# Patient Record
Sex: Female | Born: 1970 | Race: Black or African American | Hispanic: No | Marital: Single | Smoking: Former smoker
Health system: Southern US, Community
[De-identification: ages and names within clinical notes are randomized; demographics above are authoritative.]

## PROBLEM LIST (undated history)

## (undated) DIAGNOSIS — N938 Other specified abnormal uterine and vaginal bleeding: Secondary | ICD-10-CM

## (undated) DIAGNOSIS — I10 Essential (primary) hypertension: Secondary | ICD-10-CM

## (undated) DIAGNOSIS — Z800 Family history of malignant neoplasm of digestive organs: Secondary | ICD-10-CM

## (undated) DIAGNOSIS — Z923 Personal history of irradiation: Secondary | ICD-10-CM

## (undated) DIAGNOSIS — F419 Anxiety disorder, unspecified: Secondary | ICD-10-CM

## (undated) DIAGNOSIS — F32A Depression, unspecified: Secondary | ICD-10-CM

## (undated) DIAGNOSIS — Z9221 Personal history of antineoplastic chemotherapy: Secondary | ICD-10-CM

## (undated) DIAGNOSIS — F329 Major depressive disorder, single episode, unspecified: Secondary | ICD-10-CM

## (undated) HISTORY — DX: Depression, unspecified: F32.A

## (undated) HISTORY — DX: Anxiety disorder, unspecified: F41.9

## (undated) HISTORY — PX: LAPAROSCOPIC GASTRIC BANDING: SHX1100

## (undated) HISTORY — PX: FOOT SURGERY: SHX648

## (undated) HISTORY — PX: BREAST LUMPECTOMY: SHX2

## (undated) HISTORY — PX: WISDOM TOOTH EXTRACTION: SHX21

## (undated) HISTORY — PX: BREAST SURGERY: SHX581

## (undated) HISTORY — DX: Essential (primary) hypertension: I10

## (undated) HISTORY — DX: Family history of malignant neoplasm of digestive organs: Z80.0

## (undated) HISTORY — DX: Other specified abnormal uterine and vaginal bleeding: N93.8

---
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
## 1998-04-27 ENCOUNTER — Emergency Department (HOSPITAL_COMMUNITY): Admission: EM | Admit: 1998-04-27 | Discharge: 1998-04-27 | Payer: Self-pay | Admitting: Emergency Medicine

## 1999-02-03 ENCOUNTER — Ambulatory Visit (HOSPITAL_BASED_OUTPATIENT_CLINIC_OR_DEPARTMENT_OTHER): Admission: RE | Admit: 1999-02-03 | Discharge: 1999-02-03 | Payer: Self-pay | Admitting: Surgery

## 2001-05-11 ENCOUNTER — Other Ambulatory Visit: Admission: RE | Admit: 2001-05-11 | Discharge: 2001-05-11 | Payer: Self-pay | Admitting: Obstetrics and Gynecology

## 2002-06-01 ENCOUNTER — Other Ambulatory Visit: Admission: RE | Admit: 2002-06-01 | Discharge: 2002-06-01 | Payer: Self-pay | Admitting: Obstetrics and Gynecology

## 2003-07-12 ENCOUNTER — Other Ambulatory Visit: Admission: RE | Admit: 2003-07-12 | Discharge: 2003-07-12 | Payer: Self-pay | Admitting: Obstetrics and Gynecology

## 2004-07-14 ENCOUNTER — Other Ambulatory Visit: Admission: RE | Admit: 2004-07-14 | Discharge: 2004-07-14 | Payer: Self-pay | Admitting: Obstetrics and Gynecology

## 2005-07-15 ENCOUNTER — Other Ambulatory Visit: Admission: RE | Admit: 2005-07-15 | Discharge: 2005-07-15 | Payer: Self-pay | Admitting: Obstetrics and Gynecology

## 2006-09-09 ENCOUNTER — Other Ambulatory Visit: Admission: RE | Admit: 2006-09-09 | Discharge: 2006-09-09 | Payer: Self-pay | Admitting: Obstetrics and Gynecology

## 2007-09-13 ENCOUNTER — Other Ambulatory Visit: Admission: RE | Admit: 2007-09-13 | Discharge: 2007-09-13 | Payer: Self-pay | Admitting: Obstetrics and Gynecology

## 2008-12-26 ENCOUNTER — Other Ambulatory Visit: Admission: RE | Admit: 2008-12-26 | Discharge: 2008-12-26 | Payer: Self-pay | Admitting: Obstetrics and Gynecology

## 2008-12-26 ENCOUNTER — Encounter: Payer: Self-pay | Admitting: Obstetrics and Gynecology

## 2008-12-26 ENCOUNTER — Ambulatory Visit: Payer: Self-pay | Admitting: Obstetrics and Gynecology

## 2009-02-21 ENCOUNTER — Ambulatory Visit: Payer: Self-pay | Admitting: Obstetrics and Gynecology

## 2009-03-06 ENCOUNTER — Ambulatory Visit: Payer: Self-pay | Admitting: Obstetrics and Gynecology

## 2010-02-11 ENCOUNTER — Ambulatory Visit: Payer: Self-pay | Admitting: Obstetrics and Gynecology

## 2010-02-11 ENCOUNTER — Other Ambulatory Visit: Admission: RE | Admit: 2010-02-11 | Discharge: 2010-02-11 | Payer: Self-pay | Admitting: Obstetrics and Gynecology

## 2010-05-13 ENCOUNTER — Ambulatory Visit: Payer: Self-pay | Admitting: Gynecology

## 2010-12-06 ENCOUNTER — Encounter: Payer: Self-pay | Admitting: Gynecology

## 2011-05-13 ENCOUNTER — Encounter: Payer: Self-pay | Admitting: Obstetrics and Gynecology

## 2011-05-26 ENCOUNTER — Other Ambulatory Visit: Payer: Self-pay | Admitting: Obstetrics and Gynecology

## 2011-05-26 ENCOUNTER — Other Ambulatory Visit (HOSPITAL_COMMUNITY)
Admission: RE | Admit: 2011-05-26 | Discharge: 2011-05-26 | Disposition: A | Discharge status: Home / Self Care | Payer: BC Managed Care – PPO | Source: Ambulatory Visit | Attending: Obstetrics and Gynecology | Admitting: Obstetrics and Gynecology

## 2011-05-26 ENCOUNTER — Encounter (INDEPENDENT_AMBULATORY_CARE_PROVIDER_SITE_OTHER): Payer: BC Managed Care – PPO | Admitting: Obstetrics and Gynecology

## 2011-05-26 DIAGNOSIS — R823 Hemoglobinuria: Secondary | ICD-10-CM

## 2011-05-26 DIAGNOSIS — Z13220 Encounter for screening for lipoid disorders: Secondary | ICD-10-CM

## 2011-05-26 DIAGNOSIS — Z01419 Encounter for gynecological examination (general) (routine) without abnormal findings: Secondary | ICD-10-CM

## 2011-05-26 DIAGNOSIS — Z124 Encounter for screening for malignant neoplasm of cervix: Secondary | ICD-10-CM | POA: Insufficient documentation

## 2012-04-11 ENCOUNTER — Encounter: Payer: Self-pay | Admitting: Gynecology

## 2012-04-11 DIAGNOSIS — N938 Other specified abnormal uterine and vaginal bleeding: Secondary | ICD-10-CM | POA: Insufficient documentation

## 2012-04-12 ENCOUNTER — Ambulatory Visit: Payer: BC Managed Care – PPO | Admitting: Obstetrics and Gynecology

## 2012-05-29 ENCOUNTER — Encounter: Payer: Self-pay | Admitting: Gynecology

## 2012-05-29 ENCOUNTER — Ambulatory Visit (INDEPENDENT_AMBULATORY_CARE_PROVIDER_SITE_OTHER): Payer: BC Managed Care – PPO | Admitting: Gynecology

## 2012-05-29 ENCOUNTER — Telehealth: Payer: Self-pay | Admitting: *Deleted

## 2012-05-29 VITALS — BP 148/90

## 2012-05-29 DIAGNOSIS — N6310 Unspecified lump in the right breast, unspecified quadrant: Secondary | ICD-10-CM | POA: Insufficient documentation

## 2012-05-29 DIAGNOSIS — N63 Unspecified lump in unspecified breast: Secondary | ICD-10-CM

## 2012-05-29 DIAGNOSIS — I10 Essential (primary) hypertension: Secondary | ICD-10-CM | POA: Insufficient documentation

## 2012-05-29 NOTE — Telephone Encounter (Signed)
Message copied by Aura Camps on Mon May 29, 2012 12:49 PM ------      Message from: Ok Edwards      Created: Mon May 29, 2012 10:33 AM       Victorino Dike, please schedule a diagnostic mammogram possible ultrasound of right breast mass. See my last entry notes from today's encounter for details. Patient awaiting call with time for appointment. Patient will prefer a.m. appointment.

## 2012-05-29 NOTE — Progress Notes (Signed)
Patient is a 41 year old who presented to the office today stating that a few days ago she noticed a right breast lump. Patient many years ago had a right breast biopsy which was benign. She's currently with a Mirena IUD for contraception and for menstrual control. She stated she had a mammogram early part of this year by one of the mobile units that came to her work and was informed that her mammogram was benign. Patient denies any family history of any breast malignancy. She is overweight. Exam:  Physical Exam  Pulmonary/Chest:     Both breasts were examined in sitting and supine position. Breasts were pendulous. Previous biopsy site on the inferior portion of the right areolar region was noted. Palpable mass 5 finger breast from the right areolar region (11:00 position) of the right breast 1-1/2 cm in size mobile slightly tender. Contralateral breast with no palpable masses or tenderness. There was no supraclavicular axillary lymphadenopathy.  Assessment/plan: Patient with a right breast lump will be sent for diagnostic mammogram and possible ultrasound. Patient was instructed on the appropriate time to examine her breasts which would be the week after menses.

## 2012-05-29 NOTE — Telephone Encounter (Signed)
SPOKE WITH [facility] AND WAS TOLD TO PLACE ORDER AND THEY WILL CALL PT AND SET UP APPOINTMENT. ORDERS PLACED

## 2012-06-09 ENCOUNTER — Other Ambulatory Visit: Payer: BC Managed Care – PPO

## 2012-06-14 ENCOUNTER — Other Ambulatory Visit: Payer: BC Managed Care – PPO

## 2012-06-15 ENCOUNTER — Ambulatory Visit
Admission: RE | Admit: 2012-06-15 | Discharge: 2012-06-15 | Disposition: A | Discharge status: Home / Self Care | Payer: BC Managed Care – PPO | Source: Ambulatory Visit | Attending: Gynecology | Admitting: Gynecology

## 2012-06-15 DIAGNOSIS — N6310 Unspecified lump in the right breast, unspecified quadrant: Secondary | ICD-10-CM

## 2012-06-28 ENCOUNTER — Encounter: Payer: BC Managed Care – PPO | Admitting: Obstetrics and Gynecology

## 2012-07-26 ENCOUNTER — Other Ambulatory Visit (HOSPITAL_COMMUNITY)
Admission: RE | Admit: 2012-07-26 | Discharge: 2012-07-26 | Disposition: A | Discharge status: Home / Self Care | Payer: BC Managed Care – PPO | Source: Ambulatory Visit | Attending: Obstetrics and Gynecology | Admitting: Obstetrics and Gynecology

## 2012-07-26 ENCOUNTER — Encounter: Payer: Self-pay | Admitting: Obstetrics and Gynecology

## 2012-07-26 ENCOUNTER — Ambulatory Visit (INDEPENDENT_AMBULATORY_CARE_PROVIDER_SITE_OTHER): Payer: BC Managed Care – PPO | Admitting: Obstetrics and Gynecology

## 2012-07-26 VITALS — BP 126/84 | Ht 67.0 in | Wt 254.0 lb

## 2012-07-26 DIAGNOSIS — Z01419 Encounter for gynecological examination (general) (routine) without abnormal findings: Secondary | ICD-10-CM | POA: Insufficient documentation

## 2012-07-26 NOTE — Progress Notes (Signed)
Patient came to see me today for her annual GYN exam. She is very happy with her Mirena IUD. He was placed in April 2010. She has very light cycles with it. She had menorrhagia previously. This July she came to the office with a breast mass in her right breast. Mammogram and ultrasound was consistent with a fibroadenoma. No biopsy was done. She is to return in 6 months for followup ultrasound. Patient does her lab through her PCP. 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 She has always had normal Pap smears since then. She has had several Pap smears where an endocervical component was not seen. Her last Pap smear was 2012. She is having no abnormal bleeding. She is having no pelvic pain.  Physical examination:Kim Julian Reil present. HEENT within normal limits. Neck: Thyroid not large. No masses. Supraclavicular nodes: not enlarged. Breasts: Examined in both sitting and lying  position. No skin changes and no masses in left breast. In right breast at 10:00 1 cm nodule where fibroadenoma was seen. Abdomen: Soft no guarding rebound or masses or hernia. Pelvic: External: Within normal limits. BUS: Within normal limits. Vaginal:within normal limits. Good estrogen effect. No evidence of cystocele rectocele or enterocele. Cervix: clean. IUD string visible.  Uterus: Normal size and shape. Adnexa: No masses. Rectovaginal exam: Confirmatory and negative. Extremities: Within normal limits.  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  Plan: Followup ultrasound of breast in 4 months. Pap done.The new Pap smear guidelines were discussed with the patient.

## 2012-07-26 NOTE — Patient Instructions (Signed)
Get followup  Ultrasound of right breast in 4 months.

## 2012-07-27 LAB — URINALYSIS W MICROSCOPIC + REFLEX CULTURE
Casts: NONE SEEN
Crystals: NONE SEEN
Glucose, UA: NEGATIVE mg/dL
Leukocytes, UA: NEGATIVE
Nitrite: NEGATIVE
Specific Gravity, Urine: 1.022 (ref 1.005–1.030)
Squamous Epithelial / LPF: NONE SEEN
pH: 6 (ref 5.0–8.0)

## 2012-12-15 ENCOUNTER — Other Ambulatory Visit: Payer: Self-pay | Admitting: *Deleted

## 2012-12-15 DIAGNOSIS — N63 Unspecified lump in unspecified breast: Secondary | ICD-10-CM

## 2012-12-20 ENCOUNTER — Other Ambulatory Visit: Payer: Self-pay | Admitting: Gynecology

## 2012-12-20 DIAGNOSIS — N63 Unspecified lump in unspecified breast: Secondary | ICD-10-CM

## 2013-01-15 ENCOUNTER — Other Ambulatory Visit: Payer: BC Managed Care – PPO

## 2013-09-05 ENCOUNTER — Ambulatory Visit
Admission: RE | Admit: 2013-09-05 | Discharge: 2013-09-05 | Disposition: A | Discharge status: Home / Self Care | Payer: BC Managed Care – PPO | Source: Ambulatory Visit | Attending: Gynecology | Admitting: Gynecology

## 2013-09-05 DIAGNOSIS — N63 Unspecified lump in unspecified breast: Secondary | ICD-10-CM

## 2013-09-20 ENCOUNTER — Encounter: Payer: Self-pay | Admitting: Gynecology

## 2013-10-04 ENCOUNTER — Ambulatory Visit (INDEPENDENT_AMBULATORY_CARE_PROVIDER_SITE_OTHER): Payer: BC Managed Care – PPO | Admitting: Gynecology

## 2013-10-04 ENCOUNTER — Encounter: Payer: Self-pay | Admitting: Gynecology

## 2013-10-04 VITALS — BP 138/92 | Ht 67.0 in | Wt 261.0 lb

## 2013-10-04 DIAGNOSIS — Z01419 Encounter for gynecological examination (general) (routine) without abnormal findings: Secondary | ICD-10-CM

## 2013-10-04 DIAGNOSIS — Z23 Encounter for immunization: Secondary | ICD-10-CM

## 2013-10-04 NOTE — Patient Instructions (Signed)
Influenza Vaccine (Flu Vaccine, Inactivated) 2013 2014 What You Need to Know WHY GET VACCINATED?  Influenza ("flu") is a contagious disease that spreads around the [place] every winter, usually between October and May.  Flu is caused by the influenza virus, and can be spread by coughing, sneezing, and close contact.  Anyone can get flu, but the risk of getting flu is highest among children. Symptoms come on suddenly and may last several days. They can include:  Fever or chills.  Sore throat.  Muscle aches.  Fatigue.  Cough.  Headache.  Runny or stuffy nose. Flu can make some people much sicker than others. These people include young children, people 65 and older, pregnant women, and people with certain health conditions such as heart, lung or kidney disease, or a weakened immune system. Flu vaccine is especially important for these people, and anyone in close contact with them. Flu can also lead to pneumonia, and make existing medical conditions worse. It can cause diarrhea and seizures in children. Each year thousands of people in the [place] die from flu, and many more are hospitalized. Flu vaccine is the best protection we have from flu and its complications. Flu vaccine also helps prevent spreading flu from person to person. INACTIVATED FLU VACCINE There are 2 types of influenza vaccine:  You are getting an inactivated flu vaccine, which does not contain any live influenza virus. It is given by injection with a needle, and often called the "flu shot."  A different live, attenuated (weakened) influenza vaccine is sprayed into the nostrils. This vaccine is described in a separate Vaccine Information Statement. Flu vaccine is recommended every year. Children 6 months through 8 years of age should get 2 doses the first year they get vaccinated. Flu viruses are always changing. Each year's flu vaccine is made to protect from viruses that are most likely to cause disease  that year. While flu vaccine cannot prevent all cases of flu, it is our best defense against the disease. Inactivated flu vaccine protects against 3 or 4 different influenza viruses. It takes about 2 weeks for protection to develop after the vaccination, and protection lasts several months to a year. Some illnesses that are not caused by influenza virus are often mistaken for flu. Flu vaccine will not prevent these illnesses. It can only prevent influenza. A "high-dose" flu vaccine is available for people 65 years of age and older. The person giving you the vaccine can tell you more about it. Some inactivated flu vaccine contains a very small amount of a mercury-based preservative called thimerosal. Studies have shown that thimerosal in vaccines is not harmful, but flu vaccines that do not contain a preservative are available. SOME PEOPLE SHOULD NOT GET THIS VACCINE Tell the person who gives you the vaccine:  If you have any severe (life-threatening) allergies. If you ever had a life-threatening allergic reaction after a dose of flu vaccine, or have a severe allergy to any part of this vaccine, you may be advised not to get a dose. Most, but not all, types of flu vaccine contain a small amount of egg.  If you ever had Guillain Barr Syndrome (a severe paralyzing illness, also called GBS). Some people with a history of GBS should not get this vaccine. This should be discussed with your doctor.  If you are not feeling well. They might suggest waiting until you feel better. But you should come back. RISKS OF A VACCINE REACTION With a vaccine, like any medicine, there   is a chance of side effects. These are usually mild and go away on their own. Serious side effects are also possible, but are very rare. Inactivated flu vaccine does not contain live flu virus, sogetting flu from this vaccine is not possible. Brief fainting spells and related symptoms (such as jerking movements) can happen after any medical  procedure, including vaccination. Sitting or lying down for about 15 minutes after a vaccination can help prevent fainting and injuries caused by falls. Tell your doctor if you feel dizzy or lightheaded, or have vision changes or ringing in the ears. Mild problems following inactivated flu vaccine:  Soreness, redness, or swelling where the shot was given.  Hoarseness; sore, red or itchy eyes; or cough.  Fever.  Aches.  Headache.  Itching.  Fatigue. If these problems occur, they usually begin soon after the shot and last 1 or 2 days. Moderate problems following inactivated flu vaccine:  Young children who get inactivated flu vaccine and pneumococcal vaccine (PCV13) at the same time may be at increased risk for seizures caused by fever. Ask your doctor for more information. Tell your doctor if a child who is getting flu vaccine has ever had a seizure. Severe problems following inactivated flu vaccine:  A severe allergic reaction could occur after any vaccine (estimated less than 1 in a million doses).  There is a small possibility that inactivated flu vaccine could be associated with Guillan Barr Syndrome (GBS), no more than 1 or 2 cases per million people vaccinated. This is much lower than the risk of severe complications from flu, which can be prevented by flu vaccine. The safety of vaccines is always being monitored. For more information, visit: http://floyd.org/ WHAT IF THERE IS A SERIOUS REACTION? What should I look for?  Look for anything that concerns you, such as signs of a severe allergic reaction, very high fever, or behavior changes. Signs of a severe allergic reaction can include hives, swelling of the face and throat, difficulty breathing, a fast heartbeat, dizziness, and weakness. These would start a few minutes to a few hours after the vaccination. What should I do?  If you think it is a severe allergic reaction or other emergency that cannot wait, call 9 1 1   or get the person to the nearest hospital. Otherwise, call your doctor.  Afterward, the reaction should be reported to the Vaccine Adverse Event Reporting System (VAERS). Your doctor might file this report, or you can do it yourself through the VAERS website at www.vaers.LAgents.no, or by calling 1-6150276082. VAERS is only for reporting reactions. They do not give medical advice. THE NATIONAL VACCINE INJURY COMPENSATION PROGRAM The National Vaccine Injury Compensation Program (VICP) is a federal program that was created to compensate people who may have been injured by certain vaccines. Persons who believe they may have been injured by a vaccine can learn about the program and about filing a claim by calling 1-414-595-1353 or visiting the VICP website at SpiritualWord.at HOW CAN I LEARN MORE?  Ask your doctor.  Call your local or state health department.  Contact the Centers for Disease Control and Prevention (CDC):  Call 240-707-5093 (1-800-CDC-INFO) or  Visit CDC's website at BiotechRoom.com.cy CDC Inactivated Influenza Vaccine Interim VIS (06/09/12) Document Released: 08/26/2006 Document Revised: 07/26/2012 Document Reviewed: 07/04/2012 [facility] Patient Information 2014 [place], [place].  Tetanus, Diphtheria (Td) Vaccine What You Need to Know WHY GET VACCINATED? Tetanus  and diphtheria are very serious diseases. They are rare in the [place] today, but people who  do become infected often have severe complications. Td vaccine is used to protect adolescents and adults from both of these diseases. Both tetanus and diphtheria are infections caused by bacteria. Diphtheria spreads from person to person through coughing or sneezing. Tetanus-causing bacteria enter the body through cuts, scratches, or wounds. TETANUS (Lockjaw) causes painful muscle tightening and stiffness, usually all over the body.  It can lead to tightening of muscles in the head and neck so you can't open  your mouth, swallow, or sometimes even breathe. Tetanus kills about 1 out of every 5 people who are infected. DIPHTHERIA can cause a thick coating to form in the back of the throat.  It can lead to breathing problems, paralysis, heart failure, and death. Before vaccines, the [place] saw as many as 200,000 cases a year of diphtheria and hundreds of cases of tetanus. Since vaccination began, cases of both diseases have dropped by about 99%. TD VACCINE Td vaccine can protect adolescents and adults from tetanus and diphtheria. Td is usually given as a booster dose every 10 years but it can also be given earlier after a severe and dirty wound or burn. Your doctor can give you more information. Td may safely be given at the same time as other vaccines. SOME PEOPLE SHOULD NOT GET THIS VACCINE  If you ever had a life-threatening allergic reaction after a dose of any tetanus or diphtheria containing vaccine, OR if you have a severe allergy to any part of this vaccine, you should not get Td. Tell your doctor if you have any severe allergies.  Talk to your doctor if you:  have epilepsy or another nervous system problem,  had severe pain or swelling after any vaccine containing diphtheria or tetanus,  ever had Guillain Barr Syndrome (GBS),  aren't feeling well on the day the shot is scheduled. RISKS OF A VACCINE REACTION With a vaccine, like any medicine, there is a chance of side effects. These are usually mild and go away on their own. Serious side effects are also possible, but are very rare. Most people who get Td vaccine do not have any problems with it. Mild Problems  following Td (Did not interfere with activities)  Pain where the shot was given (about 8 people in 10)  Redness or swelling where the shot was given (about 1 person in 3)  Mild fever (about 1 person in 15)  Headache or Tiredness (uncommon) Moderate Problems following Td (Interfered with activities, but did not  require medical attention)  Fever over 102 F (38.9 C) (rare) Severe Problems  following Td (Unable to perform usual activities; required medical attention)  Swelling, severe pain, bleeding, or redness in the arm where the shot was given (rare). Problems that could happen after any vaccine:  Brief fainting spells can happen after any medical procedure, including vaccination. Sitting or lying down for about 15 minutes can help prevent fainting, and injuries caused by a fall. Tell your doctor if you feel dizzy, or have vision changes or ringing in the ears.  Severe shoulder pain and reduced range of motion in the arm where a shot was given can happen, very rarely, after a vaccination.  Severe allergic reactions from a vaccine are very rare, estimated at less than 1 in a million doses. If one were to occur, it would usually be within a few minutes to a few hours after the vaccination. WHAT IF THERE IS A SERIOUS REACTION? What should I look for?  Look for anything that  concerns you, such as signs of a severe allergic reaction, very high fever, or behavior changes. Signs of a severe allergic reaction can include hives, swelling of the face and throat, difficulty breathing, a fast heartbeat, dizziness, and weakness. These would usually start a few minutes to a few hours after the vaccination. What should I do?  If you think it is a severe allergic reaction or other emergency that can't wait, call 911 or get the person to the nearest hospital. Otherwise, call your doctor.  Afterward, the reaction should be reported to the Vaccine Adverse Event Reporting System (VAERS). Your doctor might file this report, or, you can do it yourself through the VAERS website or by calling 1-(684)088-1301. VAERS is only for reporting reactions. They do not give medical advice. THE NATIONAL VACCINE INJURY COMPENSATION PROGRAM The National Vaccine Injury Compensation Program (VICP) is a federal program that was created  to compensate people who may have been injured by certain vaccines. Persons who believe they may have been injured by a vaccine can learn about the program and about filing a claim by calling 1-(606)752-0045 or visiting the [facility] website. HOW CAN I LEARN MORE?  Ask your doctor.  Contact your local or state health department.  Contact the Centers for Disease Control and Prevention (CDC):  Call 253-228-5812 (1-800-CDC-INFO)  Visit CDC's vaccines website CDC Td Vaccine Interim VIS (12/19/12) Document Released: 08/29/2006 Document Revised: 02/26/2013 Document Reviewed: 02/21/2013 [facility] Patient Information 2014 [place], [place].

## 2013-10-04 NOTE — Progress Notes (Signed)
Paula Davidson 03-25-1971 [id number]   History:    42 y.o.  for annual gyn exam with no complaints today. Patient had a Mirena IUD placed in 2007. Patient had mammogram recently and is being followed as a result of her fibroadenomas. Patient is having normal cycles but very mild. Patient several years ago had mild dysplasia and had cryosurgery in 1991 of her cervix and her Pap smears since then have been normal. Her PCP is Dr. Elwyn Reach who is treating her for hypertension. Patient did not take her blood pressure medication today that's why her blood pressure today is 138/92. She is otherwise asymptomatic. Patient requesting flu vaccine today.  Past medical history,surgical history, family history and social history were all reviewed and documented in the EPIC chart.  Gynecologic History Patient's last menstrual period was 09/13/2013. Contraception: IUD Last Pap: 2013. Results were: normal Last mammogram: 2014. Results were: fibroadenomas  Obstetric History OB History  Gravida Para Term Preterm AB SAB TAB Ectopic Multiple Living  0                  ROS: A ROS was performed and pertinent positives and negatives are included in the history.  GENERAL: No fevers or chills. HEENT: No change in vision, no earache, sore throat or sinus congestion. NECK: No pain or stiffness. CARDIOVASCULAR: No chest pain or pressure. No palpitations. PULMONARY: No shortness of breath, cough or wheeze. GASTROINTESTINAL: No abdominal pain, nausea, vomiting or diarrhea, melena or bright red blood per rectum. GENITOURINARY: No urinary frequency, urgency, hesitancy or dysuria. MUSCULOSKELETAL: No joint or muscle pain, no back pain, no recent trauma. DERMATOLOGIC: No rash, no itching, no lesions. ENDOCRINE: No polyuria, polydipsia, no heat or cold intolerance. No recent change in weight. HEMATOLOGICAL: No anemia or easy bruising or bleeding. NEUROLOGIC: No headache, seizures, numbness, tingling or weakness. PSYCHIATRIC: No  depression, no loss of interest in normal activity or change in sleep pattern.     Exam: chaperone present  BP 138/92  Ht 5\' 7"  (1.702 m)  Wt 261 lb (118.389 kg)  BMI 40.87 kg/m2  LMP 09/13/2013  Body mass index is 40.87 kg/(m^2).  General appearance : Well developed well nourished female. No acute distress HEENT: Neck supple, trachea midline, no carotid bruits, no thyroidmegaly Lungs: Clear to auscultation, no rhonchi or wheezes, or rib retractions  Heart: Regular rate and rhythm, no murmurs or gallops Breast:Examined in sitting and supine position were symmetrical in appearance, no palpable masses or tenderness,  no skin retraction, no nipple inversion, no nipple discharge, no skin discoloration, no axillary or supraclavicular lymphadenopathy Abdomen: no palpable masses or tenderness, no rebound or guarding Extremities: no edema or skin discoloration or tenderness  Pelvic:  Bartholin, Urethra, Skene Glands: Within normal limits             Vagina: No gross lesions or discharge  Cervix: No gross lesions or discharge, IUD string seen  Uterus  retroverted, normal size, shape and consistency, non-tender and mobile  Adnexa  Without masses or tenderness  Anus and perineum  normal   Rectovaginal  normal sphincter tone without palpated masses or tenderness             Hemoccult none indicated     Assessment/Plan:  42 y.o. female for annual exam doing well with her Mirena IUD. Patient was to receive flu vaccine today. She was reminded to take her blood pressure medication in medially when she gets home. She is scheduled for her annual exam  with her primary physician in 2 weeks. I have asked her to maintain a log of her blood pressure readings twice a day to take whether to that office appointment. Her PCP will be drawn her blood work. Pap smear was not done today in accordance to the new guidelines. We discussed importance of regular exercise lower counts and vitamin D for osteoporosis  prevention. We also discussed importance of monthly self breast exams.  Note: This dictation was prepared with  Dragon/digital dictation along withSmart phrase technology. Any transcriptional errors that result from this process are unintentional.   Ok Edwards MD, 11:27 AM 10/04/2013

## 2013-10-04 NOTE — Addendum Note (Signed)
Addended by: Bertram Savin A on: 10/04/2013 11:45 AM   Modules accepted: Orders

## 2013-10-05 ENCOUNTER — Telehealth: Payer: Self-pay | Admitting: Gynecology

## 2013-10-05 NOTE — Telephone Encounter (Signed)
10/05/13-Pt was told that as of today her ins will cover the removal of old Mirena and insertion of new at 100%. Told her we would have to reck her benefits after first of year again as she has appt with JF for 04/15. wl

## 2013-11-22 DIAGNOSIS — J069 Acute upper respiratory infection, unspecified: Secondary | ICD-10-CM | POA: Insufficient documentation

## 2013-11-22 DIAGNOSIS — J029 Acute pharyngitis, unspecified: Secondary | ICD-10-CM | POA: Insufficient documentation

## 2014-02-19 ENCOUNTER — Ambulatory Visit: Payer: BC Managed Care – PPO | Admitting: Gynecology

## 2019-01-19 DIAGNOSIS — Z6841 Body Mass Index (BMI) 40.0 and over, adult: Secondary | ICD-10-CM | POA: Insufficient documentation

## 2019-01-19 DIAGNOSIS — F411 Generalized anxiety disorder: Secondary | ICD-10-CM | POA: Insufficient documentation

## 2019-01-29 DIAGNOSIS — Z9884 Bariatric surgery status: Secondary | ICD-10-CM | POA: Insufficient documentation

## 2019-08-21 ENCOUNTER — Other Ambulatory Visit: Payer: Self-pay | Admitting: *Deleted

## 2019-08-21 ENCOUNTER — Other Ambulatory Visit: Payer: Self-pay | Admitting: Obstetrics and Gynecology

## 2019-08-21 DIAGNOSIS — N6310 Unspecified lump in the right breast, unspecified quadrant: Secondary | ICD-10-CM

## 2019-08-21 DIAGNOSIS — N644 Mastodynia: Secondary | ICD-10-CM

## 2019-08-21 DIAGNOSIS — E559 Vitamin D deficiency, unspecified: Secondary | ICD-10-CM | POA: Insufficient documentation

## 2019-08-21 DIAGNOSIS — R7303 Prediabetes: Secondary | ICD-10-CM | POA: Insufficient documentation

## 2019-08-24 ENCOUNTER — Ambulatory Visit
Admission: RE | Admit: 2019-08-24 | Discharge: 2019-08-24 | Disposition: A | Discharge status: Home / Self Care | Payer: BLUE CROSS/BLUE SHIELD | Source: Ambulatory Visit | Attending: Obstetrics and Gynecology | Admitting: Obstetrics and Gynecology

## 2019-08-24 ENCOUNTER — Other Ambulatory Visit: Payer: Self-pay | Admitting: Obstetrics and Gynecology

## 2019-08-24 ENCOUNTER — Other Ambulatory Visit: Payer: Self-pay

## 2019-08-24 DIAGNOSIS — N6310 Unspecified lump in the right breast, unspecified quadrant: Secondary | ICD-10-CM

## 2019-08-24 DIAGNOSIS — R599 Enlarged lymph nodes, unspecified: Secondary | ICD-10-CM

## 2019-08-24 DIAGNOSIS — N644 Mastodynia: Secondary | ICD-10-CM

## 2019-08-28 ENCOUNTER — Other Ambulatory Visit: Payer: Self-pay

## 2019-08-28 ENCOUNTER — Ambulatory Visit
Admission: RE | Admit: 2019-08-28 | Discharge: 2019-08-28 | Disposition: A | Discharge status: Home / Self Care | Payer: BLUE CROSS/BLUE SHIELD | Source: Ambulatory Visit | Attending: Obstetrics and Gynecology | Admitting: Obstetrics and Gynecology

## 2019-08-28 DIAGNOSIS — N6310 Unspecified lump in the right breast, unspecified quadrant: Secondary | ICD-10-CM

## 2019-08-28 DIAGNOSIS — R599 Enlarged lymph nodes, unspecified: Secondary | ICD-10-CM

## 2019-08-30 ENCOUNTER — Telehealth: Payer: Self-pay | Admitting: Hematology

## 2019-08-30 NOTE — Telephone Encounter (Signed)
Spoke with patient to confirm afternoon [facility] appointment for 10/21, packet emailed to patient

## 2019-08-31 ENCOUNTER — Encounter: Payer: Self-pay | Admitting: *Deleted

## 2019-08-31 DIAGNOSIS — C50411 Malignant neoplasm of upper-outer quadrant of right female breast: Secondary | ICD-10-CM | POA: Insufficient documentation

## 2019-08-31 DIAGNOSIS — Z170 Estrogen receptor positive status [ER+]: Secondary | ICD-10-CM

## 2019-09-03 NOTE — Progress Notes (Signed)
Paula Davidson   [phone] Fax:([phone] Island Note   Patient Care Team: Burnett Sheng, MD as PCP - General (Family Medicine) Mauro Kaufmann, RN as Oncology Nurse Navigator Rockwell Germany, RN as Oncology Nurse Navigator Jovita Kussmaul, MD as Consulting Physician (General Surgery) Truitt Merle, MD as Consulting Physician (Hematology) Eppie Gibson, MD as Attending Physician (Radiation Oncology)  Date of Service:  09/05/2019   CHIEF COMPLAINTS/PURPOSE OF CONSULTATION:  Newly diagnosed Malignant neoplasm of upper-outer quadrant of right breast    Oncology History Overview Note  Cancer Staging Malignant neoplasm of upper-outer quadrant of right breast in female, estrogen receptor positive ([place]) Staging form: Breast, AJCC 8th Edition - Clinical stage from 08/28/2019: Stage IB (cT2, cN0, cM0, G2, ER+, PR+, HER2-) - Signed by Truitt Merle, MD on 09/04/2019    Malignant neoplasm of upper-outer quadrant of right breast in female, estrogen receptor positive ([place])  08/24/2019 Mammogram   Diagnostic mammogram and [place] 08/24/19 IMPRESSION: Suspicious right breast mass 10 o'clock 1 cm from the nipple measuring 3.3 x 1.9 x 1.9 cm and axillary adenopathy.   08/28/2019 Initial Biopsy   Diagnosis 08/28/19 1. Breast, right, needle core biopsy, 10 o'clock - INVASIVE MAMMARY CARCINOMA, GRADE II. - SEE MICROSCOPIC DESCRIPTION. 2. Lymph node, needle/core biopsy, right axilla - BENIGN LYMPH NODE. - NO METASTATIC CARCINOMA IDENTIFIED.    08/28/2019 Receptors her2   Results: IMMUNOHISTOCHEMICAL AND MORPHOMETRIC ANALYSIS PERFORMED MANUALLY The tumor cells are NEGATIVE for Her2 (1+). Estrogen Receptor: 100%, POSITIVE, STRONG STAINING INTENSITY Progesterone Receptor: 100%, POSITIVE, STRONG STAINING INTENSITY Proliferation Marker Ki67: 10%   08/28/2019 Cancer Staging   Staging form: Breast, AJCC 8th Edition - Clinical stage from 08/28/2019: Stage IB  (cT2, cN0, cM0, G2, ER+, PR+, HER2-) - Signed by Truitt Merle, MD on 09/04/2019   08/31/2019 Initial Diagnosis   Malignant neoplasm of upper-outer quadrant of right breast in female, estrogen receptor positive ([place])      HISTORY OF PRESENTING ILLNESS:  Paula Davidson 48 y.o. female is a here because of newly diagnosed right breast cancer. The patient Breast presents to the clinic today alone. She video called her family to be included in the visit today.   She notes she felt a right breast mass for 7-8 years. She notes she was told it was a 2cm Cyst in right breast in 08/2013. Since then she notes she was having yearly screening mammograms. She notes it grew, moved up in breast and eventually started to burn when laying on it. She was seen by Gyn but feels it was stable. She was recommended to go to Breast center, had a mammogram and [place] and biopsy which showed cancer. She also had prior right lumpectomy for benign mass.   Today she notes she is very anxious about her diagnosis and her treatment.  Socially she is single and has never been pregnant. 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 She notes she lives with a roommate. She lives in [place] but has family in [place] so she is here often. She notes she smoked for 5 years intermittently. She does not drink often.   They have a PMHx of HTN. Her MGM died from Pancreatic cancer in 4s. She had gastric bending by Dr. Volanda Napoleon which did not work. She plans to have another. She notes she has foot surgery. She notes she has Mirena for the past 5 years and did not have period since then. She notes  her periods were very heavy and did not stop. She was on IV iron due to this. She feels she has started hot flashes.     GYN HISTORY  Menarchal: 11 LMP: 5 years ago (IUD) Contraceptive: On IUD for the past 5 years  HRT: NA  G0    REVIEW OF SYSTEMS:    Constitutional: Denies fevers, chills or abnormal night sweats Eyes: Denies blurriness  of vision, double vision or watery eyes Ears, nose, mouth, throat, and face: Denies mucositis or sore throat Respiratory: Denies cough, dyspnea or wheezes Cardiovascular: Denies palpitation, chest discomfort or lower extremity swelling Gastrointestinal:  Denies nausea, heartburn or change in bowel habits Skin: Denies abnormal skin rashes Lymphatics: Denies new lymphadenopathy or easy bruising Neurological:Denies numbness, tingling or new weaknesses Behavioral/Psych: Mood is stable, no new changes  Breast: (+) Right breast mass, burning with pressure All other systems were reviewed with the patient and are negative.   MEDICAL HISTORY:  Past Medical History:  Diagnosis Date   Anxiety    Depression    DUB (dysfunctional uterine bleeding)    Hypertension     SURGICAL HISTORY: Past Surgical History:  Procedure Laterality Date   BREAST SURGERY     Breast lump excised   FOOT SURGERY     LAPAROSCOPIC GASTRIC BANDING      SOCIAL HISTORY: Social History   Socioeconomic History   Marital status: Single    Spouse name: Not on file   Number of children: Not on file   Years of education: Not on file   Highest education level: Not on file  Occupational History   Occupation: Librarian, academic   Social Needs   Financial resource strain: Not on file   Food insecurity    Worry: Not on file    Inability: Not on file   Transportation needs    Medical: Not on file    Non-medical: Not on file  Tobacco Use   Smoking status: Former Smoker    Years: 5.00    Quit date: 05/29/1997    Years since quitting: 22.2   Smokeless tobacco: Never Used  Substance and Sexual Activity   Alcohol use: Yes    Comment: OCC GLASS OF WINE   Drug use: No   Sexual activity: Yes    Birth control/protection: I.U.D.    Comment: MIRENA inserted 03-06-09  Lifestyle   Physical activity    Days per week: Not on file    Minutes per session: Not on file   Stress: Not on file    Relationships   Social connections    Talks on phone: Not on file    Gets together: Not on file    Attends religious service: Not on file    Active member of club or organization: Not on file    Attends meetings of clubs or organizations: Not on file    Relationship status: Not on file   Intimate partner violence    Fear of current or ex partner: Not on file    Emotionally abused: Not on file    Physically abused: Not on file    Forced sexual activity: Not on file  Other Topics Concern   Not on file  Social History Narrative   Not on file    FAMILY HISTORY: Family History  Problem Relation Age of Onset   Hypertension Mother    Stroke Mother    Hypertension Father    Cerebral palsy Brother    Cancer Maternal Grandmother 21  pancreatic cancer     ALLERGIES:  is allergic to codeine.  MEDICATIONS:  Current Outpatient Medications  Medication Sig Dispense Refill   amLODipine (NORVASC) 5 MG tablet Take 5 mg by mouth daily.     Calcium Carbonate-Vitamin D (CALCIUM + D PO) Take by mouth.     citalopram (CELEXA) 10 MG tablet Take 10 mg by mouth daily.     Fexofenadine HCl (ALLEGRA PO) Take by mouth.     levonorgestrel (MIRENA) 20 MCG/24HR IUD 1 each by Intrauterine route once.     LOSARTAN POTASSIUM PO Take by mouth.     metoprolol-hydrochlorothiazide (LOPRESSOR HCT) 50-25 MG per tablet Take 1 tablet by mouth daily.     Multiple Vitamin (MULTIVITAMIN) tablet Take 1 tablet by mouth daily.     No current facility-administered medications for this visit.     PHYSICAL EXAMINATION: ECOG PERFORMANCE STATUS: 0 - Asymptomatic  Vitals:   09/05/19 1259  BP: (!) 166/76  Pulse: 83  Resp: 20  Temp: 98.7 F (37.1 C)  SpO2: 100%   Filed Weights   09/05/19 1259  Weight: 293 lb 3.2 oz (133 kg)    GENERAL:alert, no distress and comfortable SKIN: skin color, texture, turgor are normal, no rashes or significant lesions EYES: normal, Conjunctiva are pink and  non-injected, sclera clear  NECK: supple, thyroid normal size, non-tender, without nodularity LYMPH:  no palpable lymphadenopathy in the cervical, axillary  LUNGS: clear to auscultation and percussion with normal breathing effort HEART: regular rate & rhythm and no murmurs and no lower extremity edema ABDOMEN:abdomen soft, non-tender and normal bowel sounds Musculoskeletal:no cyanosis of digits and no clubbing  NEURO: alert & oriented x 3 with fluent speech, no focal motor/sensory deficits BREAST: S/p past right lumpectomy (+)Right breast skin ecchymosis. (+) 3X4cm  cm palpable lump of right breast in upper outer quadrant close to nipple. No adenopathy bilaterally. Left breast exam benign.  LABORATORY DATA:  I have reviewed the data as listed CBC Latest Ref Rng & Units 09/05/2019  WBC 4.0 - 10.5 K/uL 6.2  Hemoglobin 12.0 - 15.0 g/dL 12.1  Hematocrit 36.0 - 46.0 % 37.4  Platelets 150 - 400 K/uL 442(H)    CMP Latest Ref Rng & Units 09/05/2019  Glucose 70 - 99 mg/dL 115(H)  BUN 6 - 20 mg/dL 9  Creatinine 0.44 - 1.00 mg/dL 0.76  Sodium 135 - 145 mmol/L 142  Potassium 3.5 - 5.1 mmol/L 3.7  Chloride 98 - 111 mmol/L 106  CO2 22 - 32 mmol/L 24  Calcium 8.9 - 10.3 mg/dL 9.6  Total Protein 6.5 - 8.1 g/dL 8.0  Total Bilirubin 0.3 - 1.2 mg/dL 0.4  Alkaline Phos 38 - 126 U/L 63  AST 15 - 41 U/L 14(L)  ALT 0 - 44 U/L 17     RADIOGRAPHIC STUDIES: I have personally reviewed the radiological images as listed and agreed with the findings in the report. US Breast Ltd Uni Right Inc Axilla  Result Date: 08/24/2019 CLINICAL DATA:  Patient complains of an enlarging palpable mass in the right breast. Patient had a recent normal screening mammogram on 07/25/2019 from [place]. EXAM: DIGITAL DIAGNOSTIC RIGHT MAMMOGRAM WITH TOMO ULTRASOUND RIGHT BREAST COMPARISON:  Previous exam(s). ACR Breast Density Category d: The breast tissue is extremely dense, which lowers the sensitivity of mammography.  FINDINGS: Spot tangential view of the right breast was performed. There is a partially imaged mass in the upper aspect of the right breast. Mammographic images were processed with CAD.  On physical exam, I palpate a discrete mobile mass in the right breast at 10 o'clock 1 cm from the nipple. Targeted ultrasound is performed, showing a hypoechoic mass in the right breast at 10 o'clock 1 cm from the nipple measuring 3.3 x 1.9 x 1.9 cm. Sonographic evaluation of the right axilla shows 2 lymph nodes with prominent cortices measuring 4 and 6 mm. IMPRESSION: Suspicious right breast mass and axillary adenopathy. RECOMMENDATION: Ultrasound-guided core biopsies the right breast mass and a right axillary lymph node is recommended. I have discussed the findings and recommendations with the patient. If applicable, a reminder letter will be sent to the patient regarding the next appointment. BI-RADS CATEGORY  4: Suspicious. Electronically Signed   By: Lillia Mountain M.D.   On: 08/24/2019 10:55   Mm Diag Breast Tomo Uni Right  Result Date: 08/24/2019 CLINICAL DATA:  Patient complains of an enlarging palpable mass in the right breast. Patient had a recent normal screening mammogram on 07/25/2019 from [place]. EXAM: DIGITAL DIAGNOSTIC RIGHT MAMMOGRAM WITH TOMO ULTRASOUND RIGHT BREAST COMPARISON:  Previous exam(s). ACR Breast Density Category d: The breast tissue is extremely dense, which lowers the sensitivity of mammography. FINDINGS: Spot tangential view of the right breast was performed. There is a partially imaged mass in the upper aspect of the right breast. Mammographic images were processed with CAD. On physical exam, I palpate a discrete mobile mass in the right breast at 10 o'clock 1 cm from the nipple. Targeted ultrasound is performed, showing a hypoechoic mass in the right breast at 10 o'clock 1 cm from the nipple measuring 3.3 x 1.9 x 1.9 cm. Sonographic evaluation of the right axilla shows 2 lymph nodes with  prominent cortices measuring 4 and 6 mm. IMPRESSION: Suspicious right breast mass and axillary adenopathy. RECOMMENDATION: Ultrasound-guided core biopsies the right breast mass and a right axillary lymph node is recommended. I have discussed the findings and recommendations with the patient. If applicable, a reminder letter will be sent to the patient regarding the next appointment. BI-RADS CATEGORY  4: Suspicious. Electronically Signed   By: Lillia Mountain M.D.   On: 08/24/2019 10:55   [place] Axillary Node Core Biopsy Right  Addendum Date: 08/29/2019   ADDENDUM REPORT: 08/29/2019 12:04 ADDENDUM: Pathology revealed GRADE II INVASIVE MAMMARY CARCINOMA of the Right breast. This was found to be concordant by Dr. Curlene Dolphin. Pathology revealed BENIGN LYMPH NODE of the Right axilla. This was found to be concordant by Dr. Curlene Dolphin. Pathology results were discussed with the patient by telephone. The patient reported doing well after the biopsies with tenderness at the sites. Post biopsy instructions and care were reviewed and questions were answered. The patient was encouraged to call [facility] for any additional concerns. The patient was referred to [facility] on September 05, 2019. Recommendation for a bilateral breast MRI for further evaluation of extent of disease and extremely dense breasts. Pathology results reported by Terie Purser, RN on 08/29/2019. Electronically Signed   By: Curlene Dolphin M.D.   On: 08/29/2019 12:04   Result Date: 08/29/2019 CLINICAL DATA:  Ultrasound-guided core needle biopsy was recommended of 1 of the patient's right axillary lymph nodes with mild cortical thickening. EXAM: [place] AXILLARY NODE CORE BIOPSY RIGHT COMPARISON:  Previous exam(s). FINDINGS: I met with the patient and we discussed the procedure of ultrasound-guided biopsy, including benefits and alternatives. We discussed the  high likelihood of  a successful procedure. We discussed the risks of the procedure, including infection, bleeding, tissue injury, clip migration, and inadequate sampling. Informed written consent was given. The usual time-out protocol was performed immediately prior to the procedure. Using sterile technique and 1% Lidocaine as local anesthetic, under direct ultrasound visualization, a 14 gauge spring-loaded device was used to perform biopsy of a right axillary lymph node using a lateral approach. At the conclusion of the procedure [facility] tissue marker clip was deployed into the biopsy cavity. Follow up 2 view mammogram was performed and dictated separately. IMPRESSION: Ultrasound guided biopsy of right axilla. No apparent complications. Electronically Signed: By: Curlene Dolphin M.D. On: 08/28/2019 08:37   Mm Clip Placement Right  Result Date: 08/28/2019 CLINICAL DATA:  Ultrasound-guided core needle biopsies were performed of a palpable mass in the 10 o'clock retroareolar right breast and of a right axillary lymph node. EXAM: DIAGNOSTIC RIGHT MAMMOGRAM POST ULTRASOUND BIOPSIES COMPARISON:  Previous exam(s). FINDINGS: Mammographic images were obtained following ultrasound guided biopsy of palpable right breast mass and a right axillary lymph node with mild thickening. The ribbon shaped biopsy marking clip is in the expected location of the biopsied right breast mass. A HydroMARK biopsy clip is satisfactorily positioned within a right axillary lymph node. IMPRESSION: Appropriate positioning of the ribbon shaped biopsy marking clip at the site of biopsy in the right breast. Appropriate position of HydroMARK biopsy clip in right axilla. Final Assessment: Post Procedure Mammograms for Marker Placement Electronically Signed   By: Curlene Dolphin M.D.   On: 08/28/2019 08:43   [place] Rt Breast Bx W Loc Dev 1st Lesion Img Bx Spec US Guide  Addendum Date: 08/29/2019   ADDENDUM REPORT: 08/29/2019 12:04 ADDENDUM: Pathology  revealed GRADE II INVASIVE MAMMARY CARCINOMA of the Right breast. This was found to be concordant by Dr. Curlene Dolphin. Pathology revealed BENIGN LYMPH NODE of the Right axilla. This was found to be concordant by Dr. Curlene Dolphin. Pathology results were discussed with the patient by telephone. The patient reported doing well after the biopsies with tenderness at the sites. Post biopsy instructions and care were reviewed and questions were answered. The patient was encouraged to call [facility] for any additional concerns. The patient was referred to [facility] on September 05, 2019. Recommendation for a bilateral breast MRI for further evaluation of extent of disease and extremely dense breasts. Pathology results reported by Terie Purser, RN on 08/29/2019. Electronically Signed   By: Curlene Dolphin M.D.   On: 08/29/2019 12:04   Result Date: 08/29/2019 CLINICAL DATA:  Ultrasound-guided core needle biopsy was recommended of a palpable right breast mass in the 10 o'clock position 1 cm from the nipple. EXAM: ULTRASOUND GUIDED RIGHT BREAST CORE NEEDLE BIOPSY COMPARISON:  Previous exam(s). FINDINGS: I met with the patient and we discussed the procedure of ultrasound-guided biopsy, including benefits and alternatives. We discussed the high likelihood of a successful procedure. We discussed the risks of the procedure, including infection, bleeding, tissue injury, clip migration, and inadequate sampling. Informed written consent was given. The usual time-out protocol was performed immediately prior to the procedure. Lesion quadrant: Upper outer quadrant Using sterile technique and 1% Lidocaine as local anesthetic, under direct ultrasound visualization, a 12 gauge spring-loaded device was used to perform biopsy of a palpable 3.3 cm hypoechoic mass with internal vascularity using a lateral approach. At the conclusion of the  procedure ribbon tissue marker clip was  deployed into the biopsy cavity. Follow up 2 view mammogram was performed and dictated separately. IMPRESSION: Ultrasound guided biopsy of the right breast. No apparent complications. Electronically Signed: By: Curlene Dolphin M.D. On: 08/28/2019 08:36    ASSESSMENT & PLAN:  Paula Davidson is a 48 y.o. female with a history of anxiety, depression, HTN.   1 Malignant neoplasm of upper-outer quadrant of right breast, Stage IB, c(T2N0M0), ER/PR+, HER2-, Grade II -We discussed her image findings and the biopsy results in great details. She has had a right breast mass for 7-8 years. In 2014 her mammogram showed 2cm being cyst at same location.  -In the past year her right breast mass grew and began to burn with pressure, but was seen on screening mammogram due to dense breast tissue. Her [place] and Biopsy show grade II invasive mammary carcinoma of right breast and lymph node negative. Tumor is 3.6cm on [place]   -Given this was not seen on Mammogram and had enlarged lymph nodes, b/l breast MRI is recommended. She is agreeable. -if her MRI shows similar size of tumor and no other lesion, she is likely a candidate for lumpectomy with SLNB. She is agreeable with that. She was seen by Dr. Marlou Starks today and likely will proceed with surgery soon.  -I recommend a Oncotype Dx test on the surgical sample and we'll make a decision about adjuvant chemotherapy based on the Oncotype result. Written material of this test was given to her. She is young and fit, would be a good candidate for chemotherapy if her Oncotype recurrence score is high. -If her surgical sentinel lymph node positive, I recommend mammaprint for further risk stratification and guide adjuvant chemotherapy. -The risk of recurrence depends on the stage and biology of the tumor. She is early stage, with ER/PR positive and HER2 negative markers. I discussed this is the more common type of slow growing tumor.  -She was also seen by  radiation oncologist Dr. Isidore Moos today. If her surgical sentinel lymph nodes were negative, she would not need post mastectomy radiation. Otherwise radiation is recommended to reduce the risk for local recurrence.  -Given the strong ER and PR expression, I recommend adjuvant endocrine therapy with Tamoxifen or aromatase inhibitor (if she is postmenopausal) for a total of 5-10 years to reduce the risk of cancer recurrence. Potential benefits and side effects were discussed with patient and she is interested. -I recommend her to remove her Mirena due to her ER+ breast cancer, she is agreeable. She has not had menstrual period since Mirena was placed 5 years ago  -We also discussed the breast cancer surveillance after her surgery. She will continue annual screening mammogram, self exam, and a routine office visit with lab and exam with [place]. -I encouraged her to have healthy diet and exercise regularly -Her Physical exam showed 4.0cm mass of right breast. Labs reviewed, CBC and CMP WNL except plt 442K, BG 115, AST 14.  -Will f/u after surgery or radiation based on Oncotype.    2. Genetic Testing  -Given her young age and her MGM died from Pancreatic cancer she is eligible for genetic testing.  -She is interested, will proceed with testing today.    3. Menorrhagia  -She severe menorrhagia with long term bleeding. She previously required IV iron  -Her Gyn started Mirena 5 years ago which stopped her periods.  -Given her ER/PR positive breast cancer, I advised her to have her Mirena removed. She understands.  -I discussed given her age she 35  start menopause soon. Will test her hormonal level in the near future if she has no menstrual period after Mirena removal.    4. HTN, Depression, Anxiety, obesity  -On Amlodipine, Losartan, Lopressor -She notes her BP has been high since her mother died from her HTN. She is nervous today which is why her BP is elevated.  -She is also on Celexa.  -She notes she  lives with roommate.    PLAN:  -Breast MRI in 1-2 weeks  -She will proceed with surgery soon -Oncotype or Mammaprint on her surgical sample   -F/u after surgery or radiation   No orders of the defined types were placed in this encounter.   All questions were answered. The patient knows to call the clinic with any problems, questions or concerns. I spent 40 minutes counseling the patient face to face. The total time spent in the appointment was 50 minutes and more than 50% was on counseling.     Truitt Merle, MD 09/05/2019 2:52 PM  I, Joslyn Devon, am acting as scribe for Truitt Merle, MD.   I have reviewed the above documentation for accuracy and completeness, and I agree with the above.

## 2019-09-05 ENCOUNTER — Encounter: Payer: Self-pay | Admitting: Genetic Counselor

## 2019-09-05 ENCOUNTER — Other Ambulatory Visit: Payer: Self-pay | Admitting: *Deleted

## 2019-09-05 ENCOUNTER — Encounter: Payer: Self-pay | Admitting: Hematology

## 2019-09-05 ENCOUNTER — Encounter: Payer: Self-pay | Admitting: Physical Therapy

## 2019-09-05 ENCOUNTER — Ambulatory Visit (HOSPITAL_BASED_OUTPATIENT_CLINIC_OR_DEPARTMENT_OTHER): Payer: BLUE CROSS/BLUE SHIELD | Admitting: Genetic Counselor

## 2019-09-05 ENCOUNTER — Inpatient Hospital Stay: Payer: BLUE CROSS/BLUE SHIELD

## 2019-09-05 ENCOUNTER — Ambulatory Visit: Payer: Self-pay | Admitting: General Surgery

## 2019-09-05 ENCOUNTER — Encounter: Payer: Self-pay | Admitting: Radiation Oncology

## 2019-09-05 ENCOUNTER — Ambulatory Visit: Payer: BLUE CROSS/BLUE SHIELD | Attending: General Surgery | Admitting: Physical Therapy

## 2019-09-05 ENCOUNTER — Inpatient Hospital Stay: Payer: BLUE CROSS/BLUE SHIELD | Attending: Hematology | Admitting: Hematology

## 2019-09-05 ENCOUNTER — Other Ambulatory Visit: Payer: Self-pay

## 2019-09-05 ENCOUNTER — Ambulatory Visit
Admission: RE | Admit: 2019-09-05 | Discharge: 2019-09-05 | Disposition: A | Discharge status: Home / Self Care | Payer: BLUE CROSS/BLUE SHIELD | Source: Ambulatory Visit | Attending: Radiation Oncology | Admitting: Radiation Oncology

## 2019-09-05 VITALS — BP 166/76 | HR 83 | Temp 98.7°F | Resp 20 | Ht 68.0 in | Wt 293.2 lb

## 2019-09-05 DIAGNOSIS — Z170 Estrogen receptor positive status [ER+]: Secondary | ICD-10-CM

## 2019-09-05 DIAGNOSIS — N920 Excessive and frequent menstruation with regular cycle: Secondary | ICD-10-CM | POA: Diagnosis not present

## 2019-09-05 DIAGNOSIS — F419 Anxiety disorder, unspecified: Secondary | ICD-10-CM | POA: Insufficient documentation

## 2019-09-05 DIAGNOSIS — C50411 Malignant neoplasm of upper-outer quadrant of right female breast: Secondary | ICD-10-CM

## 2019-09-05 DIAGNOSIS — Z79899 Other long term (current) drug therapy: Secondary | ICD-10-CM | POA: Diagnosis not present

## 2019-09-05 DIAGNOSIS — R293 Abnormal posture: Secondary | ICD-10-CM | POA: Diagnosis present

## 2019-09-05 DIAGNOSIS — E669 Obesity, unspecified: Secondary | ICD-10-CM | POA: Diagnosis not present

## 2019-09-05 DIAGNOSIS — I10 Essential (primary) hypertension: Secondary | ICD-10-CM | POA: Diagnosis not present

## 2019-09-05 DIAGNOSIS — Z87891 Personal history of nicotine dependence: Secondary | ICD-10-CM | POA: Diagnosis not present

## 2019-09-05 DIAGNOSIS — F329 Major depressive disorder, single episode, unspecified: Secondary | ICD-10-CM | POA: Insufficient documentation

## 2019-09-05 DIAGNOSIS — F418 Other specified anxiety disorders: Secondary | ICD-10-CM | POA: Diagnosis not present

## 2019-09-05 DIAGNOSIS — Z800 Family history of malignant neoplasm of digestive organs: Secondary | ICD-10-CM

## 2019-09-05 LAB — CMP (CANCER CENTER ONLY)
ALT: 17 U/L (ref 0–44)
AST: 14 U/L — ABNORMAL LOW (ref 15–41)
Albumin: 3.9 g/dL (ref 3.5–5.0)
Alkaline Phosphatase: 63 U/L (ref 38–126)
Anion gap: 12 (ref 5–15)
BUN: 9 mg/dL (ref 6–20)
CO2: 24 mmol/L (ref 22–32)
Calcium: 9.6 mg/dL (ref 8.9–10.3)
Chloride: 106 mmol/L (ref 98–111)
Creatinine: 0.76 mg/dL (ref 0.44–1.00)
GFR, Est AFR Am: >60 mL/min (ref >60)
GFR, Estimated: >60 mL/min (ref >60)
Glucose, Bld: 115 mg/dL — ABNORMAL HIGH (ref 70–99)
Potassium: 3.7 mmol/L (ref 3.5–5.1)
Sodium: 142 mmol/L (ref 135–145)
Total Bilirubin: 0.4 mg/dL (ref 0.3–1.2)
Total Protein: 8 g/dL (ref 6.5–8.1)

## 2019-09-05 LAB — CBC WITH DIFFERENTIAL (CANCER CENTER ONLY)
Abs Immature Granulocytes: 0.02 10*3/uL (ref 0.00–0.07)
Basophils Absolute: 0.1 10*3/uL (ref 0.0–0.1)
Basophils Relative: 1 %
Eosinophils Absolute: 0.4 10*3/uL (ref 0.0–0.5)
Eosinophils Relative: 6 %
HCT: 37.4 % (ref 36.0–46.0)
Hemoglobin: 12.1 g/dL (ref 12.0–15.0)
Immature Granulocytes: 0 %
Lymphocytes Relative: 32 %
Lymphs Abs: 2 10*3/uL (ref 0.7–4.0)
MCH: 26.4 pg (ref 26.0–34.0)
MCHC: 32.4 g/dL (ref 30.0–36.0)
MCV: 81.5 fL (ref 80.0–100.0)
Monocytes Absolute: 0.4 10*3/uL (ref 0.1–1.0)
Monocytes Relative: 7 %
Neutro Abs: 3.3 10*3/uL (ref 1.7–7.7)
Neutrophils Relative %: 54 %
Platelet Count: 442 10*3/uL — ABNORMAL HIGH (ref 150–400)
RBC: 4.59 MIL/uL (ref 3.87–5.11)
RDW: 15.6 % — ABNORMAL HIGH (ref 11.5–15.5)
WBC Count: 6.2 10*3/uL (ref 4.0–10.5)
nRBC: 0 % (ref 0.0–0.2)

## 2019-09-05 NOTE — Therapy (Signed)
[place], [place], [address] Phone: [phone]   Fax:  [phone]  Physical Therapy Evaluation  Patient Details  Name: Paula Davidson MRN: [id number] Date of Birth: 06/09/1971 Referring Provider (PT): Dr. Autumn Messing   Encounter Date: 09/05/2019  PT End of Session - 09/05/19 1326    Visit Number  1    Number of Visits  2    Date for PT Re-Evaluation  10/31/19    PT Start Time  6063    PT Stop Time  1439    PT Time Calculation (min)  26 min    Activity Tolerance  Patient tolerated treatment well    Behavior During Therapy  [facility] for tasks assessed/performed       Past Medical History:  Diagnosis Date  . Anxiety   . Depression   . DUB (dysfunctional uterine bleeding)   . Hypertension     Past Surgical History:  Procedure Laterality Date  . BREAST SURGERY     Breast lump excised  . FOOT SURGERY    . LAPAROSCOPIC GASTRIC BANDING      There were no vitals filed for this visit.   Subjective Assessment - 09/05/19 1317    Subjective  Patient reports she is here today to be seen by her medical team for her newly diagnosed right breast cancer    Pertinent History  Patient was diagnosed on 07/25/2019 with right grade II invasive ductal carcinoma breast cancer. It measures 3.3 cm and is located in the upper outer quadrant. It is ER/PR positive and HER2 negative with a Ki67 of 10%.    Patient Stated Goals  Reduce lymphedema risk and learn post op shoulder ROM HEP    Currently in Pain?  No/denies         [facility] PT Assessment - 09/05/19 0001      Assessment   Medical Diagnosis  Right breast cancer    Referring Provider (PT)  Dr. Autumn Messing    Onset Date/Surgical Date  07/24/01    Hand Dominance  Right    Prior Therapy  none      Precautions   Precautions  Other (comment)    Precaution Comments  active cancer      Restrictions   Weight Bearing Restrictions  No      Balance Screen   Has the  patient fallen in the past 6 months  No    Has the patient had a decrease in activity level because of a fear of falling?   No    Is the patient reluctant to leave their home because of a fear of falling?   No      Home Environment   Living Environment  Private residence    Living Arrangements  Non-relatives/Friends   Roommate   Available Help at Discharge  Family      Prior Function   Level of Independence  Independent    Vocation  Full time employment    Manufacturing engineer    Leisure  She does not exercise      Cognition   Overall Cognitive Status  Within Functional Limits for tasks assessed      Posture/Postural Control   Posture/Postural Control  Postural limitations    Postural Limitations  Rounded Shoulders;Forward head      ROM / Strength   AROM / PROM / Strength  AROM;Strength      AROM   AROM Assessment Site  Shoulder    Right/Left Shoulder  Right;Left    Right Shoulder Extension  50 Degrees    Right Shoulder Flexion  164 Degrees    Right Shoulder ABduction  168 Degrees    Right Shoulder Internal Rotation  60 Degrees    Right Shoulder External Rotation  80 Degrees    Left Shoulder Extension  43 Degrees    Left Shoulder Flexion  160 Degrees    Left Shoulder ABduction  167 Degrees    Left Shoulder Internal Rotation  67 Degrees    Left Shoulder External Rotation  79 Degrees      Strength   Overall Strength  Within functional limits for tasks performed        LYMPHEDEMA/ONCOLOGY QUESTIONNAIRE - 09/05/19 1324      Type   Cancer Type  Right breast cancer      Lymphedema Assessments   Lymphedema Assessments  Upper extremities      Right Upper Extremity Lymphedema   10 cm Proximal to Olecranon Process  36.3 cm    Olecranon Process  29 cm    10 cm Proximal to Ulnar Styloid Process  24.5 cm    Just Proximal to Ulnar Styloid Process  18.1 cm    Across Hand at [employer]  21.8 cm    At [place] of 2nd Digit  7.2 cm      Left Upper  Extremity Lymphedema   10 cm Proximal to Olecranon Process  36.2 cm    Olecranon Process  28.5 cm    10 cm Proximal to Ulnar Styloid Process  24.3 cm    Just Proximal to Ulnar Styloid Process  17.8 cm    Across Hand at [employer]  21.4 cm    At [place] of 2nd Digit  7.1 cm          Quick Dash - 09/05/19 0001    Open a tight or new jar  No difficulty    Do heavy household chores (wash walls, wash floors)  No difficulty    Carry a shopping bag or briefcase  No difficulty    Wash your back  No difficulty    Use a knife to cut food  No difficulty    Recreational activities in which you take some force or impact through your arm, shoulder, or hand (golf, hammering, tennis)  No difficulty    During the past week, to what extent has your arm, shoulder or hand problem interfered with your normal social activities with family, friends, neighbors, or groups?  Not at all    During the past week, to what extent has your arm, shoulder or hand problem limited your work or other regular daily activities  Not at all    Arm, shoulder, or hand pain.  None    Tingling (pins and needles) in your arm, shoulder, or hand  None    Difficulty Sleeping  No difficulty    DASH Score  0 %        Objective measurements completed on examination: See above findings.       Patient was instructed today in a home exercise program today for post op shoulder range of motion. These included active assist shoulder flexion in sitting, scapular retraction, wall walking with shoulder abduction, and hands behind head external rotation.  She was encouraged to do these twice a day, holding 3 seconds and repeating 5 times when permitted by her physician.  PT Education - 09/05/19 1325    Education Details  Lymphedema risk reduction and post op shoulder ROM HEP    Person(s) Educated  Patient    Methods  Explanation;Demonstration;Handout    Comprehension  Returned demonstration;Verbalized understanding           PT Long Term Goals - 09/05/19 1331      PT LONG TERM GOAL #1   Title  Patient will demonstrate she has regained full shoulder ROM and function post operatively compared to baselines.    Time  8    Period  Weeks    Status  New    Target Date  10/31/19      Breast Clinic Goals - 09/05/19 1331      Patient will be able to verbalize understanding of pertinent lymphedema risk reduction practices relevant to her diagnosis specifically related to skin care.   Time  1    Period  Days    Status  Achieved      Patient will be able to return demonstrate and/or verbalize understanding of the post-op home exercise program related to regaining shoulder range of motion.   Time  1    Period  Days    Status  Achieved      Patient will be able to verbalize understanding of the importance of attending the postoperative After Breast Cancer Class for further lymphedema risk reduction education and therapeutic exercise.   Time  1    Period  Days    Status  Achieved            Plan - 09/05/19 1326    Clinical Impression Statement  Patient was diagnosed on 07/25/2019 with right grade II invasive ductal carcinoma breast cancer. It measures 3.3 cm and is located in the upper outer quadrant. It is ER/PR positive and HER2 negative with a Ki67 of 10%. Her multidisciplinary medical team met prior to her assessments to determine a recommended treatment plan. She is planning to have a right lumpectomy and sentinel node biopsy followed by Oncotype testing, radiation, and anti-estrogen therapy. She will benefit from a post op PT visit to reassess and determine needs.    Stability/Clinical Decision Making  Stable/Uncomplicated    Clinical Decision Making  Low    Rehab Potential  Excellent    PT Frequency  --   Eval and 1 f/u visit   PT Treatment/Interventions  ADLs/Self Care Home Management;Therapeutic exercise;Patient/family education    PT Next Visit Plan  Will reassess 3-4 weeks post op to  determine needs    PT Home Exercise Plan  Post op shoulder ROM HEP    Consulted and Agree with Plan of Care  Patient       Patient will benefit from skilled therapeutic intervention in order to improve the following deficits and impairments:  Postural dysfunction, Decreased range of motion, Decreased knowledge of precautions, Impaired UE functional use, Pain  Visit Diagnosis: Malignant neoplasm of upper-outer quadrant of right breast in female, estrogen receptor positive ([place]) - Plan: PT plan of care cert/re-cert  Abnormal posture - Plan: PT plan of care cert/re-cert   Patient will follow up at outpatient cancer rehab 3-4 weeks following surgery.  If the patient requires physical therapy at that time, a specific plan will be dictated and sent to the referring physician for approval. The patient was educated today on appropriate basic range of motion exercises to begin post operatively and the importance of attending the After Breast Cancer class following surgery.  Patient was educated today on lymphedema risk reduction practices as it pertains to recommendations that will benefit the patient immediately following surgery.  She verbalized good understanding.      Problem List Patient Active Problem List   Diagnosis Date Noted  . Malignant neoplasm of upper-outer quadrant of right breast in female, estrogen receptor positive ([place]) 08/31/2019  . Breast mass, right 05/29/2012  . HTN (hypertension) 05/29/2012  . DUB (dysfunctional uterine bleeding)    Annia Friendly, PT 09/05/19 3:08 PM  [place], [place], [address] Phone: [phone]   Fax:  [phone]  Name: Paula Davidson MRN: [id number] Date of Birth: June 29, 1971

## 2019-09-05 NOTE — Patient Instructions (Signed)

## 2019-09-05 NOTE — Progress Notes (Signed)
Radiation Oncology         [phone] ________________________________  Initial outpatient Consultation  Name: Paula Davidson MRN: [id number]  Date: 09/05/2019  DOB: 1971/06/21  CC:Burnett Sheng, MD  Jovita Kussmaul, MD   REFERRING PHYSICIAN: Autumn Messing III, MD  DIAGNOSIS:    ICD-10-CM   1. Malignant neoplasm of upper-outer quadrant of right breast in female, estrogen receptor positive ([place])  C50.411    Z17.0    Cancer Staging Malignant neoplasm of upper-outer quadrant of right breast in female, estrogen receptor positive ([place]) Staging form: Breast, AJCC 8th Edition - Clinical stage from 08/28/2019: Stage IB (cT2, cN0, cM0, G2, ER+, PR+, HER2-) - Signed by Truitt Merle, MD on 09/04/2019   CHIEF COMPLAINT: Here to discuss management of right breast cancer  HISTORY OF PRESENT ILLNESS::Paula Davidson is a 48 y.o. female who presented with breast abnormality on the following imaging: right diagnostic mammogram on the date of 08/24/2019.  Symptoms, if any, at that time, were: enlarging palpable right breast mass.  Screening mammogram in 07/2019 was negative.  Ultrasound of breast on 08/24/2019 revealed: 3.3 cm right breast mass at 10 o'clock; 2 lymph nodes with prominent cortices.   Biopsy on date of 08/28/2019 showed invasive mammary carcinoma, e-cadherin positive.  ER status: 100%; PR status 100%, Her2 status negative; Grade 2.  Biopsied lymph node was negative for carcinoma.  She has a history of a benign right breast mass, which was excised around 1998.  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  PREVIOUS RADIATION THERAPY: No  PAST MEDICAL HISTORY:  has a past medical history of Anxiety, Depression, DUB (dysfunctional uterine bleeding), and Hypertension.    PAST SURGICAL HISTORY: Past Surgical History:  Procedure Laterality Date   BREAST SURGERY     Breast lump excised   FOOT SURGERY     LAPAROSCOPIC GASTRIC BANDING      FAMILY HISTORY: family history includes Cancer  (age of onset: 29) in her maternal grandmother; Cerebral palsy in her brother; Hypertension in her father and mother; Stroke in her mother.  SOCIAL HISTORY:  reports that she quit smoking about 22 years ago. She quit after 5.00 years of use. She has never used smokeless tobacco. She reports current alcohol use. She reports that she does not use drugs.  ALLERGIES: Codeine  MEDICATIONS:  Current Outpatient Medications  Medication Sig Dispense Refill   amLODipine (NORVASC) 5 MG tablet Take 5 mg by mouth daily.     Calcium Carbonate-Vitamin D (CALCIUM + D PO) Take by mouth.     citalopram (CELEXA) 10 MG tablet Take 10 mg by mouth daily.     Fexofenadine HCl (ALLEGRA PO) Take by mouth.     levonorgestrel (MIRENA) 20 MCG/24HR IUD 1 each by Intrauterine route once.     LOSARTAN POTASSIUM PO Take by mouth.     metoprolol-hydrochlorothiazide (LOPRESSOR HCT) 50-25 MG per tablet Take 1 tablet by mouth daily.     Multiple Vitamin (MULTIVITAMIN) tablet Take 1 tablet by mouth daily.     No current facility-administered medications for this encounter.     REVIEW OF SYSTEMS: As above.   PHYSICAL EXAM:   Vitals:   09/05/19 1259  BP: (!) 166/76  Pulse: 83  Resp: 20  Temp: 98.7 F (37.1 C)  SpO2: 100%   Filed Weights   09/05/19 1259  Weight: 293 lb 3.2 oz (133 kg)   General: Alert and oriented, in no acute distress Psychiatric: Judgment and insight are  intact. Affect is appropriate. Breasts: 4 - 5cm mass,UOQ, right breast . No other palpable masses appreciated in the breasts or axillae bilaterally.   ECOG = 0  0 - Asymptomatic (Fully active, able to carry on all predisease activities without restriction)  1 - Symptomatic but completely ambulatory (Restricted in physically strenuous activity but ambulatory and able to carry out work of a light or sedentary nature. For example, light housework, office work)  2 - Symptomatic, <50% in bed during the day (Ambulatory and capable of all  self care but unable to carry out any work activities. Up and about more than 50% of waking hours)  3 - Symptomatic, >50% in bed, but not bedbound (Capable of only limited self-care, confined to bed or chair 50% or more of waking hours)  4 - Bedbound (Completely disabled. Cannot carry on any self-care. Totally confined to bed or chair)  5 - Death   Eustace Pen MM, Creech RH, Tormey DC, et al. [phone]). "Toxicity and response criteria of the [facility]". [place] Oncol. 5 (6): 649-55   LABORATORY DATA:  Lab Results  Component Value Date   WBC 6.2 09/05/2019   HGB 12.1 09/05/2019   HCT 37.4 09/05/2019   MCV 81.5 09/05/2019   PLT 442 (H) 09/05/2019   CMP     Component Value Date/Time   NA 142 09/05/2019 1223   K 3.7 09/05/2019 1223   CL 106 09/05/2019 1223   CO2 24 09/05/2019 1223   GLUCOSE 115 (H) 09/05/2019 1223   BUN 9 09/05/2019 1223   CREATININE 0.76 09/05/2019 1223   CALCIUM 9.6 09/05/2019 1223   PROT 8.0 09/05/2019 1223   ALBUMIN 3.9 09/05/2019 1223   AST 14 (L) 09/05/2019 1223   ALT 17 09/05/2019 1223   ALKPHOS 63 09/05/2019 1223   BILITOT 0.4 09/05/2019 1223   GFRNONAA >60 09/05/2019 1223   GFRAA >60 09/05/2019 1223         RADIOGRAPHY: US Breast Ltd Uni Right Inc Axilla  Result Date: 08/24/2019 CLINICAL DATA:  Patient complains of an enlarging palpable mass in the right breast. Patient had a recent normal screening mammogram on 07/25/2019 from [place]. EXAM: DIGITAL DIAGNOSTIC RIGHT MAMMOGRAM WITH TOMO ULTRASOUND RIGHT BREAST COMPARISON:  Previous exam(s). ACR Breast Density Category d: The breast tissue is extremely dense, which lowers the sensitivity of mammography. FINDINGS: Spot tangential view of the right breast was performed. There is a partially imaged mass in the upper aspect of the right breast. Mammographic images were processed with CAD. On physical exam, I palpate a discrete mobile mass in the right breast at 10 o'clock 1 cm  from the nipple. Targeted ultrasound is performed, showing a hypoechoic mass in the right breast at 10 o'clock 1 cm from the nipple measuring 3.3 x 1.9 x 1.9 cm. Sonographic evaluation of the right axilla shows 2 lymph nodes with prominent cortices measuring 4 and 6 mm. IMPRESSION: Suspicious right breast mass and axillary adenopathy. RECOMMENDATION: Ultrasound-guided core biopsies the right breast mass and a right axillary lymph node is recommended. I have discussed the findings and recommendations with the patient. If applicable, a reminder letter will be sent to the patient regarding the next appointment. BI-RADS CATEGORY  4: Suspicious. Electronically Signed   By: Lillia Mountain M.D.   On: 08/24/2019 10:55   Mm Diag Breast Tomo Uni Right  Result Date: 08/24/2019 CLINICAL DATA:  Patient complains of an enlarging palpable mass in the right breast. Patient had  a recent normal screening mammogram on 07/25/2019 from [place]. EXAM: DIGITAL DIAGNOSTIC RIGHT MAMMOGRAM WITH TOMO ULTRASOUND RIGHT BREAST COMPARISON:  Previous exam(s). ACR Breast Density Category d: The breast tissue is extremely dense, which lowers the sensitivity of mammography. FINDINGS: Spot tangential view of the right breast was performed. There is a partially imaged mass in the upper aspect of the right breast. Mammographic images were processed with CAD. On physical exam, I palpate a discrete mobile mass in the right breast at 10 o'clock 1 cm from the nipple. Targeted ultrasound is performed, showing a hypoechoic mass in the right breast at 10 o'clock 1 cm from the nipple measuring 3.3 x 1.9 x 1.9 cm. Sonographic evaluation of the right axilla shows 2 lymph nodes with prominent cortices measuring 4 and 6 mm. IMPRESSION: Suspicious right breast mass and axillary adenopathy. RECOMMENDATION: Ultrasound-guided core biopsies the right breast mass and a right axillary lymph node is recommended. I have discussed the findings and recommendations  with the patient. If applicable, a reminder letter will be sent to the patient regarding the next appointment. BI-RADS CATEGORY  4: Suspicious. Electronically Signed   By: Lillia Mountain M.D.   On: 08/24/2019 10:55   [place] Axillary Node Core Biopsy Right  Addendum Date: 08/29/2019   ADDENDUM REPORT: 08/29/2019 12:04 ADDENDUM: Pathology revealed GRADE II INVASIVE MAMMARY CARCINOMA of the Right breast. This was found to be concordant by Dr. Curlene Dolphin. Pathology revealed BENIGN LYMPH NODE of the Right axilla. This was found to be concordant by Dr. Curlene Dolphin. Pathology results were discussed with the patient by telephone. The patient reported doing well after the biopsies with tenderness at the sites. Post biopsy instructions and care were reviewed and questions were answered. The patient was encouraged to call [facility] for any additional concerns. The patient was referred to [facility] on September 05, 2019. Recommendation for a bilateral breast MRI for further evaluation of extent of disease and extremely dense breasts. Pathology results reported by Terie Purser, RN on 08/29/2019. Electronically Signed   By: Curlene Dolphin M.D.   On: 08/29/2019 12:04   Result Date: 08/29/2019 CLINICAL DATA:  Ultrasound-guided core needle biopsy was recommended of 1 of the patient's right axillary lymph nodes with mild cortical thickening. EXAM: [place] AXILLARY NODE CORE BIOPSY RIGHT COMPARISON:  Previous exam(s). FINDINGS: I met with the patient and we discussed the procedure of ultrasound-guided biopsy, including benefits and alternatives. We discussed the high likelihood of a successful procedure. We discussed the risks of the procedure, including infection, bleeding, tissue injury, clip migration, and inadequate sampling. Informed written consent was given. The usual time-out protocol was performed immediately prior to the  procedure. Using sterile technique and 1% Lidocaine as local anesthetic, under direct ultrasound visualization, a 14 gauge spring-loaded device was used to perform biopsy of a right axillary lymph node using a lateral approach. At the conclusion of the procedure [facility] tissue marker clip was deployed into the biopsy cavity. Follow up 2 view mammogram was performed and dictated separately. IMPRESSION: Ultrasound guided biopsy of right axilla. No apparent complications. Electronically Signed: By: Curlene Dolphin M.D. On: 08/28/2019 08:37   Mm Clip Placement Right  Result Date: 08/28/2019 CLINICAL DATA:  Ultrasound-guided core needle biopsies were performed of a palpable mass in the 10 o'clock retroareolar right breast and of a right axillary lymph node. EXAM: DIAGNOSTIC RIGHT MAMMOGRAM POST ULTRASOUND BIOPSIES COMPARISON:  Previous  exam(s). FINDINGS: Mammographic images were obtained following ultrasound guided biopsy of palpable right breast mass and a right axillary lymph node with mild thickening. The ribbon shaped biopsy marking clip is in the expected location of the biopsied right breast mass. A HydroMARK biopsy clip is satisfactorily positioned within a right axillary lymph node. IMPRESSION: Appropriate positioning of the ribbon shaped biopsy marking clip at the site of biopsy in the right breast. Appropriate position of HydroMARK biopsy clip in right axilla. Final Assessment: Post Procedure Mammograms for Marker Placement Electronically Signed   By: Curlene Dolphin M.D.   On: 08/28/2019 08:43   [place] Rt Breast Bx W Loc Dev 1st Lesion Img Bx Spec US Guide  Addendum Date: 08/29/2019   ADDENDUM REPORT: 08/29/2019 12:04 ADDENDUM: Pathology revealed GRADE II INVASIVE MAMMARY CARCINOMA of the Right breast. This was found to be concordant by Dr. Curlene Dolphin. Pathology revealed BENIGN LYMPH NODE of the Right axilla. This was found to be concordant by Dr. Curlene Dolphin. Pathology results were discussed with the  patient by telephone. The patient reported doing well after the biopsies with tenderness at the sites. Post biopsy instructions and care were reviewed and questions were answered. The patient was encouraged to call [facility] for any additional concerns. The patient was referred to [facility] on September 05, 2019. Recommendation for a bilateral breast MRI for further evaluation of extent of disease and extremely dense breasts. Pathology results reported by Terie Purser, RN on 08/29/2019. Electronically Signed   By: Curlene Dolphin M.D.   On: 08/29/2019 12:04   Result Date: 08/29/2019 CLINICAL DATA:  Ultrasound-guided core needle biopsy was recommended of a palpable right breast mass in the 10 o'clock position 1 cm from the nipple. EXAM: ULTRASOUND GUIDED RIGHT BREAST CORE NEEDLE BIOPSY COMPARISON:  Previous exam(s). FINDINGS: I met with the patient and we discussed the procedure of ultrasound-guided biopsy, including benefits and alternatives. We discussed the high likelihood of a successful procedure. We discussed the risks of the procedure, including infection, bleeding, tissue injury, clip migration, and inadequate sampling. Informed written consent was given. The usual time-out protocol was performed immediately prior to the procedure. Lesion quadrant: Upper outer quadrant Using sterile technique and 1% Lidocaine as local anesthetic, under direct ultrasound visualization, a 12 gauge spring-loaded device was used to perform biopsy of a palpable 3.3 cm hypoechoic mass with internal vascularity using a lateral approach. At the conclusion of the procedure ribbon tissue marker clip was deployed into the biopsy cavity. Follow up 2 view mammogram was performed and dictated separately. IMPRESSION: Ultrasound guided biopsy of the right breast. No apparent complications. Electronically Signed: By: Curlene Dolphin M.D.  On: 08/28/2019 08:36      IMPRESSION/PLAN: Right Breast Cancer   MRI pending to stage breast before surgery. She is hoping for lumpectomy.  It was a pleasure meeting the patient today. We discussed the risks, benefits, and side effects of radiotherapy. I recommend radiotherapy to the right breast (if she undergoes breast conservation) to reduce her risk of locoregional recurrence by 2/3.  We discussed that radiation would take approximately 4-6 weeks to complete and that I would give the patient a few weeks to heal following surgery before starting treatment planning.  If chemotherapy were to be given, this would precede radiotherapy. If mastectomy is needed, it's less likely she will need adjuvant RT but we discussed certain indications that would be considered such  a positive nodes.  We spoke about acute effects including skin irritation and fatigue as well as much less common late effects including internal organ injury or irritation. We spoke about the latest technology that is used to minimize the risk of late effects for patients undergoing radiotherapy to the breast or chest wall. No guarantees of treatment were given. The patient is enthusiastic about proceeding with treatment. I look forward to participating in the patient's care.  I will await her referral back to me for postoperative follow-up and eventual CT simulation/treatment planning.  I spent 20 minutes  face to face with the patient and more than 50% of that time was spent in counseling and/or coordination of care.   __________________________________________   Eppie Gibson, MD   This document serves as a record of services personally performed by Eppie Gibson, MD. It was created on her behalf by Wilburn Mylar, a trained medical scribe. The creation of this record is based on the scribe's personal observations and the provider's statements to them. This document has been checked and approved by the attending provider.

## 2019-09-05 NOTE — Progress Notes (Signed)
REFERRING PROVIDER: Truitt Merle, MD [address] [place],  [place] [address]  PRIMARY PROVIDER:  Burnett Sheng, MD  PRIMARY REASON FOR VISIT:  1. Malignant neoplasm of upper-outer quadrant of right breast in female, estrogen receptor positive ([place])   2. Family history of pancreatic cancer      I connected with Paula Davidson on 09/05/2019 at 3:00 pm EDT by Webex video conference and verified that I am speaking with the correct person using two identifiers.   Patient location: clinic Provider location: office  HISTORY OF PRESENT ILLNESS:   Paula Davidson, a 48 y.o. female, was seen for a [place] cancer genetics consultation at the request of Dr. Burr Medico due to a personal history of breast cancer and a family history of pancreatic cancer.  Paula Davidson presents to clinic today to discuss the possibility of a hereditary predisposition to cancer, genetic testing, and to further clarify her future cancer risks, as well as potential cancer risks for family members.   In 2020, at the age of 48, Paula Davidson was diagnosed with invasive mammary carcinoma, ER+/PR+/Her2-, of the right breast.    CANCER HISTORY:  Oncology History Overview Note  Cancer Staging Malignant neoplasm of upper-outer quadrant of right breast in female, estrogen receptor positive ([place]) Staging form: Breast, AJCC 8th Edition - Clinical stage from 08/28/2019: Stage IB (cT2, cN0, cM0, G2, ER+, PR+, HER2-) - Signed by Truitt Merle, MD on 09/04/2019    Malignant neoplasm of upper-outer quadrant of right breast in female, estrogen receptor positive ([place])  08/24/2019 Mammogram   Diagnostic mammogram and [place] 08/24/19 IMPRESSION: Suspicious right breast mass 10 o'clock 1 cm from the nipple measuring 3.3 x 1.9 x 1.9 cm and axillary adenopathy.   08/28/2019 Initial Biopsy   Diagnosis 08/28/19 1. Breast, right, needle core biopsy, 10 o'clock - INVASIVE MAMMARY CARCINOMA, GRADE II. - SEE MICROSCOPIC DESCRIPTION. 2. Lymph node,  needle/core biopsy, right axilla - BENIGN LYMPH NODE. - NO METASTATIC CARCINOMA IDENTIFIED.    08/28/2019 Receptors her2   Results: IMMUNOHISTOCHEMICAL AND MORPHOMETRIC ANALYSIS PERFORMED MANUALLY The tumor cells are NEGATIVE for Her2 (1+). Estrogen Receptor: 100%, POSITIVE, STRONG STAINING INTENSITY Progesterone Receptor: 100%, POSITIVE, STRONG STAINING INTENSITY Proliferation Marker Ki67: 10%   08/28/2019 Cancer Staging   Staging form: Breast, AJCC 8th Edition - Clinical stage from 08/28/2019: Stage IB (cT2, cN0, cM0, G2, ER+, PR+, HER2-) - Signed by Truitt Merle, MD on 09/04/2019   08/31/2019 Initial Diagnosis   Malignant neoplasm of upper-outer quadrant of right breast in female, estrogen receptor positive ([place])      RISK FACTORS:  Menarche was at age 64.  No live births.  OCP use for approximately 15 years.  Ovaries intact: yes.  Hysterectomy: no.  Menopausal status: premenopausal.  HRT use: 0 years. Colonoscopy: no. Mammogram within the last year: yes. Number of breast biopsies: 3.  Past Medical History:  Diagnosis Date  . Anxiety   . Depression   . DUB (dysfunctional uterine bleeding)   . Family history of pancreatic cancer   . Hypertension     Past Surgical History:  Procedure Laterality Date  . BREAST SURGERY     Breast lump excised  . FOOT SURGERY    . LAPAROSCOPIC GASTRIC BANDING      Social History   Socioeconomic History  . Marital status: Single    Spouse name: Not on file  . Number of children: Not on file  . Years of education: Not on file  . Highest education level:  Not on file  Occupational History  . Occupation: Librarian, academic   Social Needs  . Financial resource strain: Not on file  . Food insecurity    Worry: Not on file    Inability: Not on file  . Transportation needs    Medical: Not on file    Non-medical: Not on file  Tobacco Use  . Smoking status: Former Smoker    Years: 5.00    Quit date: 05/29/1997    Years since  quitting: 22.2  . Smokeless tobacco: Never Used  Substance and Sexual Activity  . Alcohol use: Yes    Comment: OCC GLASS OF WINE  . Drug use: No  . Sexual activity: Yes    Birth control/protection: I.U.D.    Comment: MIRENA inserted 03-06-09  Lifestyle  . Physical activity    Days per week: Not on file    Minutes per session: Not on file  . Stress: Not on file  Relationships  . Social Herbalist on phone: Not on file    Gets together: Not on file    Attends religious service: Not on file    Active member of club or organization: Not on file    Attends meetings of clubs or organizations: Not on file    Relationship status: Not on file  Other Topics Concern  . Not on file  Social History Narrative  . Not on file     FAMILY HISTORY:  We obtained a detailed, 4-generation family history.  Significant diagnoses are listed below: Family History  Problem Relation Age of Onset  . Hypertension Mother   . Stroke Mother   . Hypertension Father   . Cerebral palsy Brother   . Pancreatic cancer Maternal Grandmother 71  . Cirrhosis Maternal Grandfather   . Heart attack Paternal Grandfather    Paula Davidson does not have children. She has one brother who is 27 and who also does not have children. Her brother has not had cancer.  Paula Davidson's mother died at the age of 76 and did not have cancer. She has two maternal aunts, Katharine Look and [place], who are 15 and 63. Her maternal grandmother died at age 65 from pancreatic cancer, and her maternal grandfather died at an unknown age (but older than 63), due to cirrhosis of the liver from alcoholism. There are no other known diagnoses of cancer on the maternal side of the family.  Paula Davidson's father is currently living at age 44. She has one paternal aunt who is 52. Her paternal grandmother died in childbirth, and her paternal grandfather died at age 103 from a heart attack. There are no known diagnoses of cancer on the paternal side of the  family.  Paula Davidson is unaware of previous family history of genetic testing for hereditary cancer risks. Her maternal ancestors are of [place] American and Native American descent, and paternal ancestors are of African American descent. There is no reported Ashkenazi Jewish ancestry. There is no known consanguinity.  GENETIC COUNSELING ASSESSMENT: Paula Davidson is a 48 y.o. female with a personal and family history of cancer which is somewhat suggestive of a hereditary cancer syndrome and predisposition to cancer. We, therefore, discussed and recommended the following at today's visit.   DISCUSSION: We discussed that 5 - 10% of breast cancer is hereditary, with most cases associated with BRCA1/2.  There are other genes that can be associated with hereditary breast cancer syndromes.  These include ATM, CHEK2, PALB2, etc.  We  discussed that testing is beneficial for several reasons including knowing about other cancer risks, identifying potential screening and risk-reduction options that may be appropriate, and to understand if other family members could be at risk for cancer and allow them to undergo genetic testing.    We reviewed the characteristics, features and inheritance patterns of hereditary cancer syndromes. We also discussed genetic testing, including the appropriate family members to test, the process of testing, insurance coverage and turn-around-time for results. We discussed the implications of a negative, positive and/or variant of uncertain significant result. In order to get genetic test results in a timely manner so that Paula Davidson can use these genetic test results for surgical decisions, we recommended Paula Davidson pursue genetic testing for the [facility] Breast Cancer STAT panel. Once complete, we recommend Paula Davidson pursue reflex genetic testing to the Common Hereditary Cancers panel.   The STAT Breast cancer panel offered by [facility] includes sequencing and rearrangement analysis for the  following 9 genes:  ATM, BRCA1, BRCA2, CDH1, CHEK2, PALB2, PTEN, STK11 and TP53.     The Common Hereditary Cancers Panel offered by [facility] includes sequencing and/or deletion duplication testing of the following 48 genes: APC, ATM, AXIN2, BARD1, BMPR1A, BRCA1, BRCA2, BRIP1, CDH1, CDK4, CDKN2A (p14ARF), CDKN2A (p16INK4a), CHEK2, CTNNA1, DICER1, EPCAM (Deletion/duplication testing only), GREM1 (promoter region deletion/duplication testing only), KIT, MEN1, MLH1, MSH2, MSH3, MSH6, MUTYH, NBN, NF1, NHTL1, PALB2, PDGFRA, PMS2, POLD1, POLE, PTEN, RAD50, RAD51C, RAD51D, RNF43, SDHB, SDHC, SDHD, SMAD4, SMARCA4. STK11, TP53, TSC1, TSC2, and VHL.  The following genes were evaluated for sequence changes only: SDHA and HOXB13 c.251G>A variant only.   Based on Paula Davidson's personal and family history of cancer, she meets medical criteria for genetic testing. Despite that she meets criteria, she may still have an out of pocket cost.   PLAN: After considering the risks, benefits, and limitations, Paula Davidson provided informed consent to pursue genetic testing and the blood sample was sent to [facility] for analysis of the Breast Cancer STAT panel + Common Hereditary Cancers panel. Results should be available within approximately one weeks' time, at which point they will be disclosed by telephone to Paula Davidson, as will any additional recommendations warranted by these results. Paula Davidson will receive a summary of her genetic counseling visit and a copy of her results once available. This information will also be available in Epic.   Paula Davidson questions were answered to her satisfaction today. Our contact information was provided should additional questions or concerns arise. Thank you for the referral and allowing [place] to share in the care of your patient.   Clint Guy, MS, [facility] Certified Genetic Counselor [email or web address] Phone: [phone]  The patient was seen for a total of 25 minutes in  face-to-face genetic counseling.  This patient was discussed with Drs. Magrinat, Lindi Adie and/or Burr Medico who agrees with the above.    _______________________________________________________________________ For Office Staff:  Number of people involved in session: 1 Was an Intern/ student involved with case: no

## 2019-09-06 ENCOUNTER — Telehealth: Payer: Self-pay | Admitting: Hematology

## 2019-09-06 ENCOUNTER — Encounter: Payer: Self-pay | Admitting: *Deleted

## 2019-09-06 NOTE — Telephone Encounter (Signed)
No los per 10/21.

## 2019-09-06 NOTE — Progress Notes (Signed)
   Procurement of Human Biospecimens for the Discovery and  Validation of Biomarkers for the Prediction, Diagnosis and Management of Disease  The study was introduced to the patient by the Dr Burr Medico. I met with the patient after her appointment to explain the study including purpose of the study, risks/benefits, participation requirements, voluntary participation and informed she will receive a $50 VISA gift card once research blood is drawn. Patient verbalizes understanding and agreeable to participate in the study. Patient signed consent/ HIPPA and signed copies were given to patient. Blood will be collected once a lab appointment is scheduled. Patient was thanked for her time and participation in the study. Farris Has [facility]  09/06/19

## 2019-09-07 ENCOUNTER — Other Ambulatory Visit: Payer: Self-pay

## 2019-09-07 ENCOUNTER — Ambulatory Visit
Admission: RE | Admit: 2019-09-07 | Discharge: 2019-09-07 | Disposition: A | Discharge status: Home / Self Care | Payer: BLUE CROSS/BLUE SHIELD | Source: Ambulatory Visit | Attending: General Surgery | Admitting: General Surgery

## 2019-09-07 DIAGNOSIS — Z170 Estrogen receptor positive status [ER+]: Secondary | ICD-10-CM

## 2019-09-07 DIAGNOSIS — C50411 Malignant neoplasm of upper-outer quadrant of right female breast: Secondary | ICD-10-CM

## 2019-09-07 MED ORDER — GADOBUTROL 1 MMOL/ML IV SOLN
10.0000 mL | Freq: Once | INTRAVENOUS | Status: AC | PRN
  Administered 2019-09-07: 10 mL via INTRAVENOUS

## 2019-09-10 ENCOUNTER — Other Ambulatory Visit: Payer: Self-pay | Admitting: General Surgery

## 2019-09-10 DIAGNOSIS — N63 Unspecified lump in unspecified breast: Secondary | ICD-10-CM

## 2019-09-11 ENCOUNTER — Telehealth: Payer: Self-pay | Admitting: Genetic Counselor

## 2019-09-11 ENCOUNTER — Telehealth: Payer: Self-pay | Admitting: *Deleted

## 2019-09-11 NOTE — Telephone Encounter (Signed)
Called patient to give lab appointment date and time scheduled for 09/17/19 at 1:00

## 2019-09-11 NOTE — Telephone Encounter (Signed)
Spoke with Ms. Benedict about her request to cancel genetic testing. She received an email from the genetic testing laboratory, [facility], letting her know that the out of pocket cost would be $1000 if she went through insurance, or $250 if she chose self-pay. She is still interested in genetic testing, but was unsure what the email was indicating.   We reviewed the billing policy for [facility] and discussed that the $1000 cost is an estimate of what her out of pocket cost might be if the test is billed through insurance. Some patients choose to pay the higher out of pocket expense if they know they will meet their deductible that year. The $250 is the self-pay option that [facility] offers, where her insurance would not be billed if she selected this option. This means that this $250 would not go toward any deductibles or out of pocket maximums that Ms. Herro's insurance plan includes. It will be up to Ms. Mortell to determine if she wants the test to be billed through insurance, or if she would rather select the self-pay option.   Ms. Linsley is still interested in completing the genetic testing, but she is unsure which option would be the best choice for her. She has a meeting this Thursday to discuss information about her insurance that will help determine if she is expected to meet her deductible. I have reached out to [facility] to determine when they need Ms. Puccini to make this decision, and if they are able to extend this deadline.

## 2019-09-12 ENCOUNTER — Telehealth: Payer: Self-pay

## 2019-09-12 NOTE — Telephone Encounter (Signed)
Nutrition  Patient attended breast clinic on 09/05/2019.    Called patient to introduce self and service at [facility].  Patient unable to talk to RD at this time but wanted to call RD back at later date.  Contact information provided.    Paula Davidson B. Zenia Resides, [place], [place] Registered Dietitian [phone] (pager)

## 2019-09-12 NOTE — Telephone Encounter (Signed)
I have confirmed with the genetic testing laboratory [facility]) that they will keep her account on hold until she decides if she would like to be billed through insurance or self-pay.

## 2019-09-14 ENCOUNTER — Telehealth: Payer: Self-pay | Admitting: *Deleted

## 2019-09-14 NOTE — Telephone Encounter (Signed)
Left message to follow up from [facility].

## 2019-09-17 ENCOUNTER — Inpatient Hospital Stay: Payer: BLUE CROSS/BLUE SHIELD | Attending: Hematology

## 2019-09-17 ENCOUNTER — Ambulatory Visit
Admission: RE | Admit: 2019-09-17 | Discharge: 2019-09-17 | Disposition: A | Discharge status: Home / Self Care | Payer: BLUE CROSS/BLUE SHIELD | Source: Ambulatory Visit | Attending: General Surgery | Admitting: General Surgery

## 2019-09-17 ENCOUNTER — Encounter (HOSPITAL_BASED_OUTPATIENT_CLINIC_OR_DEPARTMENT_OTHER)
Admission: RE | Admit: 2019-09-17 | Discharge: 2019-09-17 | Disposition: A | Discharge status: Home / Self Care | Payer: BLUE CROSS/BLUE SHIELD | Source: Ambulatory Visit | Attending: General Surgery | Admitting: General Surgery

## 2019-09-17 ENCOUNTER — Other Ambulatory Visit: Payer: Self-pay

## 2019-09-17 ENCOUNTER — Encounter (HOSPITAL_BASED_OUTPATIENT_CLINIC_OR_DEPARTMENT_OTHER): Payer: Self-pay | Admitting: *Deleted

## 2019-09-17 DIAGNOSIS — N63 Unspecified lump in unspecified breast: Secondary | ICD-10-CM

## 2019-09-17 DIAGNOSIS — Z170 Estrogen receptor positive status [ER+]: Secondary | ICD-10-CM

## 2019-09-17 DIAGNOSIS — Z01818 Encounter for other preprocedural examination: Secondary | ICD-10-CM | POA: Diagnosis present

## 2019-09-17 DIAGNOSIS — C50411 Malignant neoplasm of upper-outer quadrant of right female breast: Secondary | ICD-10-CM

## 2019-09-17 LAB — POCT PREGNANCY, URINE: Preg Test, Ur: NEGATIVE

## 2019-09-17 LAB — RESEARCH LABS

## 2019-09-17 NOTE — Progress Notes (Signed)
EKG reviewed by Dr. Marcell Barlow, will proceed with surgery as scheduled.

## 2019-09-17 NOTE — Progress Notes (Signed)

## 2019-09-18 ENCOUNTER — Other Ambulatory Visit: Payer: Self-pay | Admitting: General Surgery

## 2019-09-18 ENCOUNTER — Encounter: Payer: Self-pay | Admitting: *Deleted

## 2019-09-18 DIAGNOSIS — R9389 Abnormal findings on diagnostic imaging of other specified body structures: Secondary | ICD-10-CM

## 2019-09-19 NOTE — Telephone Encounter (Signed)
Paula Davidson has decided that she would like the genetic test billed through insurance. I have updated the genetic testing laboratory with this information.

## 2019-09-20 ENCOUNTER — Other Ambulatory Visit (HOSPITAL_COMMUNITY)
Admission: RE | Admit: 2019-09-20 | Discharge: 2019-09-20 | Disposition: A | Discharge status: Home / Self Care | Payer: BLUE CROSS/BLUE SHIELD | Source: Ambulatory Visit | Attending: General Surgery | Admitting: General Surgery

## 2019-09-20 ENCOUNTER — Ambulatory Visit
Admission: RE | Admit: 2019-09-20 | Discharge: 2019-09-20 | Disposition: A | Discharge status: Home / Self Care | Payer: BLUE CROSS/BLUE SHIELD | Source: Ambulatory Visit | Attending: General Surgery | Admitting: General Surgery

## 2019-09-20 ENCOUNTER — Other Ambulatory Visit: Payer: Self-pay

## 2019-09-20 ENCOUNTER — Other Ambulatory Visit: Payer: Self-pay | Admitting: Diagnostic Radiology

## 2019-09-20 DIAGNOSIS — Z01812 Encounter for preprocedural laboratory examination: Secondary | ICD-10-CM | POA: Insufficient documentation

## 2019-09-20 DIAGNOSIS — Z20828 Contact with and (suspected) exposure to other viral communicable diseases: Secondary | ICD-10-CM | POA: Diagnosis not present

## 2019-09-20 DIAGNOSIS — R9389 Abnormal findings on diagnostic imaging of other specified body structures: Secondary | ICD-10-CM

## 2019-09-20 MED ORDER — GADOBUTROL 1 MMOL/ML IV SOLN
10.0000 mL | Freq: Once | INTRAVENOUS | Status: AC | PRN
  Administered 2019-09-20: 10 mL via INTRAVENOUS

## 2019-09-21 ENCOUNTER — Ambulatory Visit: Payer: Self-pay | Admitting: Genetic Counselor

## 2019-09-21 ENCOUNTER — Telehealth: Payer: Self-pay | Admitting: Genetic Counselor

## 2019-09-21 DIAGNOSIS — Z1379 Encounter for other screening for genetic and chromosomal anomalies: Secondary | ICD-10-CM | POA: Insufficient documentation

## 2019-09-21 NOTE — Telephone Encounter (Signed)
Revealed negative genetic testing.  Discussed that we do not know why she has breast cancer or why there is cancer in the family.  It could be due to a different gene that we are not testing, or our current technology may not be able detect something.  It will be important for her to keep in contact with genetics to keep up with whether additional testing may be appropriate in the future.  Testing did detect a variant of uncertain significance (VUS) in the BRCA1 gene, called c.2048A>G.  Her result is still considered normal and this VUS should not impact her medical management.

## 2019-09-21 NOTE — Telephone Encounter (Signed)
LVM that her genetic test results are available. Requested that she call me back to discuss them. Her results should not impact the type of surgery that she has.

## 2019-09-22 LAB — NOVEL CORONAVIRUS, NAA (HOSP ORDER, SEND-OUT TO REF LAB; TAT 18-24 HRS): SARS-CoV-2, NAA: NOT DETECTED

## 2019-09-23 NOTE — Anesthesia Preprocedure Evaluation (Addendum)
Anesthesia Evaluation  Patient identified by MRN, date of birth, ID band Patient awake    Reviewed: Allergy & Precautions, NPO status , Patient's Chart, lab work & pertinent test results  Airway Mallampati: III  TM Distance: >3 FB Neck ROM: Full    Dental no notable dental hx. (+) Teeth Intact   Pulmonary former smoker,    Pulmonary exam normal breath sounds clear to auscultation       Cardiovascular Exercise Tolerance: Good hypertension, Pt. on home beta blockers and Pt. on medications Normal cardiovascular exam Rhythm:Regular Rate:Normal     Neuro/Psych PSYCHIATRIC DISORDERS Depression    GI/Hepatic negative GI ROS, Neg liver ROS,   Endo/Other  negative endocrine ROS  Renal/GU K+ 3.7 Cr 0.76     Musculoskeletal   Abdominal (+) + obese,   Peds  Hematology Hgb 12.1   Anesthesia Other Findings   Reproductive/Obstetrics negative OB ROS                            Anesthesia Physical Anesthesia Plan  ASA: III  Anesthesia Plan: General and Regional   Post-op Pain Management:    Induction: Intravenous  PONV Risk Score and Plan: 3 and Treatment may vary due to age or medical condition, Dexamethasone, Ondansetron and Midazolam  Airway Management Planned: LMA  Additional Equipment:   Intra-op Plan:   Post-operative Plan:   Informed Consent: I have reviewed the patients History and Physical, chart, labs and discussed the procedure including the risks, benefits and alternatives for the proposed anesthesia with the patient or authorized representative who has indicated his/her understanding and acceptance.     Dental advisory given  Plan Discussed with:   Anesthesia Plan Comments: (Ga W R pec Block)       Anesthesia Quick Evaluation

## 2019-09-24 ENCOUNTER — Encounter (HOSPITAL_BASED_OUTPATIENT_CLINIC_OR_DEPARTMENT_OTHER): Payer: Self-pay | Admitting: Certified Registered"

## 2019-09-24 ENCOUNTER — Ambulatory Visit (HOSPITAL_BASED_OUTPATIENT_CLINIC_OR_DEPARTMENT_OTHER)
Admission: RE | Admit: 2019-09-24 | Discharge: 2019-09-24 | Disposition: A | Discharge status: Home / Self Care | Payer: BLUE CROSS/BLUE SHIELD | Attending: General Surgery | Admitting: General Surgery

## 2019-09-24 ENCOUNTER — Ambulatory Visit (HOSPITAL_BASED_OUTPATIENT_CLINIC_OR_DEPARTMENT_OTHER): Payer: BLUE CROSS/BLUE SHIELD | Admitting: Anesthesiology

## 2019-09-24 ENCOUNTER — Encounter: Payer: Self-pay | Admitting: Genetic Counselor

## 2019-09-24 ENCOUNTER — Encounter (HOSPITAL_BASED_OUTPATIENT_CLINIC_OR_DEPARTMENT_OTHER)
Admission: RE | Disposition: A | Discharge status: Home / Self Care | Payer: Self-pay | Source: Home / Self Care | Attending: General Surgery

## 2019-09-24 ENCOUNTER — Other Ambulatory Visit: Payer: Self-pay

## 2019-09-24 ENCOUNTER — Encounter (HOSPITAL_COMMUNITY)
Admission: RE | Admit: 2019-09-24 | Discharge: 2019-09-24 | Disposition: A | Discharge status: Home / Self Care | Payer: BLUE CROSS/BLUE SHIELD | Source: Ambulatory Visit | Attending: General Surgery | Admitting: General Surgery

## 2019-09-24 DIAGNOSIS — Z170 Estrogen receptor positive status [ER+]: Secondary | ICD-10-CM | POA: Insufficient documentation

## 2019-09-24 DIAGNOSIS — Z87891 Personal history of nicotine dependence: Secondary | ICD-10-CM | POA: Insufficient documentation

## 2019-09-24 DIAGNOSIS — C50411 Malignant neoplasm of upper-outer quadrant of right female breast: Secondary | ICD-10-CM

## 2019-09-24 DIAGNOSIS — C801 Malignant (primary) neoplasm, unspecified: Secondary | ICD-10-CM

## 2019-09-24 HISTORY — DX: Malignant (primary) neoplasm, unspecified: C80.1

## 2019-09-24 HISTORY — PX: BREAST SURGERY: SHX581

## 2019-09-24 HISTORY — PX: BREAST LUMPECTOMY WITH AXILLARY LYMPH NODE BIOPSY: SHX5593

## 2019-09-24 SURGERY — BREAST LUMPECTOMY WITH AXILLARY LYMPH NODE BIOPSY
Anesthesia: Regional | Site: Breast | Laterality: Right

## 2019-09-24 MED ORDER — FENTANYL CITRATE (PF) 100 MCG/2ML IJ SOLN
INTRAMUSCULAR | Status: AC
  Filled 2019-09-24: qty 2

## 2019-09-24 MED ORDER — FENTANYL CITRATE (PF) 100 MCG/2ML IJ SOLN
50.0000 ug | INTRAMUSCULAR | Status: DC | PRN
  Administered 2019-09-24 (×2): 100 ug via INTRAVENOUS

## 2019-09-24 MED ORDER — LACTATED RINGERS IV SOLN
INTRAVENOUS | Status: DC
  Administered 2019-09-24: 08:00:00 via INTRAVENOUS

## 2019-09-24 MED ORDER — OXYCODONE HCL 5 MG/5ML PO SOLN: 5.0000 mg | Freq: Once | ORAL | Status: DC | PRN

## 2019-09-24 MED ORDER — CHLORHEXIDINE GLUCONATE CLOTH 2 % EX PADS: 6.0000 | MEDICATED_PAD | Freq: Once | CUTANEOUS | Status: DC

## 2019-09-24 MED ORDER — TRAMADOL HCL 50 MG PO TABS: 50.0000 mg | ORAL_TABLET | Freq: Four times a day (QID) | ORAL | 0 refills | Status: DC | PRN

## 2019-09-24 MED ORDER — HYDROCODONE-ACETAMINOPHEN 5-325 MG PO TABS: 1.0000 | ORAL_TABLET | Freq: Four times a day (QID) | ORAL | 0 refills | Status: DC | PRN

## 2019-09-24 MED ORDER — OXYCODONE HCL 5 MG PO TABS: 5.0000 mg | ORAL_TABLET | Freq: Once | ORAL | Status: DC | PRN

## 2019-09-24 MED ORDER — DEXAMETHASONE SODIUM PHOSPHATE 4 MG/ML IJ SOLN
INTRAMUSCULAR | Status: DC | PRN
  Administered 2019-09-24: 5 mg via INTRAVENOUS

## 2019-09-24 MED ORDER — ONDANSETRON 4 MG PO TBDP
ORAL_TABLET | ORAL | Status: AC
  Filled 2019-09-24: qty 1

## 2019-09-24 MED ORDER — MEPERIDINE HCL 25 MG/ML IJ SOLN: 6.2500 mg | INTRAMUSCULAR | Status: DC | PRN

## 2019-09-24 MED ORDER — PHENYLEPHRINE 40 MCG/ML (10ML) SYRINGE FOR IV PUSH (FOR BLOOD PRESSURE SUPPORT)
PREFILLED_SYRINGE | INTRAVENOUS | Status: DC | PRN
  Administered 2019-09-24 (×2): 80 ug via INTRAVENOUS

## 2019-09-24 MED ORDER — ACETAMINOPHEN 500 MG PO TABS
ORAL_TABLET | ORAL | Status: AC
  Filled 2019-09-24: qty 2

## 2019-09-24 MED ORDER — CELECOXIB 200 MG PO CAPS
200.0000 mg | ORAL_CAPSULE | ORAL | Status: AC
  Administered 2019-09-24: 200 mg via ORAL

## 2019-09-24 MED ORDER — PROPOFOL 500 MG/50ML IV EMUL
INTRAVENOUS | Status: DC | PRN
  Administered 2019-09-24: 25 ug/kg/min via INTRAVENOUS

## 2019-09-24 MED ORDER — ONDANSETRON HCL 4 MG/2ML IJ SOLN: 4.0000 mg | Freq: Once | INTRAMUSCULAR | Status: DC | PRN

## 2019-09-24 MED ORDER — CEFAZOLIN SODIUM-DEXTROSE 2-4 GM/100ML-% IV SOLN
INTRAVENOUS | Status: AC
  Filled 2019-09-24: qty 100

## 2019-09-24 MED ORDER — ROPIVACAINE HCL 5 MG/ML IJ SOLN
INTRAMUSCULAR | Status: DC | PRN
  Administered 2019-09-24: 30 mL

## 2019-09-24 MED ORDER — CEFAZOLIN SODIUM-DEXTROSE 2-4 GM/100ML-% IV SOLN
2.0000 g | INTRAVENOUS | Status: AC
  Administered 2019-09-24: 2 g via INTRAVENOUS

## 2019-09-24 MED ORDER — GABAPENTIN 300 MG PO CAPS
300.0000 mg | ORAL_CAPSULE | ORAL | Status: AC
  Administered 2019-09-24: 300 mg via ORAL

## 2019-09-24 MED ORDER — TECHNETIUM TC 99M SULFUR COLLOID FILTERED
1.0000 | Freq: Once | INTRAVENOUS | Status: AC | PRN
  Administered 2019-09-24: 1 via INTRADERMAL

## 2019-09-24 MED ORDER — ONDANSETRON 4 MG PO TBDP
4.0000 mg | ORAL_TABLET | Freq: Once | ORAL | Status: AC
  Administered 2019-09-24: 4 mg via ORAL

## 2019-09-24 MED ORDER — LIDOCAINE HCL (CARDIAC) PF 100 MG/5ML IV SOSY
PREFILLED_SYRINGE | INTRAVENOUS | Status: DC | PRN
  Administered 2019-09-24: 30 mg via INTRAVENOUS

## 2019-09-24 MED ORDER — MIDAZOLAM HCL 2 MG/2ML IJ SOLN
INTRAMUSCULAR | Status: AC
  Filled 2019-09-24: qty 2

## 2019-09-24 MED ORDER — ACETAMINOPHEN 10 MG/ML IV SOLN: 1000.0000 mg | Freq: Once | INTRAVENOUS | Status: DC | PRN

## 2019-09-24 MED ORDER — MIDAZOLAM HCL 2 MG/2ML IJ SOLN
1.0000 mg | INTRAMUSCULAR | Status: DC | PRN
  Administered 2019-09-24: 09:00:00 2 mg via INTRAVENOUS

## 2019-09-24 MED ORDER — HYDROMORPHONE HCL 1 MG/ML IJ SOLN: 0.2500 mg | INTRAMUSCULAR | Status: DC | PRN

## 2019-09-24 MED ORDER — CELECOXIB 200 MG PO CAPS
ORAL_CAPSULE | ORAL | Status: AC
  Filled 2019-09-24: qty 1

## 2019-09-24 MED ORDER — GABAPENTIN 300 MG PO CAPS
ORAL_CAPSULE | ORAL | Status: AC
  Filled 2019-09-24: qty 1

## 2019-09-24 MED ORDER — ACETAMINOPHEN 500 MG PO TABS
1000.0000 mg | ORAL_TABLET | ORAL | Status: AC
  Administered 2019-09-24: 08:00:00 1000 mg via ORAL

## 2019-09-24 MED ORDER — ONDANSETRON HCL 4 MG/2ML IJ SOLN
INTRAMUSCULAR | Status: DC | PRN
  Administered 2019-09-24: 4 mg via INTRAVENOUS

## 2019-09-24 MED ORDER — BUPIVACAINE-EPINEPHRINE 0.25% -1:200000 IJ SOLN
INTRAMUSCULAR | Status: DC | PRN
  Administered 2019-09-24: 15 mL

## 2019-09-24 MED ORDER — CLONIDINE HCL (ANALGESIA) 100 MCG/ML EP SOLN
EPIDURAL | Status: DC | PRN
  Administered 2019-09-24: 100 ug

## 2019-09-24 MED ORDER — PROPOFOL 10 MG/ML IV BOLUS
INTRAVENOUS | Status: DC | PRN
  Administered 2019-09-24: 200 mg via INTRAVENOUS

## 2019-09-24 SURGICAL SUPPLY — 47 items
APPLIER CLIP 11 MED OPEN (CLIP) ×3
BINDER BREAST 3XL (GAUZE/BANDAGES/DRESSINGS) ×3 IMPLANT
BLADE SURG 15 STRL LF DISP TIS (BLADE) ×1 IMPLANT
BLADE SURG 15 STRL SS (BLADE) ×2
CANISTER SUCT 1200ML W/VALVE (MISCELLANEOUS) ×3 IMPLANT
CHLORAPREP W/TINT 26 (MISCELLANEOUS) ×3 IMPLANT
CLIP APPLIE 11 MED OPEN (CLIP) ×1 IMPLANT
COVER BACK TABLE REUSABLE LG (DRAPES) ×3 IMPLANT
COVER MAYO STAND REUSABLE (DRAPES) ×3 IMPLANT
COVER PROBE W GEL 5X96 (DRAPES) ×3 IMPLANT
COVER WAND RF STERILE (DRAPES) IMPLANT
DECANTER SPIKE VIAL GLASS SM (MISCELLANEOUS) IMPLANT
DERMABOND ADVANCED (GAUZE/BANDAGES/DRESSINGS) ×4
DERMABOND ADVANCED .7 DNX12 (GAUZE/BANDAGES/DRESSINGS) ×2 IMPLANT
DRAPE LAPAROSCOPIC ABDOMINAL (DRAPES) ×3 IMPLANT
DRAPE UTILITY XL STRL (DRAPES) ×3 IMPLANT
ELECT COATED BLADE 2.86 ST (ELECTRODE) ×3 IMPLANT
ELECT REM PT RETURN 9FT ADLT (ELECTROSURGICAL) ×3
ELECTRODE REM PT RTRN 9FT ADLT (ELECTROSURGICAL) ×1 IMPLANT
GLOVE BIO SURGEON STRL SZ 6.5 (GLOVE) ×2 IMPLANT
GLOVE BIO SURGEON STRL SZ7.5 (GLOVE) ×3 IMPLANT
GLOVE BIO SURGEONS STRL SZ 6.5 (GLOVE) ×1
GLOVE BIOGEL PI IND STRL 6.5 (GLOVE) ×1 IMPLANT
GLOVE BIOGEL PI INDICATOR 6.5 (GLOVE) ×2
GOWN STRL REUS W/ TWL LRG LVL3 (GOWN DISPOSABLE) ×2 IMPLANT
GOWN STRL REUS W/TWL LRG LVL3 (GOWN DISPOSABLE) ×4
ILLUMINATOR WAVEGUIDE N/F (MISCELLANEOUS) IMPLANT
KIT MARKER MARGIN INK (KITS) IMPLANT
LIGHT WAVEGUIDE WIDE FLAT (MISCELLANEOUS) IMPLANT
NDL SAFETY ECLIPSE 18X1.5 (NEEDLE) IMPLANT
NEEDLE HYPO 18GX1.5 SHARP (NEEDLE)
NEEDLE HYPO 25X1 1.5 SAFETY (NEEDLE) ×3 IMPLANT
NS IRRIG 1000ML POUR BTL (IV SOLUTION) ×3 IMPLANT
PACK BASIN DAY SURGERY FS (CUSTOM PROCEDURE TRAY) ×3 IMPLANT
PENCIL BUTTON HOLSTER BLD 10FT (ELECTRODE) ×3 IMPLANT
SLEEVE SCD COMPRESS KNEE MED (MISCELLANEOUS) ×3 IMPLANT
SPONGE LAP 18X18 RF (DISPOSABLE) ×6 IMPLANT
SUT ETHILON 3 0 FSL (SUTURE) IMPLANT
SUT MON AB 4-0 PC3 18 (SUTURE) ×6 IMPLANT
SUT SILK 3 0 PS 1 (SUTURE) IMPLANT
SUT VICRYL 3-0 CR8 SH (SUTURE) ×3 IMPLANT
SYR CONTROL 10ML LL (SYRINGE) ×3 IMPLANT
TOWEL GREEN STERILE FF (TOWEL DISPOSABLE) ×3 IMPLANT
TRAY FAXITRON CT DISP (TRAY / TRAY PROCEDURE) ×3 IMPLANT
TUBE CONNECTING 20'X1/4 (TUBING) ×1
TUBE CONNECTING 20X1/4 (TUBING) ×2 IMPLANT
YANKAUER SUCT BULB TIP NO VENT (SUCTIONS) ×3 IMPLANT

## 2019-09-24 NOTE — Progress Notes (Signed)
At bedside when Nuclear Medicine arrived.  Shanin performed procedure and patient tolerated it well. Patient is asleep

## 2019-09-24 NOTE — Anesthesia Procedure Notes (Addendum)
Anesthesia Regional Block: Pectoralis block   Pre-Anesthetic Checklist: ,, timeout performed, Correct Patient, Correct Site, Correct Laterality, Correct Procedure, Correct Position, site marked, Risks and benefits discussed,  Surgical consent,  Pre-op evaluation,  At surgeon's request and post-op pain management  Laterality: Right  Prep: chloraprep       Needles:  Injection technique: Single-shot  Needle Type: Echogenic Needle     Needle Length: 9cm  Needle Gauge: 21     Additional Needles:   Procedures:,,,, ultrasound used (permanent image in chart),,,,  Narrative:  Start time: 09/24/2019 8:53 AM End time: 09/24/2019 9:02 AM Injection made incrementally with aspirations every 5 mL.  Performed by: Personally  Anesthesiologist: Barnet Glasgow, MD  Additional Notes: Block assessed. Patient tolerated procedure well.

## 2019-09-24 NOTE — H&P (Signed)
Paula Davidson  Location: [facility] Surgery Paula Davidson #: [id number] DOB: [birth year] Undefined / Language: Cleophus Molt / Race: Black or African American Female   History of Present Illness  The Paula Davidson is a 48 year old female who presents with breast cancer. We are asked to see the Paula Davidson in consultation by Dr. Burr Medico to evaluate her for a new right breast cancer. The Paula Davidson is a 48 year old black female who presents with a palpable mass in the UOQ of the central right breast. She feels as though this has been palpable for a long time. She denies any pain but has noticed some burning. She denies d/c from nipple. She had this biopsied years ago and it was benign. She does not smoke. The mass measured 3.3cm by u/s and was not seen well on mammogram. There were 2 abnml lymph nodes but these were neg on biopsy. The cancer was an IDC grade II that was ER and PR+ and Her2 - with a Ki67 of 10%.   Past Surgical History  Breast Biopsy  Right. Foot Surgery  Left. Lap Band  Sentinel Lymph Node Biopsy   Diagnostic Studies History  Colonoscopy  never Mammogram  within last year Pap Smear  1-5 years ago  Medication History  Medications Reconciled  Social History Alcohol use  Occasional alcohol use. Caffeine use  Carbonated beverages, Coffee. No drug use  Tobacco use  Former smoker.  Family History Cerebrovascular Accident  Mother. Hypertension  Father, Mother. Malignant Neoplasm Of Pancreas  Family Members In General.  Pregnancy / Birth History Age at menarche  24 years. Contraceptive History  Intrauterine device, Oral contraceptives. Gravida  0 Irregular periods   Other Problems  Anxiety Disorder  Depression  High blood pressure  Lump In Breast     Review of Systems  General Not Present- Appetite Loss, Chills, Fatigue, Fever, Night Sweats, Weight Gain and Weight Loss. Skin Present- Dryness. Not Present- Change in Wart/Mole, Hives, Jaundice, New Lesions,  Non-Healing Wounds, Rash and Ulcer. HEENT Present- Seasonal Allergies and Wears glasses/contact lenses. Not Present- Earache, Hearing Loss, Hoarseness, Nose Bleed, Oral Ulcers, Ringing in the Ears, Sinus Pain, Sore Throat, Visual Disturbances and Yellow Eyes. Respiratory Present- Snoring. Not Present- Bloody sputum, Chronic Cough, Difficulty Breathing and Wheezing. Breast Present- Breast Mass. Not Present- Breast Pain, Nipple Discharge and Skin Changes. Cardiovascular Not Present- Chest Pain, Difficulty Breathing Lying Down, Leg Cramps, Palpitations, Rapid Heart Rate, Shortness of Breath and Swelling of Extremities. Gastrointestinal Not Present- Abdominal Pain, Bloating, Bloody Stool, Change in Bowel Habits, Chronic diarrhea, Constipation, Difficulty Swallowing, Excessive gas, Gets full quickly at meals, Hemorrhoids, Indigestion, Nausea, Rectal Pain and Vomiting. Female Genitourinary Not Present- Frequency, Nocturia, Painful Urination, Pelvic Pain and Urgency. Musculoskeletal Not Present- Back Pain, Joint Pain, Joint Stiffness, Muscle Pain, Muscle Weakness and Swelling of Extremities. Neurological Not Present- Decreased Memory, Fainting, Headaches, Numbness, Seizures, Tingling, Tremor, Trouble walking and Weakness. Psychiatric Present- Anxiety and Fearful. Not Present- Bipolar, Change in Sleep Pattern, Depression and Frequent crying. Endocrine Not Present- Cold Intolerance, Excessive Hunger, Hair Changes, Heat Intolerance, Hot flashes and New Diabetes. Hematology Not Present- Blood Thinners, Easy Bruising, Excessive bleeding, Gland problems, HIV and Persistent Infections.   Physical Exam  General Mental Status-Alert. General Appearance-Consistent with stated age. Hydration-Well hydrated. Voice-Normal.  Head and Neck Head-normocephalic, atraumatic with no lesions or palpable masses. Trachea-midline. Thyroid Gland Characteristics - normal size and consistency.  Eye Eyeball -  Bilateral-Extraocular movements intact. Sclera/Conjunctiva - Bilateral-No scleral icterus.  Chest and Lung Exam  Chest and lung exam reveals -quiet, even and easy respiratory effort with no use of accessory muscles and on auscultation, normal breath sounds, no adventitious sounds and normal vocal resonance. Inspection Chest Wall - Normal. Back - normal.  Breast Note: There is a 3cm mass in the upper outer subareolar right breast that seems a little tethered to the skin. There is no palpable mass in the left breast. There is no palpable axillary, supraclavicular, or cervical lymphadenopathy   Cardiovascular Cardiovascular examination reveals -normal heart sounds, regular rate and rhythm with no murmurs and normal pedal pulses bilaterally.  Abdomen Inspection Inspection of the abdomen reveals - No Hernias. Skin - Scar - no surgical scars. Palpation/Percussion Palpation and Percussion of the abdomen reveal - Soft, Non Tender, No Rebound tenderness, No Rigidity (guarding) and No hepatosplenomegaly. Auscultation Auscultation of the abdomen reveals - Bowel sounds normal.  Neurologic Neurologic evaluation reveals -alert and oriented x 3 with no impairment of recent or remote memory. Mental Status-Normal.  Musculoskeletal Normal Exam - Left-Upper Extremity Strength Normal and Lower Extremity Strength Normal. Normal Exam - Right-Upper Extremity Strength Normal and Lower Extremity Strength Normal.  Lymphatic Head & Neck  General Head & Neck Lymphatics: Bilateral - Description - Normal. Axillary  General Axillary Region: Bilateral - Description - Normal. Tenderness - Non Tender. Femoral & Inguinal  Generalized Femoral & Inguinal Lymphatics: Bilateral - Description - Normal. Tenderness - Non Tender.    Assessment & Plan MALIGNANT NEOPLASM OF UPPER-OUTER QUADRANT OF RIGHT BREAST IN FEMALE, ESTROGEN RECEPTOR POSITIVE (C50.411) Impression: The Paula Davidson appears to have a  3.3cm cancer in the upper outer subareolar right breast with clinically neg nodes. I have discussed with her in detail the different options for treatment and at this point she favors breast conservation and sentinel node biopsy. I have discussed with her the risks and benefits of the surgery as well as some of the technical aspects and she understands and wishes to proceed. We will obtain an MRI given the density of her breast. Current Plans Referred to Oncology, for evaluation and follow up (Oncology). Routine.

## 2019-09-24 NOTE — Interval H&P Note (Signed)
History and Physical Interval Note:  09/24/2019 9:06 AM  Paula Davidson  has presented today for surgery, with the diagnosis of RIGHT BREAST CANCER.  The various methods of treatment have been discussed with the patient and family. After consideration of risks, benefits and other options for treatment, the patient has consented to  Procedure(s): RIGHT BREAST LUMPECTOMY WITH SENTINEL LYMPH NODE BIOPSY (Right) as a surgical intervention.  The patient's history has been reviewed, patient examined, no change in status, stable for surgery.  I have reviewed the patient's chart and labs.  Questions were answered to the patient's satisfaction.     Autumn Messing III

## 2019-09-24 NOTE — Op Note (Signed)
09/24/2019  10:55 AM  PATIENT:  Paula Davidson  48 y.o. female  PRE-OPERATIVE DIAGNOSIS:  RIGHT BREAST CANCER  POST-OPERATIVE DIAGNOSIS:  RIGHT BREAST CANCER  PROCEDURE:  Procedure(s): RIGHT BREAST LUMPECTOMY WITH SENTINEL LYMPH NODE BIOPSY (Right)  SURGEON:  Surgeon(s) and Role:    * Jovita Kussmaul, MD - Primary  PHYSICIAN ASSISTANT:   ASSISTANTS: none   ANESTHESIA:   local and general  EBL:  minimal   BLOOD ADMINISTERED:none  DRAINS: none   LOCAL MEDICATIONS USED:  MARCAINE     SPECIMEN:  Source of Specimen:  right breast tissue with additional inferior margin and sentinel nodes x 3  DISPOSITION OF SPECIMEN:  PATHOLOGY  COUNTS:  YES  TOURNIQUET:  * No tourniquets in log *  DICTATION: .Dragon Dictation   After informed consent was obtained the patient was brought to the operating room and placed in the supine position on the operating table.  After adequate induction of general anesthesia the patient's right chest, breast, and axillary area were prepped with ChloraPrep, allowed to dry, and draped in usual sterile manner.  An appropriate timeout was performed.  Earlier in the day the patient underwent injection of 1 mCi of technetium sulfur colloid in the subareolar position on the right.  The neoprobe was set to technetium in some radioactivity was identified in the right axilla.  This area was infiltrated with quarter percent Marcaine.  A transversely oriented incision was made with a 15 blade knife overlying the area of radioactivity.  The incision was carried through the skin and subcutaneous tissue sharply with the electrocautery until the deep right axillary space was entered.  The neoprobe was used to direct blunt hemostat dissection.  I was able to identify 3 lymph nodes with some mild radioactivity.  These 3 nodes were excised sharply with the electrocautery and the lymphatics and small vessels around them were controlled with clips.  Ex vivo counts on all 3 nodes were  approximately 20 counts.  No other hot or palpable lymph nodes were identified in the right axilla.  The deep layer of the wound was then closed with interrupted 3-0 Vicryl stitches.  The skin was closed with a running 4-0 Monocryl subcuticular stitch.  Attention was then turned to the right breast.  The cancer was palpable just under the lateral edge of the right areola.  In order to have the best chance at getting a clean margin I decided to make a radially oriented elliptical incision overlying the palpable mass.  The incision was carried through the skin and subcutaneous tissue sharply with the electrocautery.  I then removed a large circle of breast tissue around the cancer while palpating the cancer to make sure that we were outside of what we could feel.  Once the specimen was removed it was oriented with the appropriate paint colors.  A specimen radiograph was obtained that showed the clip to be within the specimen.  It did appear to be close to the inferior margin so an additional inferior margin was taken and marked appropriately.  All of this tissue was sent to pathology for further evaluation.  Hemostasis was achieved using the Bovie electrocautery.  The cavity was marked with clips.  The cavity was then irrigated with saline and infiltrated with quarter percent Marcaine.  The deep layer of the wound was then closed with layers of interrupted 3-0 Vicryl stitches.  The skin was then closed with a running 4-0 Monocryl subcuticular stitch.  Dermabond dressings were applied.  The patient tolerated the procedure well.  At the end of the case all needle sponge and instrument counts were correct.  The patient was then awakened and taken to recovery in stable condition.  PLAN OF CARE: Discharge to home after PACU  PATIENT DISPOSITION:  PACU - hemodynamically stable.   Delay start of Pharmacological VTE agent (>24hrs) due to surgical blood loss or risk of bleeding: not applicable

## 2019-09-24 NOTE — Progress Notes (Signed)
Assisted Dr. Valma Cava with right, ultrasound guided, pectoralis block. Side rails up, monitors on throughout procedure. See vital signs in flow sheet. Tolerated Procedure well.

## 2019-09-24 NOTE — Anesthesia Procedure Notes (Signed)
Procedure Name: LMA Insertion Date/Time: 09/24/2019 9:25 AM Performed by: Signe Colt, CRNA Pre-anesthesia Checklist: Patient identified, Emergency Drugs available, Suction available and Patient being monitored Patient Re-evaluated:Patient Re-evaluated prior to induction Oxygen Delivery Method: Circle system utilized Preoxygenation: Pre-oxygenation with 100% oxygen Induction Type: IV induction Ventilation: Mask ventilation without difficulty LMA: LMA inserted LMA Size: 4.0 Number of attempts: 1 Airway Equipment and Method: Bite block Placement Confirmation: positive ETCO2 Tube secured with: Tape Dental Injury: Teeth and Oropharynx as per pre-operative assessment

## 2019-09-24 NOTE — Discharge Instructions (Signed)
No Tylenol before 2:30pm. No ibuprofen before 4:30pm   Post Anesthesia Home Care Instructions  Activity: Get plenty of rest for the remainder of the day. A responsible individual must stay with you for 24 hours following the procedure.  For the next 24 hours, DO NOT: -Drive a car -Paediatric nurse -Drink alcoholic beverages -Take any medication unless instructed by your physician -Make any legal decisions or sign important papers.  Meals: Start with liquid foods such as gelatin or soup. Progress to regular foods as tolerated. Avoid greasy, spicy, heavy foods. If nausea and/or vomiting occur, drink only clear liquids until the nausea and/or vomiting subsides. Call your physician if vomiting continues.  Special Instructions/Symptoms: Your throat may feel dry or sore from the anesthesia or the breathing tube placed in your throat during surgery. If this causes discomfort, gargle with warm salt water. The discomfort should disappear within 24 hours.  If you had a scopolamine patch placed behind your ear for the management of post- operative nausea and/or vomiting:  1. The medication in the patch is effective for 72 hours, after which it should be removed.  Wrap patch in a tissue and discard in the trash. Wash hands thoroughly with soap and water. 2. You may remove the patch earlier than 72 hours if you experience unpleasant side effects which may include dry mouth, dizziness or visual disturbances. 3. Avoid touching the patch. Wash your hands with soap and water after contact with the patch.

## 2019-09-24 NOTE — Anesthesia Postprocedure Evaluation (Signed)
Anesthesia Post Note  Patient: Annabel Vogan  Procedure(s) Performed: RIGHT BREAST LUMPECTOMY WITH SENTINEL LYMPH NODE BIOPSY (Right Breast)     Patient location during evaluation: PACU Anesthesia Type: Regional and General Level of consciousness: awake and alert Pain management: pain level controlled Vital Signs Assessment: post-procedure vital signs reviewed and stable Respiratory status: spontaneous breathing, nonlabored ventilation, respiratory function stable and patient connected to nasal cannula oxygen Cardiovascular status: blood pressure returned to baseline and stable Postop Assessment: no apparent nausea or vomiting Anesthetic complications: no    Last Vitals:  Vitals:   09/24/19 1200 09/24/19 1225  BP: (!) 142/84 (!) 151/86  Pulse: 73 68  Resp: 15 16  Temp:  36.7 C  SpO2: 97% 100%    Last Pain:  Vitals:   09/24/19 1225  TempSrc: Oral  PainSc: 0-No pain                 Barnet Glasgow

## 2019-09-24 NOTE — Progress Notes (Signed)
HPI:  Paula Davidson was previously seen in the [place] [facility] due to a personal history of breast cancer as well as a family history of pancreatic cancer, and concerns regarding a hereditary predisposition to cancer. Please refer to our prior cancer genetics clinic note for more information regarding our discussion, assessment and recommendations, at the time. Paula Davidson recent genetic test results were disclosed to her, as were recommendations warranted by these results. These results and recommendations are discussed in more detail below.  CANCER HISTORY:  Oncology History Overview Note  Cancer Staging Malignant neoplasm of upper-outer quadrant of right breast in female, estrogen receptor positive ([place]) Staging form: Breast, AJCC 8th Edition - Clinical stage from 08/28/2019: Stage IB (cT2, cN0, cM0, G2, ER+, PR+, HER2-) - Signed by Truitt Merle, MD on 09/04/2019    Malignant neoplasm of upper-outer quadrant of right breast in female, estrogen receptor positive ([place])  08/24/2019 Mammogram   Diagnostic mammogram and [place] 08/24/19 IMPRESSION: Suspicious right breast mass 10 o'clock 1 cm from the nipple measuring 3.3 x 1.9 x 1.9 cm and axillary adenopathy.   08/28/2019 Initial Biopsy   Diagnosis 08/28/19 1. Breast, right, needle core biopsy, 10 o'clock - INVASIVE MAMMARY CARCINOMA, GRADE II. - SEE MICROSCOPIC DESCRIPTION. 2. Lymph node, needle/core biopsy, right axilla - BENIGN LYMPH NODE. - NO METASTATIC CARCINOMA IDENTIFIED.    08/28/2019 Receptors her2   Results: IMMUNOHISTOCHEMICAL AND MORPHOMETRIC ANALYSIS PERFORMED MANUALLY The tumor cells are NEGATIVE for Her2 (1+). Estrogen Receptor: 100%, POSITIVE, STRONG STAINING INTENSITY Progesterone Receptor: 100%, POSITIVE, STRONG STAINING INTENSITY Proliferation Marker Ki67: 10%   08/28/2019 Cancer Staging   Staging form: Breast, AJCC 8th Edition - Clinical stage from 08/28/2019: Stage IB (cT2, cN0, cM0, G2, ER+, PR+,  HER2-) - Signed by Truitt Merle, MD on 09/04/2019   08/31/2019 Initial Diagnosis   Malignant neoplasm of upper-outer quadrant of right breast in female, estrogen receptor positive ([place])   09/21/2019 Genetic Testing   Negative genetic testing:  No pathogenic variants detected on the [facility] Breast Cancer STAT panel and Common Hereditary Cancers panel.  A variant of uncertain significance was identified in the BRCA1 gene, called c.2048A>G ([id number]).  The report date is 09/21/2019.  The STAT Breast cancer panel offered by [facility] includes sequencing and rearrangement analysis for the following 9 genes:  ATM, BRCA1, BRCA2, CDH1, CHEK2, PALB2, PTEN, STK11 and TP53.   The Common Hereditary Cancers Panel offered by [facility] includes sequencing and/or deletion duplication testing of the following 48 genes: APC, ATM, AXIN2, BARD1, BMPR1A, BRCA1, BRCA2, BRIP1, CDH1, CDK4, CDKN2A (p14ARF), CDKN2A (p16INK4a), CHEK2, CTNNA1, DICER1, EPCAM (Deletion/duplication testing only), GREM1 (promoter region deletion/duplication testing only), KIT, MEN1, MLH1, MSH2, MSH3, MSH6, MUTYH, NBN, NF1, NHTL1, PALB2, PDGFRA, PMS2, POLD1, POLE, PTEN, RAD50, RAD51C, RAD51D, RNF43, SDHB, SDHC, SDHD, SMAD4, SMARCA4. STK11, TP53, TSC1, TSC2, and VHL.  The following genes were evaluated for sequence changes only: SDHA and HOXB13 c.251G>A variant only.      FAMILY HISTORY:  We obtained a detailed, 4-generation family history.  Significant diagnoses are listed below: Family History  Problem Relation Age of Onset  . Hypertension Mother   . Stroke Mother   . Hypertension Father   . Cerebral palsy Brother   . Pancreatic cancer Maternal Grandmother 45  . Cirrhosis Maternal Grandfather   . Heart attack Paternal Grandfather     [name] does not have children. She has one brother who is 63 and who also does not have children. Her brother has not  had cancer.  Paula Davidson's mother died at the age of 10 and did not have cancer. She has two  maternal aunts, Katharine Look and [place], who are 52 and 63. Her maternal grandmother died at age 81 from pancreatic cancer, and her maternal grandfather died at an unknown age (but older than 9), due to cirrhosis of the liver from alcoholism. There are no other known diagnoses of cancer on the maternal side of the family.  Paula Davidson's father is currently living at age 26. She has one paternal aunt who is 61. Her paternal grandmother died in childbirth, and her paternal grandfather died at age 59 from a heart attack. There are no known diagnoses of cancer on the paternal side of the family.  Paula Davidson is unaware of previous family history of genetic testing for hereditary cancer risks. Her maternal ancestors are of [place] American and Native American descent, and paternal ancestors are of African American descent. There is no reported Ashkenazi Jewish ancestry. There is no known consanguinity.  GENETIC TEST RESULTS: Genetic testing reported out on 09/21/2019 through the [facility] Breast Cancer STAT panel + Common Hereditary Cancers panel found no pathogenic variants.   The STAT Breast cancer panel offered by [facility] includes sequencing and rearrangement analysis for the following 9 genes:  ATM, BRCA1, BRCA2, CDH1, CHEK2, PALB2, PTEN, STK11 and TP53.  The Common Hereditary Cancers Panel offered by [facility] includes sequencing and/or deletion duplication testing of the following 48 genes: APC, ATM, AXIN2, BARD1, BMPR1A, BRCA1, BRCA2, BRIP1, CDH1, CDK4, CDKN2A (p14ARF), CDKN2A (p16INK4a), CHEK2, CTNNA1, DICER1, EPCAM (Deletion/duplication testing only), GREM1 (promoter region deletion/duplication testing only), KIT, MEN1, MLH1, MSH2, MSH3, MSH6, MUTYH, NBN, NF1, NHTL1, PALB2, PDGFRA, PMS2, POLD1, POLE, PTEN, RAD50, RAD51C, RAD51D, RNF43, SDHB, SDHC, SDHD, SMAD4, SMARCA4. STK11, TP53, TSC1, TSC2, and VHL.  The following genes were evaluated for sequence changes only: SDHA and HOXB13 c.251G>A variant only.  The test  report will be scanned into EPIC and located under the Molecular Pathology section of the Results Review tab.  A portion of the result report is included below for reference.     We discussed with Ms. Warrior that because current genetic testing is not perfect, it is possible there may be a gene mutation in one of these genes that current testing cannot detect, but that chance is small.  We also discussed, that there could be another gene that has not yet been discovered, or that we have not yet tested, that is responsible for the cancer diagnoses in the family. It is also possible there is a hereditary cause for the cancer in the family that Ms. Strojny did not inherit and therefore was not identified in her testing.  Therefore, it is important to remain in touch with cancer genetics in the future so that we can continue to offer Ms. Westling the most up to date genetic testing.   Genetic testing did identify a variant of uncertain significance (VUS) was identified in the BRCA1 gene called c.2048A>G.  At this time, it is unknown if this variant is associated with increased cancer risk or if this is a normal finding, but most variants such as this get reclassified to being inconsequential. It should not be used to make medical management decisions. With time, we suspect the lab will determine the significance of this variant, if any. If we do learn more about it, we will try to contact Ms. Meldrum to discuss it further. However, it is important to stay in touch with [place]  periodically and keep the address and phone number up to date.  CANCER SCREENING RECOMMENDATIONS: Ms. Orsborn test result is considered negative (normal).  This means that we have not identified a hereditary cause for her personal and family history of cancer at this time. Most cancers happen by chance and this negative test suggests that her cancer may fall into this category.    While reassuring, this does not definitively rule out a hereditary  predisposition to cancer. It is still possible that there could be genetic mutations that are undetectable by current technology. There could be genetic mutations in genes that have not been tested or identified to increase cancer risk.  Therefore, it is recommended she continue to follow the cancer management and screening guidelines provided by her oncology and primary healthcare providers.   An individual's cancer risk and medical management are not determined by genetic test results alone. Overall cancer risk assessment incorporates additional factors, including personal medical history, family history, and any available genetic information that may result in a personalized plan for cancer prevention and surveillance.  RECOMMENDATIONS FOR FAMILY MEMBERS:  Individuals in this family might be at some increased risk of developing cancer, over the general population risk, simply due to the family history of cancer.  We recommended women in this family have a yearly mammogram beginning at age 98, or 63 years younger than the earliest onset of cancer, an annual clinical breast exam, and perform monthly breast self-exams. Women in this family should also have a gynecological exam as recommended by their primary provider. All family members should have a colonoscopy by age 87.  FOLLOW-UP: Lastly, we discussed with Ms. Lesure that cancer genetics is a rapidly advancing field and it is possible that new genetic tests will be appropriate for her and/or her family members in the future. We encouraged her to remain in contact with cancer genetics on an annual basis so we can update her personal and family histories and let her know of advances in cancer genetics that may benefit this family.   Our contact number was provided. Ms. Kincannon questions were answered to her satisfaction, and she knows she is welcome to call us at anytime with additional questions or concerns.   Clint Guy, MS, [facility] Certified Genetic  [email or web address] [email or web address] Phone: [phone]

## 2019-09-24 NOTE — Transfer of Care (Signed)
Immediate Anesthesia Transfer of Care Note  Patient: Paula Davidson  Procedure(s) Performed: RIGHT BREAST LUMPECTOMY WITH SENTINEL LYMPH NODE BIOPSY (Right Breast)  Patient Location: PACU  Anesthesia Type:General  Level of Consciousness: drowsy and patient cooperative  Airway & Oxygen Therapy: Patient Spontanous Breathing and Patient connected to face mask oxygen  Post-op Assessment: Report given to RN and Post -op Vital signs reviewed and stable  Post vital signs: Reviewed and stable  Last Vitals:  Vitals Value Taken Time  BP    Temp    Pulse 73 09/24/19 1104  Resp 13 09/24/19 1104  SpO2 100 % 09/24/19 1104  Vitals shown include unvalidated device data.  Last Pain:  Vitals:   09/24/19 0900  TempSrc:   PainSc: 0-No pain         Complications: No apparent anesthesia complications

## 2019-09-25 ENCOUNTER — Encounter (HOSPITAL_BASED_OUTPATIENT_CLINIC_OR_DEPARTMENT_OTHER): Payer: Self-pay | Admitting: General Surgery

## 2019-09-25 NOTE — Addendum Note (Signed)
Addendum  created 09/25/19 1212 by Barnet Glasgow, MD   Clinical Note Signed, Intraprocedure Blocks edited

## 2019-09-26 LAB — SURGICAL PATHOLOGY

## 2019-09-27 ENCOUNTER — Telehealth: Payer: Self-pay | Admitting: *Deleted

## 2019-09-27 NOTE — Telephone Encounter (Signed)
Ordered mammaprint per Dr. Burr Medico.  Faxed requisition to pathology and [facility]endia

## 2019-09-28 DIAGNOSIS — E038 Other specified hypothyroidism: Secondary | ICD-10-CM | POA: Insufficient documentation

## 2019-10-02 ENCOUNTER — Other Ambulatory Visit: Payer: BLUE CROSS/BLUE SHIELD

## 2019-10-08 ENCOUNTER — Telehealth: Payer: Self-pay | Admitting: *Deleted

## 2019-10-08 NOTE — Telephone Encounter (Signed)
Received mammaprint results of high risk.  Patient is aware and confirmed virtual  appointment with Dr. Burr Medico for 10/09/19 at 14opm.

## 2019-10-08 NOTE — Progress Notes (Addendum)
[place]   [phone] [phone]   Clinic Follow up Note   Patient Care Team: Burnett Sheng, MD as PCP - General (Family Medicine) Mauro Kaufmann, RN as Oncology Nurse Navigator Rockwell Germany, RN as Oncology Nurse Navigator Jovita Kussmaul, MD as Consulting Physician (General Surgery) Truitt Merle, MD as Consulting Physician (Hematology) Eppie Gibson, MD as Attending Physician (Radiation Oncology)   I connected with Theora Gianotti on 10/09/2019 at  1:40 PM EST by video enabled telemedicine visit and verified that I am speaking with the correct person using two identifiers.  I discussed the limitations, risks, security and privacy concerns of performing an evaluation and management service by telephone and the availability of in person appointments. I also discussed with the patient that there may be a patient responsible charge related to this service. The patient expressed understanding and agreed to proceed.   Patient's location:  Her home  Provider's location:  My Office  CHIEF COMPLAINT: F/u of right breast cancer   SUMMARY OF ONCOLOGIC HISTORY: Oncology History Overview Note  Cancer Staging Malignant neoplasm of upper-outer quadrant of right breast in female, estrogen receptor positive ([place]) Staging form: Breast, AJCC 8th Edition - Clinical stage from 08/28/2019: Stage IB (cT2, cN0, cM0, G2, ER+, PR+, HER2-) - Signed by Truitt Merle, MD on 09/04/2019 - Pathologic stage from 09/24/2019: Stage IB (pT2, pN1a, cM0, G2, ER+, PR+, HER2-) - Signed by Truitt Merle, MD on 10/10/2019    Malignant neoplasm of upper-outer quadrant of right breast in female, estrogen receptor positive ([place])  08/24/2019 Mammogram   Diagnostic mammogram and [place] 08/24/19 IMPRESSION: Suspicious right breast mass 10 o'clock 1 cm from the nipple measuring 3.3 x 1.9 x 1.9 cm and axillary adenopathy.   08/28/2019 Initial Biopsy   Diagnosis 08/28/19 1. Breast, right, needle core  biopsy, 10 o'clock - INVASIVE MAMMARY CARCINOMA, GRADE II. - SEE MICROSCOPIC DESCRIPTION. 2. Lymph node, needle/core biopsy, right axilla - BENIGN LYMPH NODE. - NO METASTATIC CARCINOMA IDENTIFIED.    08/28/2019 Receptors her2   Results: IMMUNOHISTOCHEMICAL AND MORPHOMETRIC ANALYSIS PERFORMED MANUALLY The tumor cells are NEGATIVE for Her2 (1+). Estrogen Receptor: 100%, POSITIVE, STRONG STAINING INTENSITY Progesterone Receptor: 100%, POSITIVE, STRONG STAINING INTENSITY Proliferation Marker Ki67: 10%   08/28/2019 Cancer Staging   Staging form: Breast, AJCC 8th Edition - Clinical stage from 08/28/2019: Stage IB (cT2, cN0, cM0, G2, ER+, PR+, HER2-) - Signed by Truitt Merle, MD on 09/04/2019   08/31/2019 Initial Diagnosis   Malignant neoplasm of upper-outer quadrant of right breast in female, estrogen receptor positive ([place])   09/07/2019 Breast MRI   Breast MRI 09/07/19  IMPRESSION: 1. 2.5 cm known malignancy in the anterior right breast, superficial depth near the nipple. 2. 7-8 mm indeterminate mass in the central upper outer quadrant of the right breast. 3. One right axillary lymph node with apparent mild cortical thickening, which lies slightly anterior and inferior to the biopsied right axillary lymph node ( benign reactive on pathology). This is most likely also benign given the pathology from the adjacent biopsied lymph node. 4. No evidence of left breast malignancy.   09/17/2019 Imaging   [place] right breast 09/17/19  IMPRESSION: Several benign appearing masses in the upper outer quadrant of the right breast without a definite correlate to the finding seen on prior MRI. The right axillary lymph nodes appear similar to the recently biopsied benign lymph node.   09/20/2019 Pathology Results   Diagnosis 09/20/19  Breast, right, needle core biopsy,  upper outer quadrant - FIBROADENOMA - NO MALIGNANCY IDENTIFIED    09/21/2019 Genetic Testing   Negative genetic testing:  No  pathogenic variants detected on the [facility] Breast Cancer STAT panel and Common Hereditary Cancers panel.  A variant of uncertain significance was identified in the BRCA1 gene, called c.2048A>G ([id number]).  The report date is 09/21/2019.  The STAT Breast cancer panel offered by [facility] includes sequencing and rearrangement analysis for the following 9 genes:  ATM, BRCA1, BRCA2, CDH1, CHEK2, PALB2, PTEN, STK11 and TP53.   The Common Hereditary Cancers Panel offered by [facility] includes sequencing and/or deletion duplication testing of the following 48 genes: APC, ATM, AXIN2, BARD1, BMPR1A, BRCA1, BRCA2, BRIP1, CDH1, CDK4, CDKN2A (p14ARF), CDKN2A (p16INK4a), CHEK2, CTNNA1, DICER1, EPCAM (Deletion/duplication testing only), GREM1 (promoter region deletion/duplication testing only), KIT, MEN1, MLH1, MSH2, MSH3, MSH6, MUTYH, NBN, NF1, NHTL1, PALB2, PDGFRA, PMS2, POLD1, POLE, PTEN, RAD50, RAD51C, RAD51D, RNF43, SDHB, SDHC, SDHD, SMAD4, SMARCA4. STK11, TP53, TSC1, TSC2, and VHL.  The following genes were evaluated for sequence changes only: SDHA and HOXB13 c.251G>A variant only.    09/24/2019 Surgery   RIGHT BREAST LUMPECTOMY WITH SENTINEL LYMPH NODE BIOPSY by Dr Marlou Starks  09/24/19    09/24/2019 Pathology Results   FINAL MICROSCOPIC DIAGNOSIS: 09/24/19   A. LYMPH NODE, RIGHT #1, SENTINEL, BIOPSY:  - There is no evidence of carcinoma in 1 of 1 lymph node (0/1).   B. LYMPH NODE, RIGHT, SENTINEL, BIOPSY:  - There is no evidence of carcinoma in 1 of 1 lymph node (0/1).   C. 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   D. LYMPH NODE, RIGHT #2, SENTINEL, BIOPSY:  - There is no evidence of carcinoma in 1 of 1 lymph node (0/1).   E. LYMPH NODE, RIGHT #3 , SENTINEL, BIOPSY:  - There is no evidence of carcinoma in 1 of 1 lymph node (0/1).   F. LYMPH NODE, RIGHT, SENTINEL, BIOPSY:  - There is no evidence of carcinoma in 1 of 1 lymph node (0/1).   G. LYMPH NODE,  RIGHT, SENTINEL, BIOPSY:  -There is no evidence of carcinoma in 1 of 1 lymph node (0/1).   H. 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  - Ductal carcinoma in situ, intermediate grade.  - Perineural invasion is identified.  - The surgical resection margins are negative for carcinoma.  - See oncology table below   I. BREAST, RIGHT ADDITIONAL INFERIOR MARGIN, EXCISION:  - Fibrocystic changes.  - There is no evidence of malignancy.      Chemotherapy   PENDING AC q2weeks for 4 cycles followed by weekly Taxol for 12 weeks     09/24/2019 Cancer Staging   Staging form: Breast, AJCC 8th Edition - Pathologic stage from 09/24/2019: Stage IB (pT2, pN1a, cM0, G2, ER+, PR+, HER2-) - Signed by Truitt Merle, MD on 10/10/2019      CURRENT THERAPY:  PENDING AC q2weeks for 4 cycles followed by weekly Taxol for 12 weeks   INTERVAL HISTORY:  Paula Davidson is here for a follow up. They identified themselves by face to face video with her family. She notes she is doing well. Her incision is healing well and was seen by Dr. Marlou Starks today. She denies any significant pain or reduced ROM from surgery. She has already returned to work. She feels she has adequate support at home while on treatment. She notes she may have to take off  work sometimes.    REVIEW OF SYSTEMS:   Constitutional: Denies fevers, chills or abnormal weight loss Eyes: Denies blurriness of vision Ears, nose, mouth, throat, and face: Denies mucositis or sore throat Respiratory: Denies cough, dyspnea or wheezes Cardiovascular: Denies palpitation, chest discomfort or lower extremity swelling Gastrointestinal:  Denies nausea, heartburn or change in bowel habits Skin: Denies abnormal skin rashes Lymphatics: Denies new lymphadenopathy or easy bruising Neurological:Denies numbness, tingling or new weaknesses Behavioral/Psych: Mood is stable, no new changes  All other systems were reviewed with the  patient and are negative.  MEDICAL HISTORY:  Past Medical History:  Diagnosis Date  . Anxiety   . Depression   . DUB (dysfunctional uterine bleeding)   . Family history of pancreatic cancer   . Hypertension     SURGICAL HISTORY: Past Surgical History:  Procedure Laterality Date  . BREAST LUMPECTOMY WITH AXILLARY LYMPH NODE BIOPSY Right 09/24/2019   Procedure: RIGHT BREAST LUMPECTOMY WITH SENTINEL LYMPH NODE BIOPSY;  Surgeon: Jovita Kussmaul, MD;  Location: [facility];  Service: General;  Laterality: Right;  . BREAST SURGERY     Breast lump excised  . FOOT SURGERY    . LAPAROSCOPIC GASTRIC BANDING      I have reviewed the social history and family history with the patient and they are unchanged from previous note.  ALLERGIES:  is allergic to codeine.  MEDICATIONS:  Current Outpatient Medications  Medication Sig Dispense Refill  . amLODipine (NORVASC) 5 MG tablet Take 5 mg by mouth daily.    . Calcium Carbonate-Vitamin D (CALCIUM + D PO) Take by mouth.    . citalopram (CELEXA) 10 MG tablet Take 10 mg by mouth daily.    Marland Kitchen Fexofenadine HCl (ALLEGRA PO) Take by mouth.    Marland Kitchen HYDROcodone-acetaminophen (NORCO/VICODIN) 5-325 MG tablet Take 1-2 tablets by mouth every 6 (six) hours as needed for moderate pain or severe pain. 15 tablet 0  . HYDROcodone-acetaminophen (NORCO/VICODIN) 5-325 MG tablet Take 1-2 tablets by mouth every 6 (six) hours as needed for moderate pain or severe pain. 15 tablet 0  . LOSARTAN POTASSIUM PO Take by mouth.    . metoprolol succinate (TOPROL-XL) 25 MG 24 hr tablet Take 25 mg by mouth daily.    . Multiple Vitamin (MULTIVITAMIN) tablet Take 1 tablet by mouth daily.    . traMADol (ULTRAM) 50 MG tablet Take 1-2 tablets (50-100 mg total) by mouth every 6 (six) hours as needed. 20 tablet 0  . traMADol (ULTRAM) 50 MG tablet Take 1-2 tablets (50-100 mg total) by mouth every 6 (six) hours as needed. 20 tablet 0   No current facility-administered  medications for this visit.     PHYSICAL EXAMINATION: ECOG PERFORMANCE STATUS: 0 - Asymptomatic  No vitals taken today, Exam not performed today   LABORATORY DATA:  I have reviewed the data as listed CBC Latest Ref Rng & Units 09/05/2019  WBC 4.0 - 10.5 K/uL 6.2  Hemoglobin 12.0 - 15.0 g/dL 12.1  Hematocrit 36.0 - 46.0 % 37.4  Platelets 150 - 400 K/uL 442(H)     CMP Latest Ref Rng & Units 09/05/2019  Glucose 70 - 99 mg/dL 115(H)  BUN 6 - 20 mg/dL 9  Creatinine 0.44 - 1.00 mg/dL 0.76  Sodium 135 - 145 mmol/L 142  Potassium 3.5 - 5.1 mmol/L 3.7  Chloride 98 - 111 mmol/L 106  CO2 22 - 32 mmol/L 24  Calcium 8.9 - 10.3 mg/dL 9.6  Total Protein 6.5 - 8.1  g/dL 8.0  Total Bilirubin 0.3 - 1.2 mg/dL 0.4  Alkaline Phos 38 - 126 U/L 63  AST 15 - 41 U/L 14(L)  ALT 0 - 44 U/L 17      RADIOGRAPHIC STUDIES: I have personally reviewed the radiological images as listed and agreed with the findings in the report. No results found.   ASSESSMENT & PLAN:  Xee Hollman is a 48 y.o. female with   1 Malignant neoplasm of upper-outer quadrant of right breast, Stage IB, pT2N1aM0, ER/PR+, HER2-, Grade II -She was recently diagnosed in 08/2019. She underwent right lumpectomy and SLNB with Dr. Marlou Starks on 09/24/19.  -We discussed her pathology report which shows 2.4cm tumor removed with clear margins and 1/7 positive lymph nodes. Her Mammaprint showed Luminal Type B, high risk for recurrence at 29% in the next 10 years.  -With positive lymph node, CT CAP and bone scan is recommended to rule out distant metastasis. She is agreeable.  -Given her high risk features, I recommend adjuvant chemotherapy to reduce her risk of recurrence. I discussed chemo option of more intensive AC q2weeks for 4 cycles then weekly Taxol for 12 weeks, or less intensive TC q3weeks for 4 cycles. Given her positive LN and young age, I recommend AC-T. She is agreeable. Goal of care is curative.   --Chemotherapy consent: Side  effects including but does not limited to, fatigue, nausea, vomiting, diarrhea, hair loss, neuropathy, fluid retention, renal and kidney dysfunction, neutropenic fever, needed for blood transfusion, bleeding, heart failure, small risk of MDS and leukemia,  were discussed with patient in great detail. She is interested.  -I also discussed adjuvant radiation to reduce her risk of local recurrence to right breast followed by antiestrogen therapy to reduce her risk of distant recurrence.  -I discussed option of Dignicap to reduce hair loss. She will think about it.  -Before start of chemo will take chemo education class. Plan to start chemo in 2-3 weeks  -Will monitor her heart function on Adriamycin with baseline and repeat Echo.  -With longterm use of IV treatment, PAC placenta was discussed. She is agreeable.  -f/u on 12/11 for first cycle chemo     2. Genetic Testing was negative for pathogenetic mutations.  -There is A variant of uncertain significance was identified in the BRCA1 gene, called c.2048A>G (p.Lys683Arg).     3. Menorrhagia  -She severe menorrhagia with long term bleeding. She previously required IV iron  -Her Gyn started Mirena 5 years ago which stopped her periods.  -Given her ER/PR positive breast cancer, she had her Mirena removed in 09/2019.   -I discussed given her age she may start menopause soon. Will test her hormonal level some time in 09/2020   4. HTN, Depression, Anxiety, obesity  -On Amlodipine, Losartan, Lopressor -She notes her BP has been high since her mother died from her HTN. -She is also on Celexa.  -She notes she lives with roommate.    PLAN:  -Send SW consult for depression counseling -CT CAP W Contrast/Bone scan in 1-2 weeks -Chemo education class  -Echo  -PAC placement by Dr.Toth  -Lab, flush, f/u and chemo AC on 12/11 with GCSF, I will call in Emla cream, zofran and compazine    No problem-specific Assessment & Plan notes found for  this encounter.   No orders of the defined types were placed in this encounter.  I discussed the assessment and treatment plan with the patient. The patient was provided an opportunity to ask questions and  all were answered. The patient agreed with the plan and demonstrated an understanding of the instructions.  The patient was advised to call back or seek an in-person evaluation if the symptoms worsen or if the condition fails to improve as anticipated.  I provided 40 minutes of face-to-face video visit time during this encounter, and > 50% was spent counseling as documented under my assessment & plan.    Truitt Merle, MD 10/09/2019   I, Joslyn Devon, am acting as scribe for Truitt Merle, MD.   I have reviewed the above documentation for accuracy and completeness, and I agree with the above.

## 2019-10-09 ENCOUNTER — Inpatient Hospital Stay (HOSPITAL_BASED_OUTPATIENT_CLINIC_OR_DEPARTMENT_OTHER): Payer: BLUE CROSS/BLUE SHIELD | Admitting: Hematology

## 2019-10-09 DIAGNOSIS — Z170 Estrogen receptor positive status [ER+]: Secondary | ICD-10-CM | POA: Diagnosis not present

## 2019-10-09 DIAGNOSIS — C50411 Malignant neoplasm of upper-outer quadrant of right female breast: Secondary | ICD-10-CM

## 2019-10-10 ENCOUNTER — Encounter: Payer: Self-pay | Admitting: Hematology

## 2019-10-10 MED ORDER — LIDOCAINE-PRILOCAINE 2.5-2.5 % EX CREA: TOPICAL_CREAM | CUTANEOUS | 3 refills | Status: DC

## 2019-10-10 MED ORDER — ONDANSETRON HCL 8 MG PO TABS: 8.0000 mg | ORAL_TABLET | Freq: Two times a day (BID) | ORAL | 1 refills | Status: DC | PRN

## 2019-10-10 MED ORDER — PROCHLORPERAZINE MALEATE 10 MG PO TABS: 10.0000 mg | ORAL_TABLET | Freq: Four times a day (QID) | ORAL | 1 refills | Status: DC | PRN

## 2019-10-10 NOTE — Progress Notes (Signed)
START ON PATHWAY REGIMEN - Breast   Dose-Dense AC q14 days:   A cycle is every 14 days:     Doxorubicin      Cyclophosphamide      [id number]   **Always confirm dose/schedule in your pharmacy ordering system**  Paclitaxel 80 mg/m2 Weekly:   Administer weekly:     Paclitaxel   **Always confirm dose/schedule in your pharmacy ordering system**  Patient Characteristics: Postoperative without Neoadjuvant Therapy (Pathologic Staging), Invasive Disease, Adjuvant Therapy, HER2 Negative/Unknown/Equivocal, ER Positive, Node Positive, Node Positive (1-3), MammaPrint(R) Ordered, High Genomic Risk Therapeutic Status: Postoperative without Neoadjuvant Therapy (Pathologic Staging) AJCC Grade: G2 AJCC N Category: pN1a AJCC M Category: cM0 ER Status: Positive (+) AJCC 8 Stage Grouping: IB HER2 Status: Negative (-) Oncotype Dx Recurrence Score: Ordered Other Genomic Test AJCC T Category: pT2 PR Status: Positive (+) Has this patient completed genomic testing<= Yes - MammaPrint(R) MammaPrint(R) Score: High Genomic Risk Intent of Therapy: Curative Intent, Discussed with Patient 

## 2019-10-12 ENCOUNTER — Telehealth: Payer: Self-pay | Admitting: Hematology

## 2019-10-12 ENCOUNTER — Encounter (HOSPITAL_COMMUNITY): Payer: Self-pay | Admitting: Hematology

## 2019-10-12 NOTE — Telephone Encounter (Signed)
Appointments scheduled based upon 11/24 los and 11/27 schedule message. Per schedule message. Start date changed. Confirmed first appointments for 12/11, 12/14 and 12/16 with patient. Patient will get updated schedule at next visit.

## 2019-10-15 ENCOUNTER — Encounter (HOSPITAL_COMMUNITY): Payer: Self-pay | Admitting: Family Medicine

## 2019-10-15 ENCOUNTER — Encounter: Payer: Self-pay | Admitting: *Deleted

## 2019-10-15 NOTE — Progress Notes (Signed)
[place] Clinical Social Work   Clinical Social Work received referral from Futures trader for depression and emotional support.  CSW contacted patient at home to offer support and assess for needs.  CSW left patient a message offering support and information on the support team and services at [facility].  CSW provided contact information and encouraged patient to return call.  Johnnye Lana, MSW, LCSW, OSW-C Clinical Social Worker [facility] [phone]

## 2019-10-16 ENCOUNTER — Ambulatory Visit: Payer: Self-pay | Admitting: General Surgery

## 2019-10-17 ENCOUNTER — Encounter: Payer: Self-pay | Admitting: General Practice

## 2019-10-17 NOTE — Progress Notes (Signed)
[place] CSW Progress Notes  Request received from medical oncologist to reach out to patient w support/resources.  Unable to reach patient by phone, left VM w my contact information and requested return call.  Also scheduled social work visit for her next visit to [facility] on 12/11 at 3:30 PM.  Edwyna Shell, [place] Worker Phone:  [phone]

## 2019-10-19 NOTE — Patient Instructions (Addendum)
DUE TO COVID-19 ONLY ONE VISITOR IS ALLOWED TO COME WITH YOU AND STAY IN THE WAITING ROOM ONLY DURING PRE OP AND PROCEDURE DAY OF SURGERY. THE 1 VISITOR MAY VISIT WITH YOU AFTER SURGERY IN YOUR PRIVATE ROOM DURING VISITING HOURS ONLY!  YOU NEED TO HAVE A COVID 19 TEST ON_Tuesday 10/23/2019 ______ @__0925  am_____, THIS TEST MUST BE DONE BEFORE SURGERY, COME  [place], [address] , [id number].  ([place]) ONCE YOUR COVID TEST IS COMPLETED, PLEASE BEGIN THE QUARANTINE INSTRUCTIONS AS OUTLINED IN YOUR HANDOUT.                Paula Davidson    Your procedure is scheduled on: Thursday 10/25/2019   Report to [facility] Main  Entrance    Report to admitting at  0900  AM     Call this number if you have problems the morning of surgery [phone]    Remember: Do not eat food or drink liquids :After Midnight.    BRUSH YOUR TEETH MORNING OF SURGERY AND RINSE YOUR MOUTH OUT, NO CHEWING GUM CANDY OR MINTS.     Take these medicines the morning of surgery with A SIP OF WATER: Citalopram (Celexa), Amlodipine (Norvasc)                                 You may not have any metal on your body including hair pins and              piercings  Do not wear jewelry, make-up, lotions, powders or perfumes, deodorant             Do not wear nail polish on your fingernails.  Do not shave  48 hours prior to surgery.            Do not bring valuables to the hospital. [place].  Contacts, dentures or bridgework may not be worn into surgery.  Leave suitcase in the car. After surgery it may be brought to your room.     Patients discharged the day of surgery will not be allowed to drive home. IF YOU ARE HAVING SURGERY AND GOING HOME THE SAME DAY, YOU MUST HAVE AN ADULT TO DRIVE YOU HOME AND BE WITH YOU FOR 24 HOURS. YOU MAY GO HOME BY TAXI OR UBER OR ORTHERWISE, BUT AN ADULT MUST ACCOMPANY YOU HOME AND STAY WITH YOU FOR 24  HOURS.  Name and phone number of your driver:friend- Alan Ripper                Please read over the following fact sheets you were given: _____________________________________________________________________             [facility] - Preparing for Surgery Before surgery, you can play an important role.  Because skin is not sterile, your skin needs to be as free of germs as possible.  You can reduce the number of germs on your skin by washing with CHG (chlorahexidine gluconate) soap before surgery.  CHG is an antiseptic cleaner which kills germs and bonds with the skin to continue killing germs even after washing. Please DO NOT use if you have an allergy to CHG or antibacterial soaps.  If your skin becomes reddened/irritated stop using the CHG and inform your nurse when you arrive at Short Stay.  Do not shave (including legs and underarms) for at least 48 hours prior to the first CHG shower.  You may shave your face/neck. Please follow these instructions carefully:  1.  Shower with CHG Soap the night before surgery and the  morning of Surgery.  2.  If you choose to wash your hair, wash your hair first as usual with your  normal  shampoo.  3.  After you shampoo, rinse your hair and body thoroughly to remove the  shampoo.                           4.  Use CHG as you would any other liquid soap.  You can apply chg directly  to the skin and wash                       Gently with a scrungie or clean washcloth.  5.  Apply the CHG Soap to your body ONLY FROM THE NECK DOWN.   Do not use on face/ open                           Wound or open sores. Avoid contact with eyes, ears mouth and genitals (private parts).                       Wash face,  Genitals (private parts) with your normal soap.             6.  Wash thoroughly, paying special attention to the area where your surgery  will be performed.  7.  Thoroughly rinse your body with warm water from the neck down.  8.  DO NOT shower/wash with your  normal soap after using and rinsing off  the CHG Soap.                9.  Pat yourself dry with a clean towel.            10.  Wear clean pajamas.            11.  Place clean sheets on your bed the night of your first shower and do not  sleep with pets. Day of Surgery : Do not apply any lotions/deodorants the morning of surgery.  Please wear clean clothes to the hospital/surgery center.  FAILURE TO FOLLOW THESE INSTRUCTIONS MAY RESULT IN THE CANCELLATION OF YOUR SURGERY PATIENT SIGNATURE_________________________________  NURSE SIGNATURE__________________________________  ________________________________________________________________________

## 2019-10-22 ENCOUNTER — Other Ambulatory Visit (HOSPITAL_COMMUNITY): Payer: BLUE CROSS/BLUE SHIELD

## 2019-10-22 ENCOUNTER — Ambulatory Visit: Payer: BLUE CROSS/BLUE SHIELD | Attending: General Surgery | Admitting: Physical Therapy

## 2019-10-23 ENCOUNTER — Other Ambulatory Visit: Payer: Self-pay

## 2019-10-23 ENCOUNTER — Encounter (HOSPITAL_COMMUNITY): Payer: Self-pay

## 2019-10-23 ENCOUNTER — Other Ambulatory Visit (HOSPITAL_COMMUNITY)
Admission: RE | Admit: 2019-10-23 | Discharge: 2019-10-23 | Disposition: A | Discharge status: Home / Self Care | Payer: BLUE CROSS/BLUE SHIELD | Source: Ambulatory Visit | Attending: General Surgery | Admitting: General Surgery

## 2019-10-23 ENCOUNTER — Encounter (HOSPITAL_COMMUNITY)
Admission: RE | Admit: 2019-10-23 | Discharge: 2019-10-23 | Disposition: A | Discharge status: Home / Self Care | Payer: BLUE CROSS/BLUE SHIELD | Source: Ambulatory Visit | Attending: General Surgery | Admitting: General Surgery

## 2019-10-23 DIAGNOSIS — Z01812 Encounter for preprocedural laboratory examination: Secondary | ICD-10-CM | POA: Insufficient documentation

## 2019-10-23 DIAGNOSIS — Z20828 Contact with and (suspected) exposure to other viral communicable diseases: Secondary | ICD-10-CM | POA: Diagnosis not present

## 2019-10-23 LAB — CBC
HCT: 36.7 % (ref 36.0–46.0)
Hemoglobin: 11.8 g/dL — ABNORMAL LOW (ref 12.0–15.0)
MCH: 26.5 pg (ref 26.0–34.0)
MCHC: 32.2 g/dL (ref 30.0–36.0)
MCV: 82.5 fL (ref 80.0–100.0)
Platelets: 423 10*3/uL — ABNORMAL HIGH (ref 150–400)
RBC: 4.45 MIL/uL (ref 3.87–5.11)
RDW: 15.9 % — ABNORMAL HIGH (ref 11.5–15.5)
WBC: 6.9 10*3/uL (ref 4.0–10.5)
nRBC: 0 % (ref 0.0–0.2)

## 2019-10-23 LAB — BASIC METABOLIC PANEL
Anion gap: 10 (ref 5–15)
BUN: 6 mg/dL (ref 6–20)
CO2: 25 mmol/L (ref 22–32)
Calcium: 9.7 mg/dL (ref 8.9–10.3)
Chloride: 103 mmol/L (ref 98–111)
Creatinine, Ser: 0.62 mg/dL (ref 0.44–1.00)
GFR calc Af Amer: >60 mL/min (ref >60)
GFR calc non Af Amer: >60 mL/min (ref >60)
Glucose, Bld: 101 mg/dL — ABNORMAL HIGH (ref 70–99)
Potassium: 3.8 mmol/L (ref 3.5–5.1)
Sodium: 138 mmol/L (ref 135–145)

## 2019-10-23 LAB — SARS CORONAVIRUS 2 (TAT 6-24 HRS): SARS Coronavirus 2: NEGATIVE

## 2019-10-23 NOTE — Progress Notes (Signed)
PCP - Dr. Burnett Sheng Cardiologist - n/a Oncology- Dr. Burr Medico  LOV-10/09/2019 video visit  Chest x-ray - n/a  To be done on 10/26/2019-CT chest w/contrast, NM bone scan whole body, CT abd.pelvis w/ contrast  EKG - 09/17/2019 EPIC Stress Test - n/a ECHO - to be done on 10/26/2019 Cardiac Cath - n/a  Sleep Study - n/a CPAP - n/a  Fasting Blood Sugar - n/a Checks Blood Sugar _0____ times a day  Blood Thinner Instructions:n/a Aspirin Instructions:n/a Last Dose:  Anesthesia review:   Patient has a history of HTN and right breast cancer.  Patient denies shortness of breath, fever, cough and chest pain at PAT appointment   Patient verbalized understanding of instructions that were given to them at the PAT appointment. Patient was also instructed that they will need to review over the PAT instructions again at home before surgery.

## 2019-10-25 ENCOUNTER — Encounter (HOSPITAL_COMMUNITY)
Admission: RE | Disposition: A | Discharge status: Home / Self Care | Payer: Self-pay | Source: Home / Self Care | Attending: General Surgery

## 2019-10-25 ENCOUNTER — Encounter (HOSPITAL_COMMUNITY): Payer: Self-pay | Admitting: General Surgery

## 2019-10-25 ENCOUNTER — Ambulatory Visit (HOSPITAL_COMMUNITY): Payer: BLUE CROSS/BLUE SHIELD

## 2019-10-25 ENCOUNTER — Ambulatory Visit (HOSPITAL_COMMUNITY): Payer: BLUE CROSS/BLUE SHIELD | Admitting: Anesthesiology

## 2019-10-25 ENCOUNTER — Ambulatory Visit (HOSPITAL_COMMUNITY)
Admission: RE | Admit: 2019-10-25 | Discharge: 2019-10-25 | Disposition: A | Discharge status: Home / Self Care | Payer: BLUE CROSS/BLUE SHIELD | Attending: General Surgery | Admitting: General Surgery

## 2019-10-25 ENCOUNTER — Ambulatory Visit (HOSPITAL_COMMUNITY): Payer: BLUE CROSS/BLUE SHIELD | Admitting: Physician Assistant

## 2019-10-25 DIAGNOSIS — Z170 Estrogen receptor positive status [ER+]: Secondary | ICD-10-CM | POA: Insufficient documentation

## 2019-10-25 DIAGNOSIS — C50411 Malignant neoplasm of upper-outer quadrant of right female breast: Secondary | ICD-10-CM | POA: Diagnosis present

## 2019-10-25 DIAGNOSIS — Z95828 Presence of other vascular implants and grafts: Secondary | ICD-10-CM

## 2019-10-25 HISTORY — PX: PORTACATH PLACEMENT: SHX2246

## 2019-10-25 SURGERY — INSERTION, TUNNELED CENTRAL VENOUS DEVICE, WITH PORT
Anesthesia: General | Site: Neck

## 2019-10-25 MED ORDER — HYDROCODONE-ACETAMINOPHEN 5-325 MG PO TABS: 1.0000 | ORAL_TABLET | Freq: Four times a day (QID) | ORAL | 0 refills | Status: DC | PRN

## 2019-10-25 MED ORDER — PHENYLEPHRINE 40 MCG/ML (10ML) SYRINGE FOR IV PUSH (FOR BLOOD PRESSURE SUPPORT)
PREFILLED_SYRINGE | INTRAVENOUS | Status: AC
  Filled 2019-10-25: qty 10

## 2019-10-25 MED ORDER — ONDANSETRON HCL 4 MG/2ML IJ SOLN
INTRAMUSCULAR | Status: DC | PRN
  Administered 2019-10-25: 4 mg via INTRAVENOUS

## 2019-10-25 MED ORDER — CELECOXIB 200 MG PO CAPS
200.0000 mg | ORAL_CAPSULE | ORAL | Status: AC
  Administered 2019-10-25: 200 mg via ORAL
  Filled 2019-10-25: qty 1

## 2019-10-25 MED ORDER — SODIUM CHLORIDE 0.9 % IR SOLN
Status: DC | PRN
  Administered 2019-10-25: 1

## 2019-10-25 MED ORDER — GABAPENTIN 300 MG PO CAPS
300.0000 mg | ORAL_CAPSULE | ORAL | Status: AC
  Administered 2019-10-25: 300 mg via ORAL
  Filled 2019-10-25: qty 1

## 2019-10-25 MED ORDER — LIDOCAINE 2% (20 MG/ML) 5 ML SYRINGE
INTRAMUSCULAR | Status: DC | PRN
  Administered 2019-10-25: 100 mg via INTRAVENOUS

## 2019-10-25 MED ORDER — HEPARIN SOD (PORK) LOCK FLUSH 100 UNIT/ML IV SOLN
INTRAVENOUS | Status: AC
  Filled 2019-10-25: qty 5

## 2019-10-25 MED ORDER — CEFAZOLIN SODIUM-DEXTROSE 2-4 GM/100ML-% IV SOLN
2.0000 g | INTRAVENOUS | Status: AC
  Administered 2019-10-25: 2 g via INTRAVENOUS
  Filled 2019-10-25: qty 100

## 2019-10-25 MED ORDER — FENTANYL CITRATE (PF) 100 MCG/2ML IJ SOLN
INTRAMUSCULAR | Status: DC | PRN
  Administered 2019-10-25 (×2): 50 ug via INTRAVENOUS

## 2019-10-25 MED ORDER — BUPIVACAINE HCL (PF) 0.25 % IJ SOLN
INTRAMUSCULAR | Status: DC | PRN
  Administered 2019-10-25: 15 mL

## 2019-10-25 MED ORDER — CEFAZOLIN SODIUM-DEXTROSE 2-4 GM/100ML-% IV SOLN
INTRAVENOUS | Status: AC
  Filled 2019-10-25: qty 100

## 2019-10-25 MED ORDER — BUPIVACAINE HCL (PF) 0.25 % IJ SOLN
INTRAMUSCULAR | Status: AC
  Filled 2019-10-25: qty 30

## 2019-10-25 MED ORDER — HEPARIN SOD (PORK) LOCK FLUSH 100 UNIT/ML IV SOLN
INTRAVENOUS | Status: DC | PRN
  Administered 2019-10-25: 400 [IU]

## 2019-10-25 MED ORDER — CHLORHEXIDINE GLUCONATE CLOTH 2 % EX PADS: 6.0000 | MEDICATED_PAD | Freq: Once | CUTANEOUS | Status: DC

## 2019-10-25 MED ORDER — LACTATED RINGERS IV SOLN
INTRAVENOUS | Status: DC
  Administered 2019-10-25: 10:00:00 via INTRAVENOUS

## 2019-10-25 MED ORDER — CEFAZOLIN SODIUM-DEXTROSE 1-4 GM/50ML-% IV SOLN
INTRAVENOUS | Status: DC | PRN
  Administered 2019-10-25: 1 g via INTRAVENOUS

## 2019-10-25 MED ORDER — MIDAZOLAM HCL 2 MG/2ML IJ SOLN
INTRAMUSCULAR | Status: AC
  Filled 2019-10-25: qty 2

## 2019-10-25 MED ORDER — FENTANYL CITRATE (PF) 100 MCG/2ML IJ SOLN: 25.0000 ug | INTRAMUSCULAR | Status: DC | PRN

## 2019-10-25 MED ORDER — DEXAMETHASONE SODIUM PHOSPHATE 10 MG/ML IJ SOLN
INTRAMUSCULAR | Status: DC | PRN
  Administered 2019-10-25: 10 mg via INTRAVENOUS

## 2019-10-25 MED ORDER — PHENYLEPHRINE 40 MCG/ML (10ML) SYRINGE FOR IV PUSH (FOR BLOOD PRESSURE SUPPORT)
PREFILLED_SYRINGE | INTRAVENOUS | Status: DC | PRN
  Administered 2019-10-25: 120 ug via INTRAVENOUS
  Administered 2019-10-25: 80 ug via INTRAVENOUS

## 2019-10-25 MED ORDER — PROPOFOL 10 MG/ML IV BOLUS
INTRAVENOUS | Status: AC
  Filled 2019-10-25: qty 20

## 2019-10-25 MED ORDER — SODIUM CHLORIDE 0.9 % IV SOLN
Freq: Once | INTRAVENOUS | Status: DC
  Filled 2019-10-25: qty 1.2

## 2019-10-25 MED ORDER — PROPOFOL 10 MG/ML IV BOLUS
INTRAVENOUS | Status: DC | PRN
  Administered 2019-10-25: 200 mg via INTRAVENOUS
  Administered 2019-10-25: 20 mg via INTRAVENOUS

## 2019-10-25 MED ORDER — ACETAMINOPHEN 500 MG PO TABS
1000.0000 mg | ORAL_TABLET | ORAL | Status: AC
  Administered 2019-10-25: 1000 mg via ORAL
  Filled 2019-10-25: qty 2

## 2019-10-25 MED ORDER — MIDAZOLAM HCL 5 MG/5ML IJ SOLN
INTRAMUSCULAR | Status: DC | PRN
  Administered 2019-10-25: 2 mg via INTRAVENOUS

## 2019-10-25 MED ORDER — EPHEDRINE SULFATE-NACL 50-0.9 MG/10ML-% IV SOSY
PREFILLED_SYRINGE | INTRAVENOUS | Status: DC | PRN
  Administered 2019-10-25: 5 mg via INTRAVENOUS

## 2019-10-25 MED ORDER — ONDANSETRON HCL 4 MG/2ML IJ SOLN
INTRAMUSCULAR | Status: AC
  Filled 2019-10-25: qty 2

## 2019-10-25 MED ORDER — FENTANYL CITRATE (PF) 100 MCG/2ML IJ SOLN
INTRAMUSCULAR | Status: AC
  Filled 2019-10-25: qty 2

## 2019-10-25 MED ORDER — SODIUM CHLORIDE 0.9 % IV SOLN
INTRAVENOUS | Status: DC | PRN
  Administered 2019-10-25: 12:00:00

## 2019-10-25 SURGICAL SUPPLY — 40 items
BAG DECANTER FOR FLEXI CONT (MISCELLANEOUS) ×3 IMPLANT
BENZOIN TINCTURE PRP APPL 2/3 (GAUZE/BANDAGES/DRESSINGS) IMPLANT
BLADE HEX COATED 2.75 (ELECTRODE) ×3 IMPLANT
BLADE SURG 15 STRL LF DISP TIS (BLADE) ×1 IMPLANT
BLADE SURG 15 STRL SS (BLADE) ×2
CLOSURE WOUND 1/2 X4 (GAUZE/BANDAGES/DRESSINGS)
COVER WAND RF STERILE (DRAPES) ×3 IMPLANT
DECANTER SPIKE VIAL GLASS SM (MISCELLANEOUS) ×3 IMPLANT
DERMABOND ADVANCED (GAUZE/BANDAGES/DRESSINGS) ×2
DERMABOND ADVANCED .7 DNX12 (GAUZE/BANDAGES/DRESSINGS) ×1 IMPLANT
DRAPE C-ARM 42X120 X-RAY (DRAPES) ×3 IMPLANT
DRAPE LAPAROSCOPIC ABDOMINAL (DRAPES) ×3 IMPLANT
DRSG TEGADERM 2-3/8X2-3/4 SM (GAUZE/BANDAGES/DRESSINGS) IMPLANT
DRSG TEGADERM 4X4.75 (GAUZE/BANDAGES/DRESSINGS) IMPLANT
ELECT REM PT RETURN 15FT ADLT (MISCELLANEOUS) ×3 IMPLANT
GAUZE 4X4 16PLY RFD (DISPOSABLE) ×3 IMPLANT
GAUZE SPONGE 4X4 12PLY STRL (GAUZE/BANDAGES/DRESSINGS) IMPLANT
GLOVE BIO SURGEON STRL SZ7.5 (GLOVE) ×6 IMPLANT
GLOVE BIOGEL PI IND STRL 7.0 (GLOVE) ×1 IMPLANT
GLOVE BIOGEL PI INDICATOR 7.0 (GLOVE) ×2
GOWN STRL REUS W/ TWL XL LVL3 (GOWN DISPOSABLE) ×1 IMPLANT
GOWN STRL REUS W/TWL LRG LVL3 (GOWN DISPOSABLE) ×3 IMPLANT
GOWN STRL REUS W/TWL XL LVL3 (GOWN DISPOSABLE) ×5 IMPLANT
KIT BASIN OR (CUSTOM PROCEDURE TRAY) ×3 IMPLANT
KIT PORT POWER 8FR ISP CVUE (Port) ×3 IMPLANT
KIT TURNOVER KIT A (KITS) ×3 IMPLANT
NEEDLE HYPO 25X1 1.5 SAFETY (NEEDLE) ×3 IMPLANT
NS IRRIG 1000ML POUR BTL (IV SOLUTION) ×3 IMPLANT
PACK BASIC VI WITH GOWN DISP (CUSTOM PROCEDURE TRAY) ×3 IMPLANT
PENCIL SMOKE EVACUATOR (MISCELLANEOUS) ×3 IMPLANT
STRIP CLOSURE SKIN 1/2X4 (GAUZE/BANDAGES/DRESSINGS) IMPLANT
SUT MNCRL AB 4-0 PS2 18 (SUTURE) ×3 IMPLANT
SUT PROLENE 2 0 SH DA (SUTURE) ×3 IMPLANT
SUT SILK 2 0 (SUTURE)
SUT SILK 2-0 30XBRD TIE 12 (SUTURE) IMPLANT
SUT VIC AB 3-0 SH 27 (SUTURE) ×2
SUT VIC AB 3-0 SH 27XBRD (SUTURE) ×1 IMPLANT
SYR 10ML LL (SYRINGE) ×3 IMPLANT
SYR CONTROL 10ML LL (SYRINGE) ×3 IMPLANT
TOWEL OR 17X26 10 PK STRL BLUE (TOWEL DISPOSABLE) ×3 IMPLANT

## 2019-10-25 NOTE — H&P (Signed)
  Paula Davidson Documented: 10/09/2019 11:26 AM Location: [facility] Patient #: [id number] DOB: Apr 30, 1971 Undefined / Language: Cleophus Molt / Race: Black or African American Female   History of Present Illness Paula Davidson. Marlou Starks MD; 10/09/2019 11:52 AM) The patient is a 48 year old female who presents for a follow-up for Breast cancer. The patient is a 48 year old black female who is about 2 weeks status post right breast lumpectomy and sentinel node biopsy for a T2 N1 A right breast cancer that was ER and PR positive and HER-2 negative with a Ki-67 of 10%. She tolerated the surgery well. She denies any significant breast pain. She does state that her mammoprint was high risk in she believes they are going to recommend chemotherapy   Allergies (Tanisha A. Owens Shark, [place]; 10/09/2019 11:27 AM) Codeine and Related  Itching. Allergies Reconciled   Medication History (Tanisha A. Owens Shark, [place]; 10/09/2019 11:27 AM) amLODIPine Besylate ('5MG'$  Tablet, Oral) Active. Calcium 600 (1500 (600 Ca)MG Tablet, Oral) Active. CeleXA ('10MG'$  Tablet, Oral) Active. Allegra Allergy ('180MG'$  Tablet, Oral) Active. Mirena (52 MG) (20MCG/24HR IUD, Intrauterine) Active. Losartan Potassium ('50MG'$  Tablet, Oral) Active. Lopressor HCT (50-'25MG'$  Tablet, Oral) Active. Multiple Vitamin (1 (one) Oral) Active. Medications Reconciled  Vitals (Tanisha A. Brown RMA; 10/09/2019 11:27 AM) 10/09/2019 11:26 AM Weight: 294.6 lb Height: 65in Body Surface Area: 2.33 m Body Mass Index: 49.02 kg/m  Temp.: 97.69F  Pulse: 94 (Regular)  BP: 128/82 (Sitting, Left Arm, Standard)       Physical Exam Eddie Dibbles S. Marlou Starks MD; 10/09/2019 11:52 AM) Breast Note: The right lateral breast incision and axillary incision is healing nicely with no sign of infection or seroma Gen: Well-developed well-nourished black female in no acute distress Lungs: Clear to auscultation bilaterally CV: Regular rate and rhythm    Assessment  & Plan Eddie Dibbles S. Marlou Starks MD; 10/09/2019 11:51 AM) MALIGNANT NEOPLASM OF UPPER-OUTER QUADRANT OF RIGHT BREAST IN FEMALE, ESTROGEN RECEPTOR POSITIVE (C50.411) Impression: The patient is about 2 weeks status post right breast lumpectomy for breast cancer. She tolerated the surgery well. Her margins were clean but she did have 1 positive lymph node out of 7. By report her mammogram was high risk. She may require a Port-A-Cath for chemotherapy. I have discussed with her in detail the risks and benefits of the operation to place a port as well as some of the technical aspects and she understands. She will discuss this with her oncologist before making any final decisions. Otherwise I'll see her back in about 3 months to check her progress Current Plans Follow up with [place] in the office in 3 MONTHS.  Call us sooner as needed.

## 2019-10-25 NOTE — Anesthesia Preprocedure Evaluation (Addendum)
Anesthesia Evaluation  Patient identified by MRN, date of birth, ID band Patient awake    Reviewed: Allergy & Precautions, NPO status , Patient's Chart, lab work & pertinent test results  Airway Mallampati: II  TM Distance: >3 FB Neck ROM: Full    Dental no notable dental hx. (+) Teeth Intact, Dental Advisory Given   Pulmonary neg pulmonary ROS, Patient abstained from smoking., former smoker,    Pulmonary exam normal breath sounds clear to auscultation       Cardiovascular hypertension, negative cardio ROS Normal cardiovascular exam Rhythm:Regular Rate:Normal     Neuro/Psych PSYCHIATRIC DISORDERS Anxiety Depression negative neurological ROS     GI/Hepatic negative GI ROS, Neg liver ROS,   Endo/Other  Morbid obesity  Renal/GU negative Renal ROS  negative genitourinary   Musculoskeletal negative musculoskeletal ROS (+)   Abdominal   Peds  Hematology negative hematology ROS (+)   Anesthesia Other Findings   Reproductive/Obstetrics                            Anesthesia Physical Anesthesia Plan  ASA: III  Anesthesia Plan: General   Post-op Pain Management:    Induction: Intravenous  PONV Risk Score and Plan: 3 and Ondansetron, Dexamethasone and Midazolam  Airway Management Planned: LMA  Additional Equipment:   Intra-op Plan:   Post-operative Plan: Extubation in OR  Informed Consent: I have reviewed the patients History and Physical, chart, labs and discussed the procedure including the risks, benefits and alternatives for the proposed anesthesia with the patient or authorized representative who has indicated his/her understanding and acceptance.     Dental advisory given  Plan Discussed with: CRNA  Anesthesia Plan Comments:         Anesthesia Quick Evaluation

## 2019-10-25 NOTE — Anesthesia Postprocedure Evaluation (Signed)
Anesthesia Post Note  Patient: Landry Louro  Procedure(s) Performed: INSERTION PORT-A-CATH WITH POSSIBLE ULTRASOUND (N/A Neck)     Patient location during evaluation: PACU Anesthesia Type: General Level of consciousness: awake and alert Pain management: pain level controlled Vital Signs Assessment: post-procedure vital signs reviewed and stable Respiratory status: spontaneous breathing, nonlabored ventilation, respiratory function stable and patient connected to nasal cannula oxygen Cardiovascular status: blood pressure returned to baseline and stable Postop Assessment: no apparent nausea or vomiting Anesthetic complications: no    Last Vitals:  Vitals:   10/25/19 1300 10/25/19 1315  BP: (!) 142/79 140/82  Pulse: 63 60  Resp: 10 16  Temp:    SpO2: 97% 98%    Last Pain:  Vitals:   10/25/19 1315  TempSrc:   PainSc: 0-No pain                 Keeton Kassebaum L Shamarra Warda

## 2019-10-25 NOTE — Interval H&P Note (Signed)
History and Physical Interval Note:  10/25/2019 8:58 AM  Paula Davidson  has presented today for surgery, with the diagnosis of right breast cancer.  The various methods of treatment have been discussed with the patient and family. After consideration of risks, benefits and other options for treatment, the patient has consented to  Procedure(s): INSERTION PORT-A-CATH WITH POSSIBLE ULTRASOUND (N/A) as a surgical intervention.  The patient's history has been reviewed, patient examined, no change in status, stable for surgery.  I have reviewed the patient's chart and labs.  Questions were answered to the patient's satisfaction.     Autumn Messing III

## 2019-10-25 NOTE — Transfer of Care (Signed)
Immediate Anesthesia Transfer of Care Note  Patient: Artemisia Walling  Procedure(s) Performed: INSERTION PORT-A-CATH WITH POSSIBLE ULTRASOUND (N/A Neck)  Patient Location: PACU  Anesthesia Type:General  Level of Consciousness: awake, alert , oriented and patient cooperative  Airway & Oxygen Therapy: Patient Spontanous Breathing and Patient connected to face mask oxygen  Post-op Assessment: Report given to RN, Post -op Vital signs reviewed and stable and Patient moving all extremities  Post vital signs: Reviewed and stable  Last Vitals:  Vitals Value Taken Time  BP 147/81 10/25/19 1215  Temp    Pulse 64 10/25/19 1215  Resp 11 10/25/19 1215  SpO2 100 % 10/25/19 1215  Vitals shown include unvalidated device data.  Last Pain:  Vitals:   10/25/19 0936  TempSrc:   PainSc: 0-No pain         Complications: No apparent anesthesia complications

## 2019-10-25 NOTE — Anesthesia Procedure Notes (Signed)
Procedure Name: LMA Insertion Date/Time: 10/25/2019 11:10 AM Performed by: Victoriano Lain, CRNA Pre-anesthesia Checklist: Patient identified, Emergency Drugs available, Suction available, Timeout performed and Patient being monitored Patient Re-evaluated:Patient Re-evaluated prior to induction Oxygen Delivery Method: Circle system utilized Preoxygenation: Pre-oxygenation with 100% oxygen Induction Type: IV induction Ventilation: Mask ventilation without difficulty LMA: LMA with gastric port inserted LMA Size: 4.0 Number of attempts: 1 Placement Confirmation: positive ETCO2 and breath sounds checked- equal and bilateral Tube secured with: Tape Dental Injury: Teeth and Oropharynx as per pre-operative assessment

## 2019-10-25 NOTE — Op Note (Signed)
10/25/2019  12:01 PM  PATIENT:  Paula Davidson  48 y.o. female  PRE-OPERATIVE DIAGNOSIS:  right breast cancer  POST-OPERATIVE DIAGNOSIS:  right breast cancer  PROCEDURE:  Procedure(s): INSERTION PORT-A-CATH, LEFT SUBCLAVIAN  SURGEON:  Surgeon(s) and Role:    * Jovita Kussmaul, MD - Primary  PHYSICIAN ASSISTANT:   ASSISTANTS: none   ANESTHESIA:   local and general  EBL:  minimal   BLOOD ADMINISTERED:none  DRAINS: none   LOCAL MEDICATIONS USED:  MARCAINE     SPECIMEN:  No Specimen  DISPOSITION OF SPECIMEN:  N/A  COUNTS:  YES  TOURNIQUET:  * No tourniquets in log *  DICTATION: .Dragon Dictation   After informed consent was obtained the patient was brought to the operating room and placed in the supine position on the operating table.  After adequate induction of general anesthesia a roll was placed between the patient's shoulders to extend the shoulder slightly.  The left chest and neck area were then prepped with ChloraPrep, allowed to dry, and draped in usual sterile manner.  The patient was placed in Trendelenburg position.  The area lateral to the bend of the clavicle was infiltrated with quarter percent Marcaine.  A large bore needle from the Port-A-Cath kit was used to slide beneath the bend of the clavicle heading towards the sternal notch and in doing so I was able to access the left subclavian vein without difficulty.  A wire was fed through the needle using the Seldinger technique.  The wire was confirmed in the central venous system using real-time fluoroscopy.  Next a small incision was made at the wire entry site of the left chest wall.  The incision was carried through the skin and subcutaneous tissue sharply with the electrocautery.  A subcutaneous pocket was then created inferior to the incision using blunt finger dissection.  Next the tubing was placed on the reservoir.  The reservoir was placed in the pocket and the length of the tubing was estimated using real-time  fluoroscopy.  The tubing was cut to the appropriate length.  Next a sheath and dilator were fed over the wire using the Seldinger technique without difficulty.  The dilator and wire were removed from the patient.  The tubing was fed through the sheath as far as it would go and then held in place while the sheath was gently cracked and separated.  Another real-time fluoroscopy image showed the tip of the catheter to be in the distal superior vena cava.  The tubing was then permanently anchored to the reservoir.  The reservoir was anchored in the pocket with two 2-0 Prolene stitches.  The port was then aspirated and it aspirated blood easily.  The port was then flushed initially with a dilute heparin solution and then with a more concentrated heparin solution.  The subcutaneous tissue was closed over the port with interrupted 3-0 Vicryl stitches.  The skin was closed with a running 4-0 Monocryl subcuticular stitch.  Dermabond dressings were applied.  The patient tolerated the procedure well.  At the end of the case all needle sponge and instrument counts were correct.  The patient was then awakened and taken to recovery in stable condition.  PLAN OF CARE: Discharge to home after PACU  PATIENT DISPOSITION:  PACU - hemodynamically stable.   Delay start of Pharmacological VTE agent (>24hrs) due to surgical blood loss or risk of bleeding: not applicable

## 2019-10-26 ENCOUNTER — Ambulatory Visit (HOSPITAL_COMMUNITY)
Admission: RE | Admit: 2019-10-26 | Discharge: 2019-10-26 | Disposition: A | Discharge status: Home / Self Care | Payer: BLUE CROSS/BLUE SHIELD | Source: Ambulatory Visit | Attending: Hematology | Admitting: Hematology

## 2019-10-26 ENCOUNTER — Inpatient Hospital Stay: Payer: BLUE CROSS/BLUE SHIELD | Admitting: General Practice

## 2019-10-26 ENCOUNTER — Other Ambulatory Visit: Payer: Self-pay

## 2019-10-26 ENCOUNTER — Encounter (HOSPITAL_COMMUNITY)
Admission: RE | Admit: 2019-10-26 | Discharge: 2019-10-26 | Disposition: A | Discharge status: Home / Self Care | Payer: BLUE CROSS/BLUE SHIELD | Source: Ambulatory Visit | Attending: Hematology | Admitting: Hematology

## 2019-10-26 ENCOUNTER — Ambulatory Visit (HOSPITAL_BASED_OUTPATIENT_CLINIC_OR_DEPARTMENT_OTHER)
Admission: RE | Admit: 2019-10-26 | Discharge: 2019-10-26 | Disposition: A | Discharge status: Home / Self Care | Payer: BLUE CROSS/BLUE SHIELD | Source: Ambulatory Visit | Attending: Hematology | Admitting: Hematology

## 2019-10-26 ENCOUNTER — Inpatient Hospital Stay: Payer: BLUE CROSS/BLUE SHIELD | Attending: Hematology

## 2019-10-26 ENCOUNTER — Encounter: Payer: Self-pay | Admitting: General Practice

## 2019-10-26 ENCOUNTER — Encounter: Payer: Self-pay | Admitting: *Deleted

## 2019-10-26 ENCOUNTER — Encounter: Payer: Self-pay | Admitting: Hematology

## 2019-10-26 DIAGNOSIS — Z79899 Other long term (current) drug therapy: Secondary | ICD-10-CM | POA: Insufficient documentation

## 2019-10-26 DIAGNOSIS — K449 Diaphragmatic hernia without obstruction or gangrene: Secondary | ICD-10-CM | POA: Insufficient documentation

## 2019-10-26 DIAGNOSIS — Z170 Estrogen receptor positive status [ER+]: Secondary | ICD-10-CM | POA: Diagnosis not present

## 2019-10-26 DIAGNOSIS — C50411 Malignant neoplasm of upper-outer quadrant of right female breast: Secondary | ICD-10-CM | POA: Insufficient documentation

## 2019-10-26 DIAGNOSIS — I700 Atherosclerosis of aorta: Secondary | ICD-10-CM | POA: Insufficient documentation

## 2019-10-26 DIAGNOSIS — N920 Excessive and frequent menstruation with regular cycle: Secondary | ICD-10-CM | POA: Insufficient documentation

## 2019-10-26 DIAGNOSIS — Z5111 Encounter for antineoplastic chemotherapy: Secondary | ICD-10-CM | POA: Insufficient documentation

## 2019-10-26 DIAGNOSIS — I10 Essential (primary) hypertension: Secondary | ICD-10-CM | POA: Insufficient documentation

## 2019-10-26 DIAGNOSIS — F418 Other specified anxiety disorders: Secondary | ICD-10-CM | POA: Insufficient documentation

## 2019-10-26 MED ORDER — TECHNETIUM TC 99M MEDRONATE IV KIT
22.0000 | PACK | Freq: Once | INTRAVENOUS | Status: AC | PRN
  Administered 2019-10-26: 22 via INTRAVENOUS

## 2019-10-26 MED ORDER — HEPARIN SOD (PORK) LOCK FLUSH 100 UNIT/ML IV SOLN
INTRAVENOUS | Status: AC
  Filled 2019-10-26: qty 5

## 2019-10-26 MED ORDER — SODIUM CHLORIDE (PF) 0.9 % IJ SOLN
INTRAMUSCULAR | Status: AC
  Filled 2019-10-26: qty 50

## 2019-10-26 MED ORDER — IOHEXOL 300 MG/ML  SOLN
100.0000 mL | Freq: Once | INTRAMUSCULAR | Status: AC | PRN
  Administered 2019-10-26: 100 mL via INTRAVENOUS

## 2019-10-26 NOTE — Progress Notes (Signed)
[place]   Telephone:([phone]) [phone] [phone]) [phone]   Clinic Follow up Note   Patient Care Team: Burnett Sheng, MD as PCP - General (Family Medicine) Mauro Kaufmann, RN as Oncology Nurse Navigator Rockwell Germany, RN as Oncology Nurse Navigator Jovita Kussmaul, MD as Consulting Physician (General Surgery) Truitt Merle, MD as Consulting Physician (Hematology) Eppie Gibson, MD as Attending Physician (Radiation Oncology)  Date of Service:  10/29/2019  CHIEF COMPLAINT: F/u of right breast cancer  SUMMARY OF ONCOLOGIC HISTORY: Oncology History Overview Note  Cancer Staging Malignant neoplasm of upper-outer quadrant of right breast in female, estrogen receptor positive ([place]) Staging form: Breast, AJCC 8th Edition - Clinical stage from 08/28/2019: Stage IB (cT2, cN0, cM0, G2, ER+, PR+, HER2-) - Signed by Truitt Merle, MD on 09/04/2019 - Pathologic stage from 09/24/2019: Stage IB (pT2, pN1a, cM0, G2, ER+, PR+, HER2-) - Signed by Truitt Merle, MD on 10/10/2019    Malignant neoplasm of upper-outer quadrant of right breast in female, estrogen receptor positive ([place])  08/24/2019 Mammogram   Diagnostic mammogram and [place] 08/24/19 IMPRESSION: Suspicious right breast mass 10 o'clock 1 cm from the nipple measuring 3.3 x 1.9 x 1.9 cm and axillary adenopathy.   08/28/2019 Initial Biopsy   Diagnosis 08/28/19 1. Breast, right, needle core biopsy, 10 o'clock - INVASIVE MAMMARY CARCINOMA, GRADE II. - SEE MICROSCOPIC DESCRIPTION. 2. Lymph node, needle/core biopsy, right axilla - BENIGN LYMPH NODE. - NO METASTATIC CARCINOMA IDENTIFIED.    08/28/2019 Receptors her2   Results: IMMUNOHISTOCHEMICAL AND MORPHOMETRIC ANALYSIS PERFORMED MANUALLY The tumor cells are NEGATIVE for Her2 (1+). Estrogen Receptor: 100%, POSITIVE, STRONG STAINING INTENSITY Progesterone Receptor: 100%, POSITIVE, STRONG STAINING INTENSITY Proliferation Marker Ki67: 10%   08/28/2019 Cancer Staging   Staging  form: Breast, AJCC 8th Edition - Clinical stage from 08/28/2019: Stage IB (cT2, cN0, cM0, G2, ER+, PR+, HER2-) - Signed by Truitt Merle, MD on 09/04/2019   08/31/2019 Initial Diagnosis   Malignant neoplasm of upper-outer quadrant of right breast in female, estrogen receptor positive ([place])   09/07/2019 Breast MRI   Breast MRI 09/07/19  IMPRESSION: 1. 2.5 cm known malignancy in the anterior right breast, superficial depth near the nipple. 2. 7-8 mm indeterminate mass in the central upper outer quadrant of the right breast. 3. One right axillary lymph node with apparent mild cortical thickening, which lies slightly anterior and inferior to the biopsied right axillary lymph node ( benign reactive on pathology). This is most likely also benign given the pathology from the adjacent biopsied lymph node. 4. No evidence of left breast malignancy.   09/17/2019 Imaging   [place] right breast 09/17/19  IMPRESSION: Several benign appearing masses in the upper outer quadrant of the right breast without a definite correlate to the finding seen on prior MRI. The right axillary lymph nodes appear similar to the recently biopsied benign lymph node.   09/20/2019 Pathology Results   Diagnosis 09/20/19  Breast, right, needle core biopsy, upper outer quadrant - FIBROADENOMA - NO MALIGNANCY IDENTIFIED    09/21/2019 Genetic Testing   Negative genetic testing:  No pathogenic variants detected on the [facility] Breast Cancer STAT panel and Common Hereditary Cancers panel.  A variant of uncertain significance was identified in the BRCA1 gene, called c.2048A>G (T.MLY650PTW).  The report date is 09/21/2019.  The STAT Breast cancer panel offered by [facility] includes sequencing and rearrangement analysis for the following 9 genes:  ATM, BRCA1, BRCA2, CDH1, CHEK2, PALB2, PTEN, STK11 and TP53.   The Common  Hereditary Cancers Panel offered by [facility] includes sequencing and/or deletion duplication testing of the following 48  genes: APC, ATM, AXIN2, BARD1, BMPR1A, BRCA1, BRCA2, BRIP1, CDH1, CDK4, CDKN2A (p14ARF), CDKN2A (p16INK4a), CHEK2, CTNNA1, DICER1, EPCAM (Deletion/duplication testing only), GREM1 (promoter region deletion/duplication testing only), KIT, MEN1, MLH1, MSH2, MSH3, MSH6, MUTYH, NBN, NF1, NHTL1, PALB2, PDGFRA, PMS2, POLD1, POLE, PTEN, RAD50, RAD51C, RAD51D, RNF43, SDHB, SDHC, SDHD, SMAD4, SMARCA4. STK11, TP53, TSC1, TSC2, and VHL.  The following genes were evaluated for sequence changes only: SDHA and HOXB13 c.251G>A variant only.    09/24/2019 Surgery   RIGHT BREAST LUMPECTOMY WITH SENTINEL LYMPH NODE BIOPSY by Dr Toth  09/24/19    09/24/2019 Pathology Results   FINAL MICROSCOPIC DIAGNOSIS: 09/24/19   A. LYMPH NODE, RIGHT #1, SENTINEL, BIOPSY:  - There is no evidence of carcinoma in 1 of 1 lymph node (0/1).   B. LYMPH NODE, RIGHT, SENTINEL, BIOPSY:  - There is no evidence of carcinoma in 1 of 1 lymph node (0/1).   C. 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   D. LYMPH NODE, RIGHT #2, SENTINEL, BIOPSY:  - There is no evidence of carcinoma in 1 of 1 lymph node (0/1).   E. LYMPH NODE, RIGHT #3 , SENTINEL, BIOPSY:  - There is no evidence of carcinoma in 1 of 1 lymph node (0/1).   F. LYMPH NODE, RIGHT, SENTINEL, BIOPSY:  - There is no evidence of carcinoma in 1 of 1 lymph node (0/1).   G. LYMPH NODE, RIGHT, SENTINEL, BIOPSY:  -There is no evidence of carcinoma in 1 of 1 lymph node (0/1).   H. 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  - Ductal carcinoma in situ, intermediate grade.  - Perineural invasion is identified.  - The surgical resection margins are negative for carcinoma.  - See oncology table below   I. BREAST, RIGHT ADDITIONAL INFERIOR MARGIN, EXCISION:  - Fibrocystic changes.  - There is no evidence of malignancy.     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 Postoperative findings in the right breast and axilla. Upper normal sized right axillary lymph nodes, nonspecific. 2. 2 mm superior apical segment right upper lobe nodule is technically nonspecific although statistically likely to be benign. 3. 3.6 cm hypodense right adnexal structure, probably a cyst but technically nonspecific. If patient is premenopausal, no follow up is warranted; if early postmenopausal, follow up ultrasound in 6-12 months would be recommended. 4. Other imaging findings of potential clinical significance: Small type 1 hiatal hernia. Descending and sigmoid colon diverticulosis. Small indirect left inguinal hernia contains adipose tissue.   Aortic Atherosclerosis (ICD10-I70.0).   10/26/2019 Imaging   Whole Body Bone scan 10/26/19  IMPRESSION: Nonspecific sites of uptake at the RIGHT ischium and anterior   LEFT sixth rib, cannot exclude metastatic foci.   Otherwise negative exam.     10/29/2019 -  Chemotherapy   Adjuvant AC q2weeks for 4 cycles followed by weekly Taxol for 12 weeks starting 10/29/19       CURRENT THERAPY:  Adjuvant AC q2weeks for 4 cycles starting 10/29/19 followed by weekly Taxol for 12 weeks.   INTERVAL HISTORY:  Clifford Coon is here for a follow up and treatment. She presents to the clinic alone. She notes she is nervous. She has done Education  class with her roommate. She notes she has turned in her FMLA paperwork to be completed by out clinic. She will likely take time off as needed.     REVIEW OF SYSTEMS:   Constitutional: Denies fevers, chills or abnormal weight loss Eyes: Denies blurriness of vision Ears, nose, mouth, throat, and face: Denies mucositis or sore throat Respiratory: Denies cough, dyspnea or wheezes Cardiovascular: Denies palpitation, chest discomfort or lower extremity swelling Gastrointestinal:  Denies nausea, heartburn or change in bowel habits Skin: Denies abnormal skin rashes Lymphatics: Denies new lymphadenopathy or easy bruising Neurological:Denies numbness, tingling or new weaknesses Behavioral/Psych: Mood is stable, no new changes  All other systems were reviewed with the patient and are negative.  MEDICAL HISTORY:  Past Medical History:  Diagnosis Date  . Anxiety   . Cancer (HCC) 09/24/2019   right breast cancer-had lumpectomy  . Depression   . DUB (dysfunctional uterine bleeding)   . Family history of pancreatic cancer   . Hypertension     SURGICAL HISTORY: Past Surgical History:  Procedure Laterality Date  . BREAST LUMPECTOMY WITH AXILLARY LYMPH NODE BIOPSY Right 09/24/2019   Procedure: RIGHT BREAST LUMPECTOMY WITH SENTINEL LYMPH NODE BIOPSY;  Surgeon: Toth, Paul III, MD;  Location:  SURGERY CENTER;  Service: General;  Laterality: Right;  . BREAST SURGERY  09/24/2019   Breast lump excised  . FOOT SURGERY    . LAPAROSCOPIC GASTRIC BANDING    . PORTACATH PLACEMENT N/A 10/25/2019   Procedure: INSERTION PORT-A-CATH WITH POSSIBLE ULTRASOUND;  Surgeon: Toth, Paul III, MD;  Location: [facility];  Service: General;  Laterality: N/A;  . WISDOM TOOTH EXTRACTION      I have reviewed the social history and family history with the patient and they are unchanged from previous note.  ALLERGIES:  is allergic to hydrochlorothiazide; codeine; and lisinopril.  MEDICATIONS:  Current Outpatient  Medications  Medication Sig Dispense Refill  . amitriptyline (ELAVIL) 25 MG tablet Take 25 mg by mouth at bedtime as needed for sleep.    . amLODipine (NORVASC) 5 MG tablet Take 5 mg by mouth daily.    . Bioflavonoid Products (ESTER C PO) Take 1 tablet by mouth daily.    . Calcium Carbonate-Vitamin D (CALCIUM + D PO) Take 1 tablet by mouth daily.     . cetirizine (ZYRTEC) 10 MG tablet Take 10 mg by mouth daily as needed for allergies.    . cholecalciferol (VITAMIN D) 25 MCG (1000 UT) tablet Take 1,000 Units by mouth daily.    . citalopram (CELEXA) 20 MG tablet Take 20 mg by mouth daily.    . HYDROcodone-acetaminophen (NORCO/VICODIN) 5-325 MG tablet Take 1-2 tablets by mouth every 6 (six) hours as needed for moderate pain or severe pain. 10 tablet 0  . lidocaine-prilocaine (EMLA) cream Apply to affected area once 30 g 3  . losartan (COZAAR) 100 MG tablet Take 100 mg by mouth daily.    . Multiple Vitamin (MULTIVITAMIN WITH   MINERALS) TABS tablet Take 1 tablet by mouth daily.    . ondansetron (ZOFRAN) 8 MG tablet Take 1 tablet (8 mg total) by mouth 2 (two) times daily as needed. Start on the third day after chemotherapy. 30 tablet 1  . prochlorperazine (COMPAZINE) 10 MG tablet Take 1 tablet (10 mg total) by mouth every 6 (six) hours as needed (Nausea or vomiting). 30 tablet 1   No current facility-administered medications for this visit.    PHYSICAL EXAMINATION: ECOG PERFORMANCE STATUS: 0 - Asymptomatic  Vitals with BMI 10/25/2019  Height 5'6.5"  Weight   BMI   Systolic 502  Diastolic 82  Pulse 60    GENERAL:alert, no distress and comfortable SKIN: skin color, texture, turgor are normal, no rashes or significant lesions EYES: normal, Conjunctiva are pink and non-injected, sclera clear  NECK: supple, thyroid normal size, non-tender, without nodularity LYMPH:  no palpable lymphadenopathy in the cervical, axillary  LUNGS: clear to auscultation and percussion with normal breathing  effort HEART: regular rate & rhythm and no murmurs and no lower extremity edema ABDOMEN:abdomen soft, non-tender and normal bowel sounds Musculoskeletal:no cyanosis of digits and no clubbing  NEURO: alert & oriented x 3 with fluent speech, no focal motor/sensory deficits  LABORATORY DATA:  I have reviewed the data as listed CBC Latest Ref Rng & Units 10/29/2019 10/23/2019 09/05/2019  WBC 4.0 - 10.5 K/uL 8.1 6.9 6.2  Hemoglobin 12.0 - 15.0 g/dL 11.3(L) 11.8(L) 12.1  Hematocrit 36.0 - 46.0 % 34.8(L) 36.7 37.4  Platelets 150 - 400 K/uL 374 423(H) 442(H)     CMP Latest Ref Rng & Units 10/29/2019 10/23/2019 09/05/2019  Glucose 70 - 99 mg/dL 92 101(H) 115(H)  BUN 6 - 20 mg/dL _0 Creatinine 0.44 - 1.00 mg/dL 0.65 0.62 0.76  Sodium 135 - 145 mmol/L 137 138 142  Potassium 3.5 - 5.1 mmol/L 3.8 3.8 3.7  Chloride 98 - 111 mmol/L 105 103 106  CO2 22 - 32 mmol/L _1 Calcium 8.9 - 10.3 mg/dL 8.8(L) 9.7 9.6  Total Protein 6.5 - 8.1 g/dL 7.3 - 8.0  Total Bilirubin 0.3 - 1.2 mg/dL 0.3 - 0.4  Alkaline Phos 38 - 126 U/L 64 - 63  AST 15 - 41 U/L 14(L) - 14(L)  ALT 0 - 44 U/L 13 - 17      RADIOGRAPHIC STUDIES: I have personally reviewed the radiological images as listed and agreed with the findings in the report. No results found.   ASSESSMENT & PLAN:  Paula Davidson is a 48 y.o. female with   1 Malignant neoplasm of upper-outer quadrant of right breast, Stage IB, c(T2N0M0), ER/PR+, HER2-, Grade II -She was diagnosed in 08/2019. She has had a right breast mass for 7-8 years and recently grew. Her [place] and Biopsy show grade II invasive mammary carcinoma of right breast and lymph node negative. MRI showed tumor 2.5cm, 1 positive axillary LN and a 7-73m indeterminate mass that biopsies and found to be Fibroadenoma.  -She underwent right lumpectomy and SLNB with Dr. TMarlou Starkson 09/24/19. Her path shows complete surgical resection of tumor with 1/7 positive LN, negative margins. Her mammaprint showed  High Risk Luminal Type B with 29% risk of recurrence in 10 years.  -I personally reviewed and discussed her staging CT CAP and bone scan from 10/26/19 which show no bone or distant metastasis. I reviewed incidental findings with patient of right ovary cyst and tiny 256mnodule in the lung, which are likely  benign  -To reduce her high risk adjuvant chemo is recommended with IV chemo AC q2weeks for 4 cycles followed by weekly Taxol for 12 weeks. Plan to start today.  -Labs reviewed and adequate to proceed with first cycle AC today with GCSF on day 3. I reviewed side effects with her along with antiemetics and Claritin use.  -To reduce her risk of local recurrence, adjuvant Radiation is recommended followed by antiestrogen therapy given her ER/PR positive disease to reduce her risk of distant recurrence.  -f/u in 2 weeks. She prefers thursdays/fridays for treatments. -her mother came in with her today, and I reviewed the above with her with pt's permission.    2. 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 Menorrhagia  -She has severe menorrhagia with long term bleeding. She previously required IV iron  -Her Gyn started Mirena 5 years ago which stopped her periods.  -She had Mirena removed in 08/2019. Her period has not returned yet.  -I discussed given her age she may start menopause soon. Will test her hormonal level in the near future if she has no menstrual period does not return.  -She has h/o of ovarian cyst. She currently has one of right ovary as seen on 10/26/19 CT scan. Will f/u with Gyn.   4. HTN, Depression, Anxiety, obesity  -On Amlodipine, Losartan, Lopressor and Celexa -She notes she lives with roommate in [place].    PLAN:  -Labs reviewed and adequate to proceed with C1 AC today  -Lab, flush, f/u and AC in 2, 4, 6 weeks     No problem-specific Assessment & Plan notes found for this encounter.   No orders of  the defined types were placed in this encounter.  All questions were answered. The patient knows to call the clinic with any problems, questions or concerns. No barriers to learning was detected. I spent 25 minutes counseling the patient face to face. The total time spent in the appointment was 40 minutes including counseling her mother and more than 50% was on counseling and review of test results     Truitt Merle, MD 10/29/2019   I, Joslyn Devon, am acting as scribe for Truitt Merle, MD.   I have reviewed the above documentation for accuracy and completeness, and I agree with the above.

## 2019-10-26 NOTE — Progress Notes (Signed)
  Echocardiogram 2D Echocardiogram has been performed.  Paula Davidson 10/26/2019, 11:43 AM

## 2019-10-26 NOTE — Progress Notes (Signed)
Called ptto introduce myself as herFinancial Resource Specialist andtodiscuss copay assistance. Pt gave me consent to apply in herbehalf so I completed theonlineapplicationwith Advertising copywriter for [employer]. Pt's app is pending so I will notify herof the outcome once I receive it. Pt is overqualified for the [employer]. Iwill giveher my card for any questions or concernsshe may have in the future at her next visit.

## 2019-10-26 NOTE — Progress Notes (Signed)
[place] Initial Psychosocial Assessment Clinical Social Work  Clinical Social Work contacted by phone to assess psychosocial, emotional, mental health, and spiritual needs of the patient.   Barriers to care/review of distress screen:  - Transportation:  Do you anticipate any problems getting to appointments?  Do you have someone who can help run errands for you if you need it?  Has multiple friends/family members who can assist if she is unable to drive.  At this time, she has no issues w driving herself to appointments.  - Help at home:  What is your living situation (alone, family, other)?  If you are physically unable to care for yourself, who would you call on to help you?  Moved in w supportive roommate approx one year ago.  Roommate will attend chemo ed class w patient.  - Support system:  What does your support system look like?  Who would you call on if you needed some kind of practical help?  What if you needed someone to talk to for emotional support?  Significant support from family and friends.  All are mobilizing to help her w new diagnosis.  Negotiating role changes - patient is used to be the one helping others out.  Is now recipient of help more than she is used to.  - Finances:  Are you concerned about finances .  Considering returning to work?  If not, applying for disability?  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  Concerned about mounting medical bills - copays and deductibles.    What is your understanding of where you are with your cancer? Its cause?  Your treatment plan and what happens next?  New diagnosis of cancer - unexpected.  Keeps getting more information and finding out that her treatment has progressed from "nothing more needed" to chemotherapy followed by radiation.  Many tests, scans and appointments.  Sometimes does not like to answer phone, is afraid it may be more "bad news."  First in her family to be diagnosed with cancer.  Will  begin chemotherapy on Monday, understandably anxious about how it will go.    What are your worries for the future as you begin treatment for cancer?  Side effects and physical changes that may come with chemotherapy.  General anxiety about implications of being diagnosed and treated for cancer.    What are your hopes and priorities during your treatment? What is important to you? What are your goals for your care?Wants to continue to work.   CSW Summary:  Patient and family psychosocial functioning including strengths, limitations, and coping skills:  New and unexpected diagnosis of breast cancer.  Initially was told she did not need chemotherapy - this has now changed to treatment plan that includes chemo and radiation.  Just beginning treatment next week.  Has been in process of multiple scans and tests, many phone calls/appointments.  Great support from family, friends, roommate, significant other.  Works at job she enjoys and has much support from work.  Dealing with some anxiety re the process of being treated for cancer, does not know what to expect.  Functions best when she can have information/knowledge.    Identifications of barriers to care:  Costs of copays, deductibles.  Lives at at distance from [facility].  Would like appointments grouped whenever possible   Availability of community resources:  Refer to [employer] for mentor match.  Can access counseling intern at [facility] if desired.    Clinical Social  Worker follow up needed: No.  She has my contact information and is encouraged to participate in [employer] as desired.     Edwyna Shell, LCSW Clinical Social Worker Phone:  [phone] Cell:  [phone]

## 2019-10-29 ENCOUNTER — Encounter: Payer: Self-pay | Admitting: *Deleted

## 2019-10-29 ENCOUNTER — Inpatient Hospital Stay: Payer: BLUE CROSS/BLUE SHIELD

## 2019-10-29 ENCOUNTER — Encounter: Payer: Self-pay | Admitting: Hematology

## 2019-10-29 ENCOUNTER — Other Ambulatory Visit: Payer: Self-pay

## 2019-10-29 ENCOUNTER — Inpatient Hospital Stay (HOSPITAL_BASED_OUTPATIENT_CLINIC_OR_DEPARTMENT_OTHER): Payer: BLUE CROSS/BLUE SHIELD | Admitting: Hematology

## 2019-10-29 VITALS — BP 130/82 | HR 65 | Temp 97.8°F | Resp 18

## 2019-10-29 DIAGNOSIS — Z95828 Presence of other vascular implants and grafts: Secondary | ICD-10-CM

## 2019-10-29 DIAGNOSIS — Z170 Estrogen receptor positive status [ER+]: Secondary | ICD-10-CM

## 2019-10-29 DIAGNOSIS — I700 Atherosclerosis of aorta: Secondary | ICD-10-CM | POA: Diagnosis not present

## 2019-10-29 DIAGNOSIS — K449 Diaphragmatic hernia without obstruction or gangrene: Secondary | ICD-10-CM | POA: Diagnosis not present

## 2019-10-29 DIAGNOSIS — I10 Essential (primary) hypertension: Secondary | ICD-10-CM

## 2019-10-29 DIAGNOSIS — N920 Excessive and frequent menstruation with regular cycle: Secondary | ICD-10-CM | POA: Diagnosis not present

## 2019-10-29 DIAGNOSIS — C50411 Malignant neoplasm of upper-outer quadrant of right female breast: Secondary | ICD-10-CM

## 2019-10-29 DIAGNOSIS — Z79899 Other long term (current) drug therapy: Secondary | ICD-10-CM | POA: Diagnosis not present

## 2019-10-29 DIAGNOSIS — Z5111 Encounter for antineoplastic chemotherapy: Secondary | ICD-10-CM | POA: Diagnosis not present

## 2019-10-29 DIAGNOSIS — F418 Other specified anxiety disorders: Secondary | ICD-10-CM | POA: Diagnosis not present

## 2019-10-29 LAB — CBC WITH DIFFERENTIAL (CANCER CENTER ONLY)
Abs Immature Granulocytes: 0.02 10*3/uL (ref 0.00–0.07)
Basophils Absolute: 0 10*3/uL (ref 0.0–0.1)
Basophils Relative: 0 %
Eosinophils Absolute: 1 10*3/uL — ABNORMAL HIGH (ref 0.0–0.5)
Eosinophils Relative: 13 %
HCT: 34.8 % — ABNORMAL LOW (ref 36.0–46.0)
Hemoglobin: 11.3 g/dL — ABNORMAL LOW (ref 12.0–15.0)
Immature Granulocytes: 0 %
Lymphocytes Relative: 30 %
Lymphs Abs: 2.4 10*3/uL (ref 0.7–4.0)
MCH: 26.1 pg (ref 26.0–34.0)
MCHC: 32.5 g/dL (ref 30.0–36.0)
MCV: 80.4 fL (ref 80.0–100.0)
Monocytes Absolute: 0.7 10*3/uL (ref 0.1–1.0)
Monocytes Relative: 8 %
Neutro Abs: 4 10*3/uL (ref 1.7–7.7)
Neutrophils Relative %: 49 %
Platelet Count: 374 10*3/uL (ref 150–400)
RBC: 4.33 MIL/uL (ref 3.87–5.11)
RDW: 15.5 % (ref 11.5–15.5)
WBC Count: 8.1 10*3/uL (ref 4.0–10.5)
nRBC: 0 % (ref 0.0–0.2)

## 2019-10-29 LAB — CMP (CANCER CENTER ONLY)
ALT: 13 U/L (ref 0–44)
AST: 14 U/L — ABNORMAL LOW (ref 15–41)
Albumin: 3.6 g/dL (ref 3.5–5.0)
Alkaline Phosphatase: 64 U/L (ref 38–126)
Anion gap: 8 (ref 5–15)
BUN: 7 mg/dL (ref 6–20)
CO2: 24 mmol/L (ref 22–32)
Calcium: 8.8 mg/dL — ABNORMAL LOW (ref 8.9–10.3)
Chloride: 105 mmol/L (ref 98–111)
Creatinine: 0.65 mg/dL (ref 0.44–1.00)
GFR, Est AFR Am: >60 mL/min (ref >60)
GFR, Estimated: >60 mL/min (ref >60)
Glucose, Bld: 92 mg/dL (ref 70–99)
Potassium: 3.8 mmol/L (ref 3.5–5.1)
Sodium: 137 mmol/L (ref 135–145)
Total Bilirubin: 0.3 mg/dL (ref 0.3–1.2)
Total Protein: 7.3 g/dL (ref 6.5–8.1)

## 2019-10-29 MED ORDER — HEPARIN SOD (PORK) LOCK FLUSH 100 UNIT/ML IV SOLN
500.0000 [IU] | Freq: Once | INTRAVENOUS | Status: AC | PRN
  Administered 2019-10-29: 500 [IU]
  Filled 2019-10-29: qty 5

## 2019-10-29 MED ORDER — DOXORUBICIN HCL CHEMO IV INJECTION 2 MG/ML
60.0000 mg/m2 | Freq: Once | INTRAVENOUS | Status: AC
  Administered 2019-10-29: 150 mg via INTRAVENOUS
  Filled 2019-10-29: qty 75

## 2019-10-29 MED ORDER — PALONOSETRON HCL INJECTION 0.25 MG/5ML
INTRAVENOUS | Status: AC
  Filled 2019-10-29: qty 5

## 2019-10-29 MED ORDER — SODIUM CHLORIDE 0.9% FLUSH
10.0000 mL | INTRAVENOUS | Status: DC | PRN
  Administered 2019-10-29: 10 mL via INTRAVENOUS
  Filled 2019-10-29: qty 10

## 2019-10-29 MED ORDER — SODIUM CHLORIDE 0.9% FLUSH
10.0000 mL | INTRAVENOUS | Status: DC | PRN
  Administered 2019-10-29: 10 mL
  Filled 2019-10-29: qty 10

## 2019-10-29 MED ORDER — SODIUM CHLORIDE 0.9 % IV SOLN
600.0000 mg/m2 | Freq: Once | INTRAVENOUS | Status: AC
  Administered 2019-10-29: 1500 mg via INTRAVENOUS
  Filled 2019-10-29: qty 75

## 2019-10-29 MED ORDER — PALONOSETRON HCL INJECTION 0.25 MG/5ML
0.2500 mg | Freq: Once | INTRAVENOUS | Status: AC
  Administered 2019-10-29: 0.25 mg via INTRAVENOUS

## 2019-10-29 MED ORDER — SODIUM CHLORIDE 0.9 % IV SOLN
Freq: Once | INTRAVENOUS | Status: AC
  Administered 2019-10-29: 15:00:00 via INTRAVENOUS
  Filled 2019-10-29: qty 5

## 2019-10-29 MED ORDER — SODIUM CHLORIDE 0.9 % IV SOLN
Freq: Once | INTRAVENOUS | Status: AC
  Administered 2019-10-29: 15:00:00 via INTRAVENOUS
  Filled 2019-10-29: qty 250

## 2019-10-29 NOTE — Patient Instructions (Signed)
[facility] Discharge Instructions for Patients Receiving Chemotherapy  Today you received the following chemotherapy agents Doxorubicin; Cytoxin  To help prevent nausea and vomiting after your treatment, we encourage you to take your nausea medication as directed   If you develop nausea and vomiting that is not controlled by your nausea medication, call the clinic.   BELOW ARE SYMPTOMS THAT SHOULD BE REPORTED IMMEDIATELY:  *FEVER GREATER THAN 100.5 F  *CHILLS WITH OR WITHOUT FEVER  NAUSEA AND VOMITING THAT IS NOT CONTROLLED WITH YOUR NAUSEA MEDICATION  *UNUSUAL SHORTNESS OF BREATH  *UNUSUAL BRUISING OR BLEEDING  TENDERNESS IN MOUTH AND THROAT WITH OR WITHOUT PRESENCE OF ULCERS  *URINARY PROBLEMS  *BOWEL PROBLEMS  UNUSUAL RASH Items with * indicate a potential emergency and should be followed up as soon as possible.  Feel free to call the clinic should you have any questions or concerns. The clinic phone number is [phone].  Please show the [facility] at check-in to the Emergency Department and triage nurse.  Doxorubicin injection What is this medicine? DOXORUBICIN (dox oh ROO bi sin) is a chemotherapy drug. It is used to treat many kinds of cancer like leukemia, lymphoma, neuroblastoma, sarcoma, and Wilms' tumor. It is also used to treat bladder cancer, breast cancer, lung cancer, ovarian cancer, stomach cancer, and thyroid cancer. This medicine may be used for other purposes; ask your health care provider or pharmacist if you have questions. COMMON BRAND NAME(S): Adriamycin, Adriamycin PFS, Adriamycin RDF, Rubex What should I tell my health care provider before I take this medicine? They need to know if you have any of these conditions:  heart disease  history of low blood counts caused by a medicine  liver disease  recent or ongoing radiation therapy  an unusual or allergic reaction to doxorubicin, other chemotherapy agents, other  medicines, foods, dyes, or preservatives  pregnant or trying to get pregnant  breast-feeding How should I use this medicine? This drug is given as an infusion into a vein. It is administered in a hospital or clinic by a specially trained health care professional. If you have pain, swelling, burning or any unusual feeling around the site of your injection, tell your health care professional right away. Talk to your pediatrician regarding the use of this medicine in children. Special care may be needed. Overdosage: If you think you have taken too much of this medicine contact a poison control center or emergency room at once. NOTE: This medicine is only for you. Do not share this medicine with others. What if I miss a dose? It is important not to miss your dose. Call your doctor or health care professional if you are unable to keep an appointment. What may interact with this medicine? This medicine may interact with the following medications:  6-mercaptopurine  paclitaxel  phenytoin  St. John's Wort  trastuzumab  verapamil This list may not describe all possible interactions. Give your health care provider a list of all the medicines, herbs, non-prescription drugs, or dietary supplements you use. Also tell them if you smoke, drink alcohol, or use illegal drugs. Some items may interact with your medicine. What should I watch for while using this medicine? This drug may make you feel generally unwell. This is not uncommon, as chemotherapy can affect healthy cells as well as cancer cells. Report any side effects. Continue your course of treatment even though you feel ill unless your doctor tells you to stop. There is a maximum amount of  this medicine you should receive throughout your life. The amount depends on the medical condition being treated and your overall health. Your doctor will watch how much of this medicine you receive in your lifetime. Tell your doctor if you have taken this  medicine before. You may need blood work done while you are taking this medicine. Your urine may turn red for a few days after your dose. This is not blood. If your urine is dark or brown, call your doctor. In some cases, you may be given additional medicines to help with side effects. Follow all directions for their use. Call your doctor or health care professional for advice if you get a fever, chills or sore throat, or other symptoms of a cold or flu. Do not treat yourself. This drug decreases your body's ability to fight infections. Try to avoid being around people who are sick. This medicine may increase your risk to bruise or bleed. Call your doctor or health care professional if you notice any unusual bleeding. Talk to your doctor about your risk of cancer. You may be more at risk for certain types of cancers if you take this medicine. Do not become pregnant while taking this medicine or for 6 months after stopping it. Women should inform their doctor if they wish to become pregnant or think they might be pregnant. Men should not father a child while taking this medicine and for 6 months after stopping it. There is a potential for serious side effects to an unborn child. Talk to your health care professional or pharmacist for more information. Do not breast-feed an infant while taking this medicine. This medicine has caused ovarian failure in some women and reduced sperm counts in some men This medicine may interfere with the ability to have a child. Talk with your doctor or health care professional if you are concerned about your fertility. This medicine may cause a decrease in Co-Enzyme Q-10. You should make sure that you get enough Co-Enzyme Q-10 while you are taking this medicine. Discuss the foods you eat and the vitamins you take with your health care professional. What side effects may I notice from receiving this medicine? Side effects that you should report to your doctor or health care  professional as soon as possible:  allergic reactions like skin rash, itching or hives, swelling of the face, lips, or tongue  breathing problems  chest pain  fast or irregular heartbeat  low blood counts - this medicine may decrease the number of white blood cells, red blood cells and platelets. You may be at increased risk for infections and bleeding.  pain, redness, or irritation at site where injected  signs of infection - fever or chills, cough, sore throat, pain or difficulty passing urine  signs of decreased platelets or bleeding - bruising, pinpoint red spots on the skin, black, tarry stools, blood in the urine  swelling of the ankles, feet, hands  tiredness  weakness Side effects that usually do not require medical attention (report to your doctor or health care professional if they continue or are bothersome):  diarrhea  hair loss  mouth sores  nail discoloration or damage  nausea  red colored urine  vomiting This list may not describe all possible side effects. Call your doctor for medical advice about side effects. You may report side effects to FDA at 1-800-FDA-1088. Where should I keep my medicine? This drug is given in a hospital or clinic and will not be stored at home. NOTE:  This sheet is a summary. It may not cover all possible information. If you have questions about this medicine, talk to your doctor, pharmacist, or health care provider.  2020 Elsevier/Gold Standard (2017-06-15 11:01:26)  Cyclophosphamide injection What is this medicine? CYCLOPHOSPHAMIDE (sye kloe FOSS fa mide) is a chemotherapy drug. It slows the growth of cancer cells. This medicine is used to treat many types of cancer like lymphoma, myeloma, leukemia, breast cancer, and ovarian cancer, to name a few. This medicine may be used for other purposes; ask your health care provider or pharmacist if you have questions. COMMON BRAND NAME(S): Cytoxan, Neosar What should I tell my health  care provider before I take this medicine? They need to know if you have any of these conditions:  blood disorders  history of other chemotherapy  infection  kidney disease  liver disease  recent or ongoing radiation therapy  tumors in the bone marrow  an unusual or allergic reaction to cyclophosphamide, other chemotherapy, other medicines, foods, dyes, or preservatives  pregnant or trying to get pregnant  breast-feeding How should I use this medicine? This drug is usually given as an injection into a vein or muscle or by infusion into a vein. It is administered in a hospital or clinic by a specially trained health care professional. Talk to your pediatrician regarding the use of this medicine in children. Special care may be needed. Overdosage: If you think you have taken too much of this medicine contact a poison control center or emergency room at once. NOTE: This medicine is only for you. Do not share this medicine with others. What if I miss a dose? It is important not to miss your dose. Call your doctor or health care professional if you are unable to keep an appointment. What may interact with this medicine? This medicine may interact with the following medications:  amiodarone  amphotericin B  azathioprine  certain antiviral medicines for HIV or AIDS such as protease inhibitors (e.g., indinavir, ritonavir) and zidovudine  certain blood pressure medications such as benazepril, captopril, enalapril, fosinopril, lisinopril, moexipril, monopril, perindopril, quinapril, ramipril, trandolapril  certain cancer medications such as anthracyclines (e.g., daunorubicin, doxorubicin), busulfan, cytarabine, paclitaxel, pentostatin, tamoxifen, trastuzumab  certain diuretics such as chlorothiazide, chlorthalidone, hydrochlorothiazide, indapamide, metolazone  certain medicines that treat or prevent blood clots like warfarin  certain muscle relaxants such as  succinylcholine  cyclosporine  etanercept  indomethacin  medicines to increase blood counts like filgrastim, pegfilgrastim, sargramostim  medicines used as general anesthesia  metronidazole  natalizumab This list may not describe all possible interactions. Give your health care provider a list of all the medicines, herbs, non-prescription drugs, or dietary supplements you use. Also tell them if you smoke, drink alcohol, or use illegal drugs. Some items may interact with your medicine. What should I watch for while using this medicine? Visit your doctor for checks on your progress. This drug may make you feel generally unwell. This is not uncommon, as chemotherapy can affect healthy cells as well as cancer cells. Report any side effects. Continue your course of treatment even though you feel ill unless your doctor tells you to stop. Drink water or other fluids as directed. Urinate often, even at night. In some cases, you may be given additional medicines to help with side effects. Follow all directions for their use. Call your doctor or health care professional for advice if you get a fever, chills or sore throat, or other symptoms of a cold or flu. Do not treat  yourself. This drug decreases your body's ability to fight infections. Try to avoid being around people who are sick. This medicine may increase your risk to bruise or bleed. Call your doctor or health care professional if you notice any unusual bleeding. Be careful brushing and flossing your teeth or using a toothpick because you may get an infection or bleed more easily. If you have any dental work done, tell your dentist you are receiving this medicine. You may get drowsy or dizzy. Do not drive, use machinery, or do anything that needs mental alertness until you know how this medicine affects you. Do not become pregnant while taking this medicine or for 1 year after stopping it. Women should inform their doctor if they wish to  become pregnant or think they might be pregnant. Men should not father a child while taking this medicine and for 4 months after stopping it. There is a potential for serious side effects to an unborn child. Talk to your health care professional or pharmacist for more information. Do not breast-feed an infant while taking this medicine. This medicine may interfere with the ability to have a child. This medicine has caused ovarian failure in some women. This medicine has caused reduced sperm counts in some men. You should talk with your doctor or health care professional if you are concerned about your fertility. If you are going to have surgery, tell your doctor or health care professional that you have taken this medicine. What side effects may I notice from receiving this medicine? Side effects that you should report to your doctor or health care professional as soon as possible:  allergic reactions like skin rash, itching or hives, swelling of the face, lips, or tongue  low blood counts - this medicine may decrease the number of white blood cells, red blood cells and platelets. You may be at increased risk for infections and bleeding.  signs of infection - fever or chills, cough, sore throat, pain or difficulty passing urine  signs of decreased platelets or bleeding - bruising, pinpoint red spots on the skin, black, tarry stools, blood in the urine  signs of decreased red blood cells - unusually weak or tired, fainting spells, lightheadedness  breathing problems  dark urine  dizziness  palpitations  swelling of the ankles, feet, hands  trouble passing urine or change in the amount of urine  weight gain  yellowing of the eyes or skin Side effects that usually do not require medical attention (report to your doctor or health care professional if they continue or are bothersome):  changes in nail or skin color  hair loss  missed menstrual periods  mouth sores  nausea,  vomiting This list may not describe all possible side effects. Call your doctor for medical advice about side effects. You may report side effects to FDA at 1-800-FDA-1088. Where should I keep my medicine? This drug is given in a hospital or clinic and will not be stored at home. NOTE: This sheet is a summary. It may not cover all possible information. If you have questions about this medicine, talk to your doctor, pharmacist, or health care provider.  2020 Elsevier/Gold Standard (2012-09-15 16:22:58)

## 2019-10-30 ENCOUNTER — Encounter: Payer: Self-pay | Admitting: Hematology

## 2019-10-30 ENCOUNTER — Telehealth: Payer: Self-pay | Admitting: Hematology

## 2019-10-30 ENCOUNTER — Telehealth: Payer: Self-pay | Admitting: *Deleted

## 2019-10-30 NOTE — Telephone Encounter (Signed)
Scheduled appt per 12/14 los.  Spoke with pt and she is aware of her appt dates and time.

## 2019-10-30 NOTE — Progress Notes (Signed)
Pt was approved w/ [employer] for Ful[facility]ila from12/15/20 to12/14/21 for up to $10,000.The program reduces pt's copay responsibility to $0.

## 2019-10-30 NOTE — Telephone Encounter (Signed)
Called pt to see how she did with her 1st treatment yest.  She reports doing well & no symptoms to report.  She said it wasn't as bad as she thought. She reports knowing when to call & how to reach [place].  Encouraged to call with problems/concerns.

## 2019-10-30 NOTE — Telephone Encounter (Signed)
-----   Message from Priscille Loveless, RN sent at 10/29/2019  1:57 PM EST ----- Regarding: First Chemo Follow up [facility] and doxorubicin followup

## 2019-10-31 ENCOUNTER — Other Ambulatory Visit: Payer: Self-pay

## 2019-10-31 ENCOUNTER — Inpatient Hospital Stay: Payer: BLUE CROSS/BLUE SHIELD

## 2019-10-31 VITALS — BP 154/88 | HR 64 | Temp 97.9°F | Resp 20

## 2019-10-31 DIAGNOSIS — C50411 Malignant neoplasm of upper-outer quadrant of right female breast: Secondary | ICD-10-CM | POA: Diagnosis not present

## 2019-10-31 MED ORDER — PEGFILGRASTIM-JMDB 6 MG/0.6ML ~~LOC~~ SOSY
6.0000 mg | PREFILLED_SYRINGE | Freq: Once | SUBCUTANEOUS | Status: AC
  Administered 2019-10-31: 6 mg via SUBCUTANEOUS

## 2019-10-31 MED ORDER — PEGFILGRASTIM-JMDB 6 MG/0.6ML ~~LOC~~ SOSY
PREFILLED_SYRINGE | SUBCUTANEOUS | Status: AC
  Filled 2019-10-31: qty 0.6

## 2019-10-31 NOTE — Patient Instructions (Signed)

## 2019-11-02 NOTE — Progress Notes (Signed)
[facility]   [phone] [phone]   Clinic Follow up Note   Patient Care Team: Burnett Sheng, MD as PCP - General (Family Medicine) Mauro Kaufmann, RN as Oncology Nurse Navigator Rockwell Germany, RN as Oncology Nurse Navigator Jovita Kussmaul, MD as Consulting Physician (General Surgery) Truitt Merle, MD as Consulting Physician (Hematology) Eppie Gibson, MD as Attending Physician (Radiation Oncology)  Date of Service:  11/12/2019  CHIEF COMPLAINT:  F/u of right breast cancer  SUMMARY OF ONCOLOGIC HISTORY: Oncology History Overview Note  Cancer Staging Malignant neoplasm of upper-outer quadrant of right breast in female, estrogen receptor positive ([place]) Staging form: Breast, AJCC 8th Edition - Clinical stage from 08/28/2019: Stage IB (cT2, cN0, cM0, G2, ER+, PR+, HER2-) - Signed by Truitt Merle, MD on 09/04/2019 - Pathologic stage from 09/24/2019: Stage IB (pT2, pN1a, cM0, G2, ER+, PR+, HER2-) - Signed by Truitt Merle, MD on 10/10/2019    Malignant neoplasm of upper-outer quadrant of right breast in female, estrogen receptor positive ([place])  08/24/2019 Mammogram   Diagnostic mammogram and [place] 08/24/19 IMPRESSION: Suspicious right breast mass 10 o'clock 1 cm from the nipple measuring 3.3 x 1.9 x 1.9 cm and axillary adenopathy.   08/28/2019 Initial Biopsy   Diagnosis 08/28/19 1. Breast, right, needle core biopsy, 10 o'clock - INVASIVE MAMMARY CARCINOMA, GRADE II. - SEE MICROSCOPIC DESCRIPTION. 2. Lymph node, needle/core biopsy, right axilla - BENIGN LYMPH NODE. - NO METASTATIC CARCINOMA IDENTIFIED.    08/28/2019 Receptors her2   Results: IMMUNOHISTOCHEMICAL AND MORPHOMETRIC ANALYSIS PERFORMED MANUALLY The tumor cells are NEGATIVE for Her2 (1+). Estrogen Receptor: 100%, POSITIVE, STRONG STAINING INTENSITY Progesterone Receptor: 100%, POSITIVE, STRONG STAINING INTENSITY Proliferation Marker Ki67: 10%   08/28/2019 Cancer Staging   Staging  form: Breast, AJCC 8th Edition - Clinical stage from 08/28/2019: Stage IB (cT2, cN0, cM0, G2, ER+, PR+, HER2-) - Signed by Truitt Merle, MD on 09/04/2019   08/31/2019 Initial Diagnosis   Malignant neoplasm of upper-outer quadrant of right breast in female, estrogen receptor positive ([place])   09/07/2019 Breast MRI   Breast MRI 09/07/19  IMPRESSION: 1. 2.5 cm known malignancy in the anterior right breast, superficial depth near the nipple. 2. 7-8 mm indeterminate mass in the central upper outer quadrant of the right breast. 3. One right axillary lymph node with apparent mild cortical thickening, which lies slightly anterior and inferior to the biopsied right axillary lymph node ( benign reactive on pathology). This is most likely also benign given the pathology from the adjacent biopsied lymph node. 4. No evidence of left breast malignancy.   09/17/2019 Imaging   [place] right breast 09/17/19  IMPRESSION: Several benign appearing masses in the upper outer quadrant of the right breast without a definite correlate to the finding seen on prior MRI. The right axillary lymph nodes appear similar to the recently biopsied benign lymph node.   09/20/2019 Pathology Results   Diagnosis 09/20/19  Breast, right, needle core biopsy, upper outer quadrant - FIBROADENOMA - NO MALIGNANCY IDENTIFIED    09/21/2019 Genetic Testing   Negative genetic testing:  No pathogenic variants detected on the [facility] Breast Cancer STAT panel and Common Hereditary Cancers panel.  A variant of uncertain significance was identified in the BRCA1 gene, called c.2048A>G (P.[id number]).  The report date is 09/21/2019.  The STAT Breast cancer panel offered by [facility] includes sequencing and rearrangement analysis for the following 9 genes:  ATM, BRCA1, BRCA2, CDH1, CHEK2, PALB2, PTEN, STK11 and TP53.   The  Common Hereditary Cancers Panel offered by [facility] includes sequencing and/or deletion duplication testing of the following 48  genes: APC, ATM, AXIN2, BARD1, BMPR1A, BRCA1, BRCA2, BRIP1, CDH1, CDK4, CDKN2A (p14ARF), CDKN2A (p16INK4a), CHEK2, CTNNA1, DICER1, EPCAM (Deletion/duplication testing only), GREM1 (promoter region deletion/duplication testing only), KIT, MEN1, MLH1, MSH2, MSH3, MSH6, MUTYH, NBN, NF1, NHTL1, PALB2, PDGFRA, PMS2, POLD1, POLE, PTEN, RAD50, RAD51C, RAD51D, RNF43, SDHB, SDHC, SDHD, SMAD4, SMARCA4. STK11, TP53, TSC1, TSC2, and VHL.  The following genes were evaluated for sequence changes only: SDHA and HOXB13 c.251G>A variant only.    09/24/2019 Surgery   RIGHT BREAST LUMPECTOMY WITH SENTINEL LYMPH NODE BIOPSY by Dr Marlou Starks  09/24/19    09/24/2019 Pathology Results   FINAL MICROSCOPIC DIAGNOSIS: 09/24/19   A. LYMPH NODE, RIGHT #1, SENTINEL, BIOPSY:  - There is no evidence of carcinoma in 1 of 1 lymph node (0/1).   B. LYMPH NODE, RIGHT, SENTINEL, BIOPSY:  - There is no evidence of carcinoma in 1 of 1 lymph node (0/1).   C. 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   D. LYMPH NODE, RIGHT #2, SENTINEL, BIOPSY:  - There is no evidence of carcinoma in 1 of 1 lymph node (0/1).   E. LYMPH NODE, RIGHT #3 , SENTINEL, BIOPSY:  - There is no evidence of carcinoma in 1 of 1 lymph node (0/1).   F. LYMPH NODE, RIGHT, SENTINEL, BIOPSY:  - There is no evidence of carcinoma in 1 of 1 lymph node (0/1).   G. LYMPH NODE, RIGHT, SENTINEL, BIOPSY:  -There is no evidence of carcinoma in 1 of 1 lymph node (0/1).   H. 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  - Ductal carcinoma in situ, intermediate grade.  - Perineural invasion is identified.  - The surgical resection margins are negative for carcinoma.  - See oncology table below   I. BREAST, RIGHT ADDITIONAL INFERIOR MARGIN, EXCISION:  - Fibrocystic changes.  - There is no evidence of malignancy.     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 Postoperative findings in the right breast and axilla. Upper normal sized right axillary lymph nodes, nonspecific. 2. 2 mm superior apical segment right upper lobe nodule is technically nonspecific although statistically likely to be benign. 3. 3.6 cm hypodense right adnexal structure, probably a cyst but technically nonspecific. If patient is premenopausal, no follow up is warranted; if early postmenopausal, follow up ultrasound in 6-12 months would be recommended. 4. Other imaging findings of potential clinical significance: Small type 1 hiatal hernia. Descending and sigmoid colon diverticulosis. Small indirect left inguinal hernia contains adipose tissue.   Aortic Atherosclerosis (ICD10-I70.0).   10/26/2019 Imaging   Whole Body Bone scan 10/26/19  IMPRESSION: Nonspecific sites of uptake at the RIGHT ischium and  anterior LEFT sixth rib, cannot exclude metastatic foci.   Otherwise negative exam.     10/29/2019 -  Chemotherapy   Adjuvant AC q2weeks for 4 cycles followed by weekly Taxol for 12 weeks starting 10/29/19       CURRENT THERAPY:  Adjuvant AC q2weeks for 4 cycles starting 10/29/19 followed by weekly Taxol for 12 weeks.   INTERVAL HISTORY:  Paula Davidson is here for a follow up and treatment. She presents to the clinic alone.  She tolerated the first cycle chemotherapy well  overall, had a mild fatigue, slightly low appetite for a few days, and has recovered well.  She denies any episodes of fever, chills, vomiting or diarrhea.  Review of system otherwise negative.   MEDICAL HISTORY:  Past Medical History:  Diagnosis Date  . Anxiety   . Cancer ([place]) 09/24/2019   right breast cancer-had lumpectomy  . Depression   . DUB (dysfunctional uterine bleeding)   . Family history of pancreatic cancer   . Hypertension     SURGICAL HISTORY: Past Surgical History:  Procedure Laterality Date  . BREAST LUMPECTOMY WITH AXILLARY LYMPH NODE BIOPSY Right 09/24/2019   Procedure: RIGHT BREAST LUMPECTOMY WITH SENTINEL LYMPH NODE BIOPSY;  Surgeon: Jovita Kussmaul, MD;  Location: [facility];  Service: General;  Laterality: Right;  . BREAST SURGERY  09/24/2019   Breast lump excised  . FOOT SURGERY    . LAPAROSCOPIC GASTRIC BANDING    . PORTACATH PLACEMENT N/A 10/25/2019   Procedure: INSERTION PORT-A-CATH WITH POSSIBLE ULTRASOUND;  Surgeon: Jovita Kussmaul, MD;  Location: [facility];  Service: General;  Laterality: N/A;  . WISDOM TOOTH EXTRACTION      I have reviewed the social history and family history with the patient and they are unchanged from previous note.  ALLERGIES:  is allergic to hydrochlorothiazide; codeine; and lisinopril.  MEDICATIONS:  Current Outpatient Medications  Medication Sig Dispense Refill  . amitriptyline (ELAVIL) 25 MG tablet Take 25 mg by mouth at bedtime as needed for sleep.    Marland Kitchen amLODipine (NORVASC) 5 MG tablet Take 5 mg by mouth daily.    Marland Kitchen Bioflavonoid Products (ESTER C PO) Take 1 tablet by mouth daily.    . Calcium Carbonate-Vitamin D (CALCIUM + D PO) Take 1 tablet by mouth daily.     . cetirizine (ZYRTEC) 10 MG tablet Take 10 mg by mouth daily as needed for allergies.    . cholecalciferol (VITAMIN D) 25 MCG (1000 UT) tablet Take 1,000 Units by mouth daily.    . citalopram (CELEXA) 20 MG tablet Take 20 mg by mouth daily.    Marland Kitchen  HYDROcodone-acetaminophen (NORCO/VICODIN) 5-325 MG tablet Take 1-2 tablets by mouth every 6 (six) hours as needed for moderate pain or severe pain. 10 tablet 0  . lidocaine-prilocaine (EMLA) cream Apply to affected area once 30 g 3  . losartan (COZAAR) 100 MG tablet Take 100 mg by mouth daily.    . Multiple Vitamin (MULTIVITAMIN WITH MINERALS) TABS tablet Take 1 tablet by mouth daily.    . ondansetron (ZOFRAN) 8 MG tablet Take 1 tablet (8 mg total) by mouth 2 (two) times daily as needed. Start on the third day after chemotherapy. 30 tablet 1  . prochlorperazine (COMPAZINE) 10 MG tablet Take 1 tablet (10 mg total) by mouth every 6 (six) hours as needed (Nausea or vomiting). 30 tablet 1   No current facility-administered medications for this visit.    PHYSICAL EXAMINATION: ECOG PERFORMANCE STATUS: 0 -  Asymptomatic  Vitals:   11/12/19 1247  BP: (!) 152/91  Pulse: 72  Resp: 18  Temp: 98.2 F (36.8 C)  SpO2: 100%   Filed Weights   11/12/19 1247  Weight: 292 lb 9.6 oz (132.7 kg)    GENERAL:alert, no distress and comfortable SKIN: skin color, texture, turgor are normal, no rashes or significant lesions EYES: normal, Conjunctiva are pink and non-injected, sclera clear NECK: supple, thyroid normal size, non-tender, without nodularity LYMPH:  no palpable lymphadenopathy in the cervical, axillary  LUNGS: clear to auscultation and percussion with normal breathing effort HEART: regular rate & rhythm and no murmurs and no lower extremity edema ABDOMEN:abdomen soft, non-tender and normal bowel sounds Musculoskeletal:no cyanosis of digits and no clubbing  NEURO: alert & oriented x 3 with fluent speech, no focal motor/sensory deficits  LABORATORY DATA:  I have reviewed the data as listed CBC Latest Ref Rng & Units 11/12/2019 10/29/2019 10/23/2019  WBC 4.0 - 10.5 K/uL 10.8(H) 8.1 6.9  Hemoglobin 12.0 - 15.0 g/dL 11.5(L) 11.3(L) 11.8(L)  Hematocrit 36.0 - 46.0 % 35.8(L) 34.8(L) 36.7    Platelets 150 - 400 K/uL 263 374 423(H)     CMP Latest Ref Rng & Units 11/12/2019 10/29/2019 10/23/2019  Glucose 70 - 99 mg/dL 88 92 101(H)  BUN 6 - 20 mg/dL _0 Creatinine 0.44 - 1.00 mg/dL 0.71 0.65 0.62  Sodium 135 - 145 mmol/L 139 137 138  Potassium 3.5 - 5.1 mmol/L 3.9 3.8 3.8  Chloride 98 - 111 mmol/L 106 105 103  CO2 22 - 32 mmol/L _1 Calcium 8.9 - 10.3 mg/dL 9.1 8.8(L) 9.7  Total Protein 6.5 - 8.1 g/dL 7.5 7.3 -  Total Bilirubin 0.3 - 1.2 mg/dL <0.2(L) 0.3 -  Alkaline Phos 38 - 126 U/L 83 64 -  AST 15 - 41 U/L 14(L) 14(L) -  ALT 0 - 44 U/L 19 13 -      RADIOGRAPHIC STUDIES: I have personally reviewed the radiological images as listed and agreed with the findings in the report. No results found.   ASSESSMENT & PLAN:  Paula Davidson is a 48 y.o. female with   1Malignant neoplasm of upper-outer quadrant of right breast, StageIB, c(T2N0M0), ER/PR+, HER2-, [place] -She was diagnosed in 08/2019. She has had a right breast mass for 7-8 years and recently grew. Her [place] and Biopsy show grade II invasive mammary carcinoma of right breast and lymph node negative.MRI showed tumor 2.5cm, 1 positive axillary LN and a 7-44m indeterminate mass that biopsies and found to be Fibroadenoma.  -She underwent right lumpectomy and SLNB with Dr. TMarlou Starkson 09/24/19. Her path shows complete surgical resection of tumor with 1/7 positive LN, negative margins. 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  -To reduce her high risk adjuvant chemo I started her on adjuvant IV chemo AC q2weeks for 4 cycles starting 10/29/19 followed by weekly Taxol for 12 weeks -To reduce her risk of local recurrence, adjuvant Radiation is recommended followed by antiestrogen therapy given her ER/PR positive disease to reduce her risk of distant recurrence.  -She tolerated C1 very well overall with mild fatigue and appetite change, she has recovered well. -Labs reviewed and  adequate to proceed with C2 AC today.  -F/u in 2 weeks before cycle 3  -I called her stepmother during her visit, and answered all her questions. -she has participated our observational UPBEAT clinical trail, no AEs.  2. 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 Menorrhagia  -She has severe menorrhagia with long term bleeding. Shepreviouslyrequired IV iron. Her Gyn started Mirena 5 years ago which stopped her periods.  -She had Mirena removed in 08/2019. Her period has not returned yet.  -I discussed given her age she may start menopause soon. Will test her hormonal level in the near futureif she has no menstrual period does not return.  -She has h/o of ovarian cyst. She currently has one of right ovary as seen on 10/26/19 CT scan. Will f/u with Gyn.   4. HTN, Depression, Anxiety, obesity -On Amlodipine, Losartan, Lopressor and Celexa -She notes she lives with roommate in [place].    PLAN: -Labs reviewed and adequate to proceed with C2 AC today  -Lab, flush, f/u and AC in 2 and 4 weeks    No problem-specific Assessment & Plan notes found for this encounter.   No orders of the defined types were placed in this encounter.  All questions were answered. The patient knows to call the clinic with any problems, questions or concerns. No barriers to learning was detected. I spent 20 minutes counseling the patient face to face. The total time spent in the appointment was 25 minutes and more than 50% was on counseling and review of test results     Truitt Merle, MD 11/12/2019  I, Joslyn Devon, am acting as scribe for Truitt Merle, MD.   I have reviewed the above documentation for accuracy and completeness, and I agree with the above.

## 2019-11-12 ENCOUNTER — Inpatient Hospital Stay (HOSPITAL_BASED_OUTPATIENT_CLINIC_OR_DEPARTMENT_OTHER): Payer: BLUE CROSS/BLUE SHIELD | Admitting: Hematology

## 2019-11-12 ENCOUNTER — Other Ambulatory Visit: Payer: Self-pay

## 2019-11-12 ENCOUNTER — Inpatient Hospital Stay: Payer: BLUE CROSS/BLUE SHIELD

## 2019-11-12 VITALS — BP 148/78 | HR 63

## 2019-11-12 VITALS — BP 152/91 | HR 72 | Temp 98.2°F | Resp 18 | Ht 66.5 in | Wt 292.6 lb

## 2019-11-12 DIAGNOSIS — C50411 Malignant neoplasm of upper-outer quadrant of right female breast: Secondary | ICD-10-CM

## 2019-11-12 DIAGNOSIS — I10 Essential (primary) hypertension: Secondary | ICD-10-CM

## 2019-11-12 DIAGNOSIS — Z170 Estrogen receptor positive status [ER+]: Secondary | ICD-10-CM

## 2019-11-12 DIAGNOSIS — Z95828 Presence of other vascular implants and grafts: Secondary | ICD-10-CM

## 2019-11-12 LAB — CMP (CANCER CENTER ONLY)
ALT: 19 U/L (ref 0–44)
AST: 14 U/L — ABNORMAL LOW (ref 15–41)
Albumin: 3.9 g/dL (ref 3.5–5.0)
Alkaline Phosphatase: 83 U/L (ref 38–126)
Anion gap: 8 (ref 5–15)
BUN: 7 mg/dL (ref 6–20)
CO2: 25 mmol/L (ref 22–32)
Calcium: 9.1 mg/dL (ref 8.9–10.3)
Chloride: 106 mmol/L (ref 98–111)
Creatinine: 0.71 mg/dL (ref 0.44–1.00)
GFR, Est AFR Am: >60 mL/min (ref >60)
GFR, Estimated: >60 mL/min (ref >60)
Glucose, Bld: 88 mg/dL (ref 70–99)
Potassium: 3.9 mmol/L (ref 3.5–5.1)
Sodium: 139 mmol/L (ref 135–145)
Total Bilirubin: <0.2 mg/dL — ABNORMAL LOW (ref 0.3–1.2)
Total Protein: 7.5 g/dL (ref 6.5–8.1)

## 2019-11-12 LAB — CBC WITH DIFFERENTIAL (CANCER CENTER ONLY)
Abs Immature Granulocytes: 1.35 10*3/uL — ABNORMAL HIGH (ref 0.00–0.07)
Basophils Absolute: 0.1 10*3/uL (ref 0.0–0.1)
Basophils Relative: 1 %
Eosinophils Absolute: 0.1 10*3/uL (ref 0.0–0.5)
Eosinophils Relative: 1 %
HCT: 35.8 % — ABNORMAL LOW (ref 36.0–46.0)
Hemoglobin: 11.5 g/dL — ABNORMAL LOW (ref 12.0–15.0)
Immature Granulocytes: 13 %
Lymphocytes Relative: 18 %
Lymphs Abs: 2 10*3/uL (ref 0.7–4.0)
MCH: 25.9 pg — ABNORMAL LOW (ref 26.0–34.0)
MCHC: 32.1 g/dL (ref 30.0–36.0)
MCV: 80.6 fL (ref 80.0–100.0)
Monocytes Absolute: 1 10*3/uL (ref 0.1–1.0)
Monocytes Relative: 9 %
Neutro Abs: 6.4 10*3/uL (ref 1.7–7.7)
Neutrophils Relative %: 58 %
Platelet Count: 263 10*3/uL (ref 150–400)
RBC: 4.44 MIL/uL (ref 3.87–5.11)
RDW: 15.7 % — ABNORMAL HIGH (ref 11.5–15.5)
WBC Count: 10.8 10*3/uL — ABNORMAL HIGH (ref 4.0–10.5)
nRBC: 0.3 % — ABNORMAL HIGH (ref 0.0–0.2)

## 2019-11-12 MED ORDER — SODIUM CHLORIDE 0.9% FLUSH
10.0000 mL | INTRAVENOUS | Status: DC | PRN
  Administered 2019-11-12: 10 mL via INTRAVENOUS
  Filled 2019-11-12: qty 10

## 2019-11-12 MED ORDER — PALONOSETRON HCL INJECTION 0.25 MG/5ML
INTRAVENOUS | Status: AC
  Filled 2019-11-12: qty 5

## 2019-11-12 MED ORDER — DOXORUBICIN HCL CHEMO IV INJECTION 2 MG/ML
60.0000 mg/m2 | Freq: Once | INTRAVENOUS | Status: AC
  Administered 2019-11-12: 150 mg via INTRAVENOUS
  Filled 2019-11-12: qty 75

## 2019-11-12 MED ORDER — SODIUM CHLORIDE 0.9 % IV SOLN
600.0000 mg/m2 | Freq: Once | INTRAVENOUS | Status: AC
  Administered 2019-11-12: 1500 mg via INTRAVENOUS
  Filled 2019-11-12: qty 75

## 2019-11-12 MED ORDER — SODIUM CHLORIDE 0.9 % IV SOLN
Freq: Once | INTRAVENOUS | Status: AC
  Filled 2019-11-12: qty 250

## 2019-11-12 MED ORDER — PALONOSETRON HCL INJECTION 0.25 MG/5ML
0.2500 mg | Freq: Once | INTRAVENOUS | Status: AC
  Administered 2019-11-12: 0.25 mg via INTRAVENOUS

## 2019-11-12 MED ORDER — HEPARIN SOD (PORK) LOCK FLUSH 100 UNIT/ML IV SOLN
500.0000 [IU] | Freq: Once | INTRAVENOUS | Status: AC | PRN
  Administered 2019-11-12: 500 [IU]
  Filled 2019-11-12: qty 5

## 2019-11-12 MED ORDER — SODIUM CHLORIDE 0.9 % IV SOLN
Freq: Once | INTRAVENOUS | Status: AC
  Filled 2019-11-12: qty 5

## 2019-11-12 MED ORDER — SODIUM CHLORIDE 0.9% FLUSH
10.0000 mL | INTRAVENOUS | Status: DC | PRN
  Administered 2019-11-12: 10 mL
  Filled 2019-11-12: qty 10

## 2019-11-12 NOTE — Patient Instructions (Signed)
[name] Discharge Instructions for Patients Receiving Chemotherapy  Today you received the following chemotherapy agents: Adriamycin, Cytoxin   To help prevent nausea and vomiting after your treatment, we encourage you to take your nausea medication as directed.    If you develop nausea and vomiting that is not controlled by your nausea medication, call the clinic.   BELOW ARE SYMPTOMS THAT SHOULD BE REPORTED IMMEDIATELY:  *FEVER GREATER THAN 100.5 F  *CHILLS WITH OR WITHOUT FEVER  NAUSEA AND VOMITING THAT IS NOT CONTROLLED WITH YOUR NAUSEA MEDICATION  *UNUSUAL SHORTNESS OF BREATH  *UNUSUAL BRUISING OR BLEEDING  TENDERNESS IN MOUTH AND THROAT WITH OR WITHOUT PRESENCE OF ULCERS  *URINARY PROBLEMS  *BOWEL PROBLEMS  UNUSUAL RASH Items with * indicate a potential emergency and should be followed up as soon as possible.  Feel free to call the clinic should you have any questions or concerns. The clinic phone number is [phone].  Please show the [facility] at check-in to the Emergency Department and triage nurse.

## 2019-11-12 NOTE — Progress Notes (Signed)
Blood return noted before, during, and after Adriamycin IV push.

## 2019-11-12 NOTE — Patient Instructions (Signed)

## 2019-11-13 ENCOUNTER — Telehealth: Payer: Self-pay | Admitting: Hematology

## 2019-11-13 NOTE — Telephone Encounter (Signed)
No los per 12/28.

## 2019-11-14 ENCOUNTER — Encounter: Payer: Self-pay | Admitting: Hematology

## 2019-11-14 ENCOUNTER — Other Ambulatory Visit: Payer: Self-pay

## 2019-11-14 ENCOUNTER — Inpatient Hospital Stay: Payer: BLUE CROSS/BLUE SHIELD

## 2019-11-14 VITALS — BP 138/72 | HR 62 | Temp 98.2°F | Resp 18

## 2019-11-14 DIAGNOSIS — C50411 Malignant neoplasm of upper-outer quadrant of right female breast: Secondary | ICD-10-CM

## 2019-11-14 DIAGNOSIS — Z170 Estrogen receptor positive status [ER+]: Secondary | ICD-10-CM

## 2019-11-14 MED ORDER — PEGFILGRASTIM-JMDB 6 MG/0.6ML ~~LOC~~ SOSY
6.0000 mg | PREFILLED_SYRINGE | Freq: Once | SUBCUTANEOUS | Status: AC
  Administered 2019-11-14: 6 mg via SUBCUTANEOUS

## 2019-11-14 MED ORDER — PEGFILGRASTIM-JMDB 6 MG/0.6ML ~~LOC~~ SOSY
PREFILLED_SYRINGE | SUBCUTANEOUS | Status: AC
  Filled 2019-11-14: qty 0.6

## 2019-11-14 NOTE — Patient Instructions (Signed)

## 2019-11-20 ENCOUNTER — Encounter: Payer: Self-pay | Admitting: *Deleted

## 2019-11-26 NOTE — Progress Notes (Signed)
Paula Davidson   [phone] [phone]) [phone]   Clinic Follow up Note   Patient Care Team: Burnett Sheng, MD as PCP - General (Family Medicine) Mauro Kaufmann, RN as Oncology Nurse Navigator Rockwell Germany, RN as Oncology Nurse Navigator Jovita Kussmaul, MD as Consulting Physician (General Surgery) Truitt Merle, MD as Consulting Physician (Hematology) Eppie Gibson, MD as Attending Physician (Radiation Oncology)  Date of Service:  11/29/2019  CHIEF COMPLAINT: F/u of right breast cancer  SUMMARY OF ONCOLOGIC HISTORY: Oncology History Overview Note  Cancer Staging Malignant neoplasm of upper-outer quadrant of right breast in female, estrogen receptor positive ([place]) Staging form: Breast, AJCC 8th Edition - Clinical stage from 08/28/2019: Stage IB (cT2, cN0, cM0, G2, ER+, PR+, HER2-) - Signed by Truitt Merle, MD on 09/04/2019 - Pathologic stage from 09/24/2019: Stage IB (pT2, pN1a, cM0, G2, ER+, PR+, HER2-) - Signed by Truitt Merle, MD on 10/10/2019    Malignant neoplasm of upper-outer quadrant of right breast in female, estrogen receptor positive ([place])  08/24/2019 Mammogram   Diagnostic mammogram and [place] 08/24/19 IMPRESSION: Suspicious right breast mass 10 o'clock 1 cm from the nipple measuring 3.3 x 1.9 x 1.9 cm and axillary adenopathy.   08/28/2019 Initial Biopsy   Diagnosis 08/28/19 1. Breast, right, needle core biopsy, 10 o'clock - INVASIVE MAMMARY CARCINOMA, GRADE II. - SEE MICROSCOPIC DESCRIPTION. 2. Lymph node, needle/core biopsy, right axilla - BENIGN LYMPH NODE. - NO METASTATIC CARCINOMA IDENTIFIED.    08/28/2019 Receptors her2   Results: IMMUNOHISTOCHEMICAL AND MORPHOMETRIC ANALYSIS PERFORMED MANUALLY The tumor cells are NEGATIVE for Her2 (1+). Estrogen Receptor: 100%, POSITIVE, STRONG STAINING INTENSITY Progesterone Receptor: 100%, POSITIVE, STRONG STAINING INTENSITY Proliferation Marker Ki67: 10%   08/28/2019 Cancer Staging   Staging  form: Breast, AJCC 8th Edition - Clinical stage from 08/28/2019: Stage IB (cT2, cN0, cM0, G2, ER+, PR+, HER2-) - Signed by Truitt Merle, MD on 09/04/2019   08/31/2019 Initial Diagnosis   Malignant neoplasm of upper-outer quadrant of right breast in female, estrogen receptor positive ([place])   09/07/2019 Breast MRI   Breast MRI 09/07/19  IMPRESSION: 1. 2.5 cm known malignancy in the anterior right breast, superficial depth near the nipple. 2. 7-8 mm indeterminate mass in the central upper outer quadrant of the right breast. 3. One right axillary lymph node with apparent mild cortical thickening, which lies slightly anterior and inferior to the biopsied right axillary lymph node ( benign reactive on pathology). This is most likely also benign given the pathology from the adjacent biopsied lymph node. 4. No evidence of left breast malignancy.   09/17/2019 Imaging   [place] right breast 09/17/19  IMPRESSION: Several benign appearing masses in the upper outer quadrant of the right breast without a definite correlate to the finding seen on prior MRI. The right axillary lymph nodes appear similar to the recently biopsied benign lymph node.   09/20/2019 Pathology Results   Diagnosis 09/20/19  Breast, right, needle core biopsy, upper outer quadrant - FIBROADENOMA - NO MALIGNANCY IDENTIFIED    09/21/2019 Genetic Testing   Negative genetic testing:  No pathogenic variants detected on the [facility] Breast Cancer STAT panel and Common Hereditary Cancers panel.  A variant of uncertain significance was identified in the BRCA1 gene, called c.2048A>G ([id number]).  The report date is 09/21/2019.  The STAT Breast cancer panel offered by [facility] includes sequencing and rearrangement analysis for the following 9 genes:  ATM, BRCA1, BRCA2, CDH1, CHEK2, PALB2, PTEN, STK11 and TP53.   The Common  Hereditary Cancers Panel offered by [facility] includes sequencing and/or deletion duplication testing of the following 48  genes: APC, ATM, AXIN2, BARD1, BMPR1A, BRCA1, BRCA2, BRIP1, CDH1, CDK4, CDKN2A (p14ARF), CDKN2A (p16INK4a), CHEK2, CTNNA1, DICER1, EPCAM (Deletion/duplication testing only), GREM1 (promoter region deletion/duplication testing only), KIT, MEN1, MLH1, MSH2, MSH3, MSH6, MUTYH, NBN, NF1, NHTL1, PALB2, PDGFRA, PMS2, POLD1, POLE, PTEN, RAD50, RAD51C, RAD51D, RNF43, SDHB, SDHC, SDHD, SMAD4, SMARCA4. STK11, TP53, TSC1, TSC2, and VHL.  The following genes were evaluated for sequence changes only: SDHA and HOXB13 c.251G>A variant only.    09/24/2019 Surgery   RIGHT BREAST LUMPECTOMY WITH SENTINEL LYMPH NODE BIOPSY by Dr Marlou Starks  09/24/19    09/24/2019 Pathology Results   FINAL MICROSCOPIC DIAGNOSIS: 09/24/19   A. LYMPH NODE, RIGHT #1, SENTINEL, BIOPSY:  - There is no evidence of carcinoma in 1 of 1 lymph node (0/1).   B. LYMPH NODE, RIGHT, SENTINEL, BIOPSY:  - There is no evidence of carcinoma in 1 of 1 lymph node (0/1).   C. 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   D. LYMPH NODE, RIGHT #2, SENTINEL, BIOPSY:  - There is no evidence of carcinoma in 1 of 1 lymph node (0/1).   E. LYMPH NODE, RIGHT #3 , SENTINEL, BIOPSY:  - There is no evidence of carcinoma in 1 of 1 lymph node (0/1).   F. LYMPH NODE, RIGHT, SENTINEL, BIOPSY:  - There is no evidence of carcinoma in 1 of 1 lymph node (0/1).   G. LYMPH NODE, RIGHT, SENTINEL, BIOPSY:  -There is no evidence of carcinoma in 1 of 1 lymph node (0/1).   H. 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  - Ductal carcinoma in situ, intermediate grade.  - Perineural invasion is identified.  - The surgical resection margins are negative for carcinoma.  - See oncology table below   I. BREAST, RIGHT ADDITIONAL INFERIOR MARGIN, EXCISION:  - Fibrocystic changes.  - There is no evidence of malignancy.     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 Postoperative findings in the right breast and axilla. Upper normal sized right axillary lymph nodes, nonspecific. 2. 2 mm superior apical segment right upper lobe nodule is technically nonspecific although statistically likely to be benign. 3. 3.6 cm hypodense right adnexal structure, probably a cyst but technically nonspecific. If patient is premenopausal, no follow up is warranted; if early postmenopausal, follow up ultrasound in 6-12 months would be recommended. 4. Other imaging findings of potential clinical significance: Small type 1 hiatal hernia. Descending and sigmoid colon diverticulosis. Small indirect left inguinal hernia contains adipose tissue.   Aortic Atherosclerosis (ICD10-I70.0).   10/26/2019 Imaging   Whole Body Bone scan 10/26/19  IMPRESSION: Nonspecific sites of uptake at the RIGHT ischium and anterior  LEFT sixth rib, cannot exclude metastatic foci.   Otherwise negative exam.     10/29/2019 -  Chemotherapy   Adjuvant AC q2weeks for 4 cycles followed by weekly Taxol for 12 weeks starting 10/29/19       CURRENT THERAPY:  Adjuvant AC q2weeks for 4 cycles starting 10/29/19 followed by Taxol q2weeks.    INTERVAL HISTORY:  Natalie Mceuen is here for a follow up and treatment. She presents to the clinic alone. She called her family to be included in her visit. She notes she  is doing well.  She notes she has complete hair loss and shaved herself bald. She notes blackness and darkness of tongue and palms. She denies any pain from this. She is able to eat adequately. She notes mild nausea and antiemetics resolved this. She has lost weight due to lower appetite and she is not eating her normal foods. She notes she is still working, mostly from home.    REVIEW OF SYSTEMS:   Constitutional: Denies fevers, chills or abnormal weight loss Eyes: Denies blurriness of vision Ears, nose, mouth, throat, and face: Denies mucositis or sore throat (+) Darkening of tongue  Respiratory: Denies cough, dyspnea or wheezes Cardiovascular: Denies palpitation, chest discomfort or lower extremity swelling Gastrointestinal:  Denies nausea, heartburn or change in bowel habits Skin: Denies abnormal skin rashes (+) Total hair loss and darkening of palms. Lymphatics: Denies new lymphadenopathy or easy bruising Neurological:Denies numbness, tingling or new weaknesses Behavioral/Psych: Mood is stable, no new changes  All other systems were reviewed with the patient and are negative.  MEDICAL HISTORY:  Past Medical History:  Diagnosis Date  . Anxiety   . Cancer ([place]) 09/24/2019   right breast cancer-had lumpectomy  . Depression   . DUB (dysfunctional uterine bleeding)   . Family history of pancreatic cancer   . Hypertension     SURGICAL HISTORY: Past Surgical History:  Procedure Laterality Date  . BREAST LUMPECTOMY WITH AXILLARY LYMPH NODE BIOPSY Right 09/24/2019   Procedure: RIGHT BREAST LUMPECTOMY WITH SENTINEL LYMPH NODE BIOPSY;  Surgeon: Jovita Kussmaul, MD;  Location: [facility];  Service: General;  Laterality: Right;  . BREAST SURGERY  09/24/2019   Breast lump excised  . FOOT SURGERY    . LAPAROSCOPIC GASTRIC BANDING    . PORTACATH PLACEMENT N/A 10/25/2019   Procedure: INSERTION PORT-A-CATH WITH POSSIBLE ULTRASOUND;  Surgeon: Jovita Kussmaul, MD;  Location: [facility];  Service: General;  Laterality: N/A;  . WISDOM TOOTH EXTRACTION      I have reviewed the social history and family history with the patient and they are unchanged from previous note.  ALLERGIES:  is allergic to hydrochlorothiazide; codeine; and lisinopril.  MEDICATIONS:  Current Outpatient Medications  Medication Sig Dispense Refill  . amitriptyline (ELAVIL) 25 MG tablet Take 25 mg by mouth at bedtime as needed for sleep.    Marland Kitchen amLODipine (NORVASC) 5 MG tablet Take 5 mg by mouth daily.    Marland Kitchen Bioflavonoid Products (ESTER C PO) Take 1 tablet by mouth daily.    . Calcium Carbonate-Vitamin D (CALCIUM + D PO) Take 1 tablet by mouth daily.     . cetirizine (ZYRTEC) 10 MG tablet Take 10 mg by mouth daily as needed for allergies.    . cholecalciferol (VITAMIN D) 25 MCG (1000 UT) tablet Take 1,000 Units by mouth daily.    . citalopram (CELEXA) 20 MG tablet Take 20 mg by mouth daily.    Marland Kitchen HYDROcodone-acetaminophen (NORCO/VICODIN) 5-325  MG tablet Take 1-2 tablets by mouth every 6 (six) hours as needed for moderate pain or severe pain. 10 tablet 0  . lidocaine-prilocaine (EMLA) cream Apply to affected area once 30 g 3  . losartan (COZAAR) 100 MG tablet Take 100 mg by mouth daily.    . Multiple Vitamin (MULTIVITAMIN WITH MINERALS) TABS tablet Take 1 tablet by mouth daily.    . ondansetron (ZOFRAN) 8 MG tablet Take 1 tablet (8 mg total) by mouth 2 (two) times daily as needed. Start on the third day after chemotherapy. 30 tablet 1  . prochlorperazine (COMPAZINE) 10 MG tablet Take 1 tablet (10 mg total) by mouth every 6 (six) hours as needed (Nausea or vomiting). 30 tablet 1   No current facility-administered medications for this visit.    PHYSICAL EXAMINATION: ECOG PERFORMANCE STATUS: 1 - Symptomatic but completely ambulatory  Vitals:   11/29/19 1343  BP: 136/82  Pulse: 85  Resp: 18  Temp: 98.3 F (36.8 C)  SpO2: 100%   Filed Weights   11/29/19 1343  Weight: 285 lb 4.8 oz (129.4 kg)     GENERAL:alert, no distress and comfortable SKIN: skin color, texture, turgor are normal, no rashes or significant lesions (+) darkening of palms EYES: normal, Conjunctiva are pink and non-injected, sclera clear OROPHARYNX:no exudate, no erythema and lips, buccal mucosa normal (+) darkening of tongue  NECK: supple, thyroid normal size, non-tender, without nodularity LYMPH:  no palpable lymphadenopathy in the cervical, axillary  LUNGS: clear to auscultation and percussion with normal breathing effort HEART: regular rate & rhythm and no murmurs and no lower extremity edema ABDOMEN:abdomen soft, non-tender and normal bowel sounds Musculoskeletal:no cyanosis of digits and no clubbing  NEURO: alert & oriented x 3 with fluent speech, no focal motor/sensory deficits  LABORATORY DATA:  I have reviewed the data as listed CBC Latest Ref Rng & Units 11/29/2019 11/12/2019 10/29/2019  WBC 4.0 - 10.5 K/uL 8.0 10.8(H) 8.1  Hemoglobin 12.0 - 15.0 g/dL 11.3(L) 11.5(L) 11.3(L)  Hematocrit 36.0 - 46.0 % 35.1(L) 35.8(L) 34.8(L)  Platelets 150 - 400 K/uL 212 263 374     CMP Latest Ref Rng & Units 11/29/2019 11/12/2019 10/29/2019  Glucose 70 - 99 mg/dL 106(H) 88 92  BUN 6 - 20 mg/dL 8 7 7   Creatinine 0.44 - 1.00 mg/dL 0.79 0.71 0.65  Sodium 135 - 145 mmol/L 139 139 137  Potassium 3.5 - 5.1 mmol/L 3.7 3.9 3.8  Chloride 98 - 111 mmol/L 105 106 105  CO2 22 - 32 mmol/L 23 25 24   Calcium 8.9 - 10.3 mg/dL 9.0 9.1 8.8(L)  Total Protein 6.5 - 8.1 g/dL 7.6 7.5 7.3  Total Bilirubin 0.3 - 1.2 mg/dL <0.2(L) <0.2(L) 0.3  Alkaline Phos 38 - 126 U/L 70 83 64  AST 15 - 41 U/L 26 14(L) 14(L)  ALT 0 - 44 U/L 31 19 13       RADIOGRAPHIC STUDIES: I have personally reviewed the radiological images as listed and agreed with the findings in the report. No results found.   ASSESSMENT & PLAN:  Paula Davidson is a 49 y.o. female with   1Malignant neoplasm of upper-outer quadrant of right breast, StageIB, c(T2N0M0),  ER/PR+, HER2-, [place] Fresh -She was diagnosed in 08/2019. Shehas had a right breast mass for 7-8 yearsand recently grew.Her [place] and Biopsy show grade II invasive mammary carcinoma of right breast and lymph node negative.MRI showed tumor 2.5cm, 1 positive axillary LN and a 7-63m indeterminate mass that biopsies and found to  be Fibroadenoma.  -She underwent right lumpectomy and SLNB with Dr. Marlou Starks on 09/24/19.Her mammaprint showedHigh Risk Luminal Type B with 29% risk of recurrence in 10 years. -To reduce her high risk adjuvant chemo I started her on adjuvant IV chemoAC q2weeks for 4 cycles starting 10/29/19 followed by weekly Taxol for 12 weeks or Taxol every 2 weeks for 4 cycles  -To reduce her risk of local recurrence, adjuvant Radiation is recommended followed by antiestrogen therapy given her ER/PR positive disease to reduce her risk of distant recurrence.  -S/p C2 she has lowered appetite, change in diet, mild nausea, darkening of hands and tongue and mild weight loss. I encouraged her to eat adequately to maintain weight. She has recovered well  -Labs reviewed, with mild anemia. Overall adequate to proceed with C3 AC today. I discussed after next cycle she will proceed with Taxol. She is willing to try higher dose Taxol q2weeks. If not tolerable with make weekly.  -She is eligible for neuropathy study on Taxol. She is interested. -she has participated our observational UP[facility]AT clinical trail, no AEs.  -F/u in 2 weeks  2. Genetic Testingnegative for pathogenetic mutations -She does haveVUS of BRCA1 c.2048A>G  3. Menorrhagia  -Shehassevere menorrhagia with long term bleeding. Shepreviouslyrequired IV iron. Her Gyn started Mirena 5 years ago which stopped her periods.  -She had Mirena removed in 08/2019. Her period has not returned yet. -I discussed given her age she may start menopause soon. Will test her hormonal level in the near futureif she has no menstrual perioddoes not return.   -She has h/o of ovarian cyst. She currently has one of right ovary as seen on 10/26/19 CT scan. Will f/u with Gyn. -She has not had period on chemo so far.   4. HTN, Depression, Anxiety, obesity -On Amlodipine, Losartan, Lopressorand Celexa -She notes she lives with [facility].   PLAN: Charity fundraiser about neuropathy study -Labs reviewed and adequate to proceed with C3 AC today  -Lab, flush, f/u and cycle 4 AC in 2 weeks, this will be followed by Taxol every 2 weeks X4   No problem-specific Assessment & Plan notes found for this encounter.   No orders of the defined types were placed in this encounter.  All questions were answered. The patient knows to call the clinic with any problems, questions or concerns. No barriers to learning was detected. The total time spent in the appointment was 30 minutes.     Truitt Merle, MD 11/29/2019   I, Joslyn Devon, am acting as scribe for Truitt Merle, MD.   I have reviewed the above documentation for accuracy and completeness, and I agree with the above.

## 2019-11-28 ENCOUNTER — Encounter: Payer: Self-pay | Admitting: *Deleted

## 2019-11-29 ENCOUNTER — Inpatient Hospital Stay: Payer: BLUE CROSS/BLUE SHIELD | Attending: Hematology

## 2019-11-29 ENCOUNTER — Other Ambulatory Visit: Payer: Self-pay

## 2019-11-29 ENCOUNTER — Inpatient Hospital Stay: Payer: BLUE CROSS/BLUE SHIELD

## 2019-11-29 ENCOUNTER — Inpatient Hospital Stay (HOSPITAL_BASED_OUTPATIENT_CLINIC_OR_DEPARTMENT_OTHER): Payer: BLUE CROSS/BLUE SHIELD | Admitting: Hematology

## 2019-11-29 VITALS — BP 136/82 | HR 85 | Temp 98.3°F | Resp 18 | Ht 66.5 in | Wt 285.3 lb

## 2019-11-29 DIAGNOSIS — I700 Atherosclerosis of aorta: Secondary | ICD-10-CM | POA: Diagnosis not present

## 2019-11-29 DIAGNOSIS — C50411 Malignant neoplasm of upper-outer quadrant of right female breast: Secondary | ICD-10-CM | POA: Diagnosis not present

## 2019-11-29 DIAGNOSIS — R634 Abnormal weight loss: Secondary | ICD-10-CM | POA: Diagnosis not present

## 2019-11-29 DIAGNOSIS — E669 Obesity, unspecified: Secondary | ICD-10-CM | POA: Diagnosis not present

## 2019-11-29 DIAGNOSIS — F418 Other specified anxiety disorders: Secondary | ICD-10-CM | POA: Diagnosis not present

## 2019-11-29 DIAGNOSIS — I10 Essential (primary) hypertension: Secondary | ICD-10-CM | POA: Insufficient documentation

## 2019-11-29 DIAGNOSIS — Z5111 Encounter for antineoplastic chemotherapy: Secondary | ICD-10-CM | POA: Insufficient documentation

## 2019-11-29 DIAGNOSIS — D649 Anemia, unspecified: Secondary | ICD-10-CM | POA: Diagnosis not present

## 2019-11-29 DIAGNOSIS — Z79899 Other long term (current) drug therapy: Secondary | ICD-10-CM | POA: Diagnosis not present

## 2019-11-29 DIAGNOSIS — Z7689 Persons encountering health services in other specified circumstances: Secondary | ICD-10-CM | POA: Diagnosis not present

## 2019-11-29 DIAGNOSIS — Z800 Family history of malignant neoplasm of digestive organs: Secondary | ICD-10-CM | POA: Diagnosis not present

## 2019-11-29 DIAGNOSIS — N920 Excessive and frequent menstruation with regular cycle: Secondary | ICD-10-CM | POA: Diagnosis not present

## 2019-11-29 DIAGNOSIS — K449 Diaphragmatic hernia without obstruction or gangrene: Secondary | ICD-10-CM | POA: Diagnosis not present

## 2019-11-29 DIAGNOSIS — R630 Anorexia: Secondary | ICD-10-CM | POA: Diagnosis not present

## 2019-11-29 DIAGNOSIS — Z170 Estrogen receptor positive status [ER+]: Secondary | ICD-10-CM

## 2019-11-29 LAB — CMP (CANCER CENTER ONLY)
ALT: 31 U/L (ref 0–44)
AST: 26 U/L (ref 15–41)
Albumin: 3.9 g/dL (ref 3.5–5.0)
Alkaline Phosphatase: 70 U/L (ref 38–126)
Anion gap: 11 (ref 5–15)
BUN: 8 mg/dL (ref 6–20)
CO2: 23 mmol/L (ref 22–32)
Calcium: 9 mg/dL (ref 8.9–10.3)
Chloride: 105 mmol/L (ref 98–111)
Creatinine: 0.79 mg/dL (ref 0.44–1.00)
GFR, Est AFR Am: >60 mL/min (ref >60)
GFR, Estimated: >60 mL/min (ref >60)
Glucose, Bld: 106 mg/dL — ABNORMAL HIGH (ref 70–99)
Potassium: 3.7 mmol/L (ref 3.5–5.1)
Sodium: 139 mmol/L (ref 135–145)
Total Bilirubin: <0.2 mg/dL — ABNORMAL LOW (ref 0.3–1.2)
Total Protein: 7.6 g/dL (ref 6.5–8.1)

## 2019-11-29 LAB — CBC WITH DIFFERENTIAL (CANCER CENTER ONLY)
Abs Immature Granulocytes: 0.48 10*3/uL — ABNORMAL HIGH (ref 0.00–0.07)
Basophils Absolute: 0.1 10*3/uL (ref 0.0–0.1)
Basophils Relative: 1 %
Eosinophils Absolute: 0.1 10*3/uL (ref 0.0–0.5)
Eosinophils Relative: 1 %
HCT: 35.1 % — ABNORMAL LOW (ref 36.0–46.0)
Hemoglobin: 11.3 g/dL — ABNORMAL LOW (ref 12.0–15.0)
Immature Granulocytes: 6 %
Lymphocytes Relative: 19 %
Lymphs Abs: 1.5 10*3/uL (ref 0.7–4.0)
MCH: 26.1 pg (ref 26.0–34.0)
MCHC: 32.2 g/dL (ref 30.0–36.0)
MCV: 81.1 fL (ref 80.0–100.0)
Monocytes Absolute: 0.8 10*3/uL (ref 0.1–1.0)
Monocytes Relative: 10 %
Neutro Abs: 5.1 10*3/uL (ref 1.7–7.7)
Neutrophils Relative %: 63 %
Platelet Count: 212 10*3/uL (ref 150–400)
RBC: 4.33 MIL/uL (ref 3.87–5.11)
RDW: 16.5 % — ABNORMAL HIGH (ref 11.5–15.5)
WBC Count: 8 10*3/uL (ref 4.0–10.5)
nRBC: 0 % (ref 0.0–0.2)

## 2019-11-29 MED ORDER — SODIUM CHLORIDE 0.9% FLUSH
10.0000 mL | INTRAVENOUS | Status: DC | PRN
  Administered 2019-11-29: 10 mL
  Filled 2019-11-29: qty 10

## 2019-11-29 MED ORDER — SODIUM CHLORIDE 0.9 % IV SOLN
600.0000 mg/m2 | Freq: Once | INTRAVENOUS | Status: AC
  Administered 2019-11-29: 1500 mg via INTRAVENOUS
  Filled 2019-11-29: qty 75

## 2019-11-29 MED ORDER — DOXORUBICIN HCL CHEMO IV INJECTION 2 MG/ML
60.0000 mg/m2 | Freq: Once | INTRAVENOUS | Status: AC
  Administered 2019-11-29: 150 mg via INTRAVENOUS
  Filled 2019-11-29: qty 75

## 2019-11-29 MED ORDER — PALONOSETRON HCL INJECTION 0.25 MG/5ML
0.2500 mg | Freq: Once | INTRAVENOUS | Status: AC
  Administered 2019-11-29: 0.25 mg via INTRAVENOUS

## 2019-11-29 MED ORDER — HEPARIN SOD (PORK) LOCK FLUSH 100 UNIT/ML IV SOLN
500.0000 [IU] | Freq: Once | INTRAVENOUS | Status: AC | PRN
  Administered 2019-11-29: 500 [IU]
  Filled 2019-11-29: qty 5

## 2019-11-29 MED ORDER — PALONOSETRON HCL INJECTION 0.25 MG/5ML
INTRAVENOUS | Status: AC
  Filled 2019-11-29: qty 5

## 2019-11-29 MED ORDER — SODIUM CHLORIDE 0.9 % IV SOLN
Freq: Once | INTRAVENOUS | Status: AC
  Filled 2019-11-29: qty 250

## 2019-11-29 MED ORDER — SODIUM CHLORIDE 0.9 % IV SOLN
Freq: Once | INTRAVENOUS | Status: AC
  Filled 2019-11-29: qty 5

## 2019-11-29 NOTE — Patient Instructions (Signed)
[facility] Discharge Instructions for Patients Receiving Chemotherapy  Today you received the following chemotherapy agents: Adriamycin, Cytoxan  To help prevent nausea and vomiting after your treatment, we encourage you to take your nausea medication as directed.   If you develop nausea and vomiting that is not controlled by your nausea medication, call the clinic.   BELOW ARE SYMPTOMS THAT SHOULD BE REPORTED IMMEDIATELY:  *FEVER GREATER THAN 100.5 F  *CHILLS WITH OR WITHOUT FEVER  NAUSEA AND VOMITING THAT IS NOT CONTROLLED WITH YOUR NAUSEA MEDICATION  *UNUSUAL SHORTNESS OF BREATH  *UNUSUAL BRUISING OR BLEEDING  TENDERNESS IN MOUTH AND THROAT WITH OR WITHOUT PRESENCE OF ULCERS  *URINARY PROBLEMS  *BOWEL PROBLEMS  UNUSUAL RASH Items with * indicate a potential emergency and should be followed up as soon as possible.  Feel free to call the clinic should you have any questions or concerns. The clinic phone number is [phone].  Please show the [facility] at check-in to the Emergency Department and triage nurse.

## 2019-11-30 ENCOUNTER — Encounter: Payer: Self-pay | Admitting: Hematology

## 2019-11-30 ENCOUNTER — Telehealth: Payer: Self-pay | Admitting: Hematology

## 2019-11-30 NOTE — Telephone Encounter (Signed)
No los per 1/14.

## 2019-12-01 ENCOUNTER — Inpatient Hospital Stay: Payer: BLUE CROSS/BLUE SHIELD

## 2019-12-01 VITALS — BP 117/70 | HR 79 | Temp 97.6°F | Resp 18

## 2019-12-01 DIAGNOSIS — C50411 Malignant neoplasm of upper-outer quadrant of right female breast: Secondary | ICD-10-CM | POA: Diagnosis not present

## 2019-12-01 MED ORDER — PEGFILGRASTIM-JMDB 6 MG/0.6ML ~~LOC~~ SOSY
6.0000 mg | PREFILLED_SYRINGE | Freq: Once | SUBCUTANEOUS | Status: AC
  Administered 2019-12-01: 6 mg via SUBCUTANEOUS

## 2019-12-12 ENCOUNTER — Other Ambulatory Visit: Payer: Self-pay | Admitting: Hematology

## 2019-12-12 ENCOUNTER — Encounter: Payer: Self-pay | Admitting: Hematology

## 2019-12-12 MED ORDER — LORAZEPAM 0.5 MG PO TABS: 0.5000 mg | ORAL_TABLET | Freq: Three times a day (TID) | ORAL | 0 refills | Status: DC | PRN

## 2019-12-12 MED ORDER — PROMETHAZINE HCL 25 MG PO TABS: 25.0000 mg | ORAL_TABLET | Freq: Four times a day (QID) | ORAL | 0 refills | Status: DC | PRN

## 2019-12-13 ENCOUNTER — Inpatient Hospital Stay: Payer: BLUE CROSS/BLUE SHIELD

## 2019-12-13 ENCOUNTER — Inpatient Hospital Stay: Payer: BLUE CROSS/BLUE SHIELD | Admitting: Hematology

## 2019-12-13 ENCOUNTER — Telehealth: Payer: Self-pay | Admitting: Hematology

## 2019-12-13 NOTE — Telephone Encounter (Signed)
R/s appt per 1/27 sch message - unable to reach pt - left message with appt date and time

## 2019-12-15 ENCOUNTER — Inpatient Hospital Stay: Payer: BLUE CROSS/BLUE SHIELD

## 2019-12-17 NOTE — Progress Notes (Signed)
[place]   Telephone:(336) [phone] Fax:(336) [phone]   Clinic Follow up Note   Patient Care Team: Burnett Sheng, MD as PCP - General (Family Medicine) Mauro Kaufmann, RN as Oncology Nurse Navigator Rockwell Germany, RN as Oncology Nurse Navigator Jovita Kussmaul, MD as Consulting Physician (General Surgery) Truitt Merle, MD as Consulting Physician (Hematology) Eppie Gibson, MD as Attending Physician (Radiation Oncology)  Date of Service:  12/21/2019  CHIEF COMPLAINT: F/u of right breast cancer  SUMMARY OF ONCOLOGIC HISTORY: Oncology History Overview Note  Cancer Staging Malignant neoplasm of upper-outer quadrant of right breast in female, estrogen receptor positive ([place]) Staging form: Breast, AJCC 8th Edition - Clinical stage from 08/28/2019: Stage IB (cT2, cN0, cM0, G2, ER+, PR+, HER2-) - Signed by Truitt Merle, MD on 09/04/2019 - Pathologic stage from 09/24/2019: Stage IB (pT2, pN1a, cM0, G2, ER+, PR+, HER2-) - Signed by Truitt Merle, MD on 10/10/2019    Malignant neoplasm of upper-outer quadrant of right breast in female, estrogen receptor positive ([facility])  08/24/2019 Mammogram   Diagnostic mammogram and [place] 08/24/19 IMPRESSION: Suspicious right breast mass 10 o'clock 1 cm from the nipple measuring 3.3 x 1.9 x 1.9 cm and axillary adenopathy.   08/28/2019 Initial Biopsy   Diagnosis 08/28/19 1. Breast, right, needle core biopsy, 10 o'clock - INVASIVE MAMMARY CARCINOMA, GRADE II. - SEE MICROSCOPIC DESCRIPTION. 2. Lymph node, needle/core biopsy, right axilla - BENIGN LYMPH NODE. - NO METASTATIC CARCINOMA IDENTIFIED.    08/28/2019 Receptors her2   Results: IMMUNOHISTOCHEMICAL AND MORPHOMETRIC ANALYSIS PERFORMED MANUALLY The tumor cells are NEGATIVE for Her2 (1+). Estrogen Receptor: 100%, POSITIVE, STRONG STAINING INTENSITY Progesterone Receptor: 100%, POSITIVE, STRONG STAINING INTENSITY Proliferation Marker Ki67: 10%   08/28/2019 Cancer Staging   Staging form:  Breast, AJCC 8th Edition - Clinical stage from 08/28/2019: Stage IB (cT2, cN0, cM0, G2, ER+, PR+, HER2-) - Signed by Truitt Merle, MD on 09/04/2019   08/31/2019 Initial Diagnosis   Malignant neoplasm of upper-outer quadrant of right breast in female, estrogen receptor positive ([place])   09/07/2019 Breast MRI   Breast MRI 09/07/19  IMPRESSION: 1. 2.5 cm known malignancy in the anterior right breast, superficial depth near the nipple. 2. 7-8 mm indeterminate mass in the central upper outer quadrant of the right breast. 3. One right axillary lymph node with apparent mild cortical thickening, which lies slightly anterior and inferior to the biopsied right axillary lymph node ( benign reactive on pathology). This is most likely also benign given the pathology from the adjacent biopsied lymph node. 4. No evidence of left breast malignancy.   09/17/2019 Imaging   [place] right breast 09/17/19  IMPRESSION: Several benign appearing masses in the upper outer quadrant of the right breast without a definite correlate to the finding seen on prior MRI. The right axillary lymph nodes appear similar to the recently biopsied benign lymph node.   09/20/2019 Pathology Results   Diagnosis 09/20/19  Breast, right, needle core biopsy, upper outer quadrant - FIBROADENOMA - NO MALIGNANCY IDENTIFIED    09/21/2019 Genetic Testing   Negative genetic testing:  No pathogenic variants detected on the [facility] Breast Cancer STAT panel and Common Hereditary Cancers panel.  A variant of uncertain significance was identified in the BRCA1 gene, called c.2048A>G ([id number]).  The report date is 09/21/2019.  The STAT Breast cancer panel offered by [facility] includes sequencing and rearrangement analysis for the following 9 genes:  ATM, BRCA1, BRCA2, CDH1, CHEK2, PALB2, PTEN, STK11 and TP53.   The  Common Hereditary Cancers Panel offered by [facility] includes sequencing and/or deletion duplication testing of the following 48 genes:  APC, ATM, AXIN2, BARD1, BMPR1A, BRCA1, BRCA2, BRIP1, CDH1, CDK4, CDKN2A (p14ARF), CDKN2A (p16INK4a), CHEK2, CTNNA1, DICER1, EPCAM (Deletion/duplication testing only), GREM1 (promoter region deletion/duplication testing only), KIT, MEN1, MLH1, MSH2, MSH3, MSH6, MUTYH, NBN, NF1, NHTL1, PALB2, PDGFRA, PMS2, POLD1, POLE, PTEN, RAD50, RAD51C, RAD51D, RNF43, SDHB, SDHC, SDHD, SMAD4, SMARCA4. STK11, TP53, TSC1, TSC2, and VHL.  The following genes were evaluated for sequence changes only: SDHA and HOXB13 c.251G>A variant only.    09/24/2019 Surgery   RIGHT BREAST LUMPECTOMY WITH SENTINEL LYMPH NODE BIOPSY by Dr Marlou Starks  09/24/19    09/24/2019 Pathology Results   FINAL MICROSCOPIC DIAGNOSIS: 09/24/19   A. LYMPH NODE, RIGHT #1, SENTINEL, BIOPSY:  - There is no evidence of carcinoma in 1 of 1 lymph node (0/1).   B. LYMPH NODE, RIGHT, SENTINEL, BIOPSY:  - There is no evidence of carcinoma in 1 of 1 lymph node (0/1).   C. 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   D. LYMPH NODE, RIGHT #2, SENTINEL, BIOPSY:  - There is no evidence of carcinoma in 1 of 1 lymph node (0/1).   E. LYMPH NODE, RIGHT #3 , SENTINEL, BIOPSY:  - There is no evidence of carcinoma in 1 of 1 lymph node (0/1).   F. LYMPH NODE, RIGHT, SENTINEL, BIOPSY:  - There is no evidence of carcinoma in 1 of 1 lymph node (0/1).   G. LYMPH NODE, RIGHT, SENTINEL, BIOPSY:  -There is no evidence of carcinoma in 1 of 1 lymph node (0/1).   H. 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  - Ductal carcinoma in situ, intermediate grade.  - Perineural invasion is identified.  - The surgical resection margins are negative for carcinoma.  - See oncology table below   I. BREAST, RIGHT ADDITIONAL INFERIOR MARGIN, EXCISION:  - Fibrocystic changes.  - There is no evidence of malignancy.     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 Postoperative findings in the right breast and axilla. Upper normal sized right axillary lymph nodes, nonspecific. 2. 2 mm superior apical segment right upper lobe nodule is technically nonspecific although statistically likely to be benign. 3. 3.6 cm hypodense right adnexal structure, probably a cyst but technically nonspecific. If patient is premenopausal, no follow up is warranted; if early postmenopausal, follow up ultrasound in 6-12 months would be recommended. 4. Other imaging findings of potential clinical significance: Small type 1 hiatal hernia. Descending and sigmoid colon diverticulosis. Small indirect left inguinal hernia contains adipose tissue.   Aortic Atherosclerosis (ICD10-I70.0).   10/26/2019 Imaging   Whole Body Bone scan 10/26/19  IMPRESSION: Nonspecific sites of uptake at the RIGHT ischium and  anterior LEFT sixth rib, cannot exclude metastatic foci.   Otherwise negative exam.     10/29/2019 -  Chemotherapy   Adjuvant AC q2weeks for 4 cycles starting 10/29/19-12/21/19 followed by weekly Taxol for 12 weeks.          CURRENT THERAPY:  Adjuvant AC q2weeks for 4 cycles starting 10/29/19-12/21/19 followed by weekly Taxol for 12 weeks.    INTERVAL HISTORY:  Paula Davidson is here for a follow up and treatment. She presents to the clinic with family member. She notes 3 days after C3  she got nauseous without vomiting and no appetite along with weakness and fatigue. This lasted a week and started to subside but lingered longer. With phenergan and Ativan help resolve this. She notes she drinks adequately and also takes carnation instant breakfast and smoothies with protein given she is lactose intolerant. She notes she does eat high protein snack in between meals.     REVIEW OF SYSTEMS:   Constitutional: Denies fevers, chills (+) weight loss Eyes: Denies blurriness of vision Ears, nose, mouth, throat, and face: Denies mucositis or sore throat Respiratory: Denies cough, dyspnea or wheezes Cardiovascular: Denies palpitation, chest discomfort or lower extremity swelling Gastrointestinal:  Denies nausea, heartburn or change in bowel habits Skin: Denies abnormal skin rashes Lymphatics: Denies new lymphadenopathy or easy bruising Neurological:Denies numbness, tingling or new weaknesses Behavioral/Psych: Mood is stable, no new changes  All other systems were reviewed with the patient and are negative.  MEDICAL HISTORY:  Past Medical History:  Diagnosis Date   Anxiety    Cancer ([place]) 09/24/2019   right breast cancer-had lumpectomy   Depression    DUB (dysfunctional uterine bleeding)    Family history of pancreatic cancer    Hypertension     SURGICAL HISTORY: Past Surgical History:  Procedure Laterality Date   BREAST LUMPECTOMY WITH AXILLARY LYMPH NODE BIOPSY Right 09/24/2019   Procedure: RIGHT BREAST LUMPECTOMY WITH SENTINEL LYMPH NODE BIOPSY;  Surgeon: Jovita Kussmaul, MD;  Location: [facility];  Service: General;  Laterality: Right;   BREAST SURGERY  09/24/2019   Breast lump excised   FOOT SURGERY     LAPAROSCOPIC GASTRIC BANDING     PORTACATH PLACEMENT N/A 10/25/2019   Procedure: INSERTION PORT-A-CATH WITH POSSIBLE ULTRASOUND;  Surgeon: Jovita Kussmaul, MD;  Location: [facility];  Service: General;  Laterality: N/A;   WISDOM TOOTH EXTRACTION       I have reviewed the social history and family history with the patient and they are unchanged from previous note.  ALLERGIES:  is allergic to hydrochlorothiazide; codeine; and lisinopril.  MEDICATIONS:  Current Outpatient Medications  Medication Sig Dispense Refill   amitriptyline (ELAVIL) 25 MG tablet Take 25 mg by mouth at bedtime as needed for sleep.     amLODipine (NORVASC) 5 MG tablet Take 5 mg by mouth daily.     Bioflavonoid Products (ESTER C PO) Take 1 tablet by mouth daily.     Calcium Carbonate-Vitamin D (CALCIUM + D PO) Take 1 tablet by mouth daily.      cetirizine (ZYRTEC) 10 MG tablet Take 10 mg by mouth daily as needed for allergies.     cholecalciferol (VITAMIN D) 25 MCG (1000 UT) tablet Take 1,000 Units by mouth daily.     citalopram (CELEXA) 20 MG tablet Take 20 mg by mouth daily.     HYDROcodone-acetaminophen (NORCO/VICODIN) 5-325 MG tablet Take 1-2 tablets by mouth every 6 (six) hours as needed for moderate pain or severe  pain. 10 tablet 0   lidocaine-prilocaine (EMLA) cream Apply to affected area once 30 g 3   LORazepam (ATIVAN) 0.5 MG tablet Take 1-2 tablets (0.5-1 mg total) by mouth every 8 (eight) hours as needed for anxiety (nausea). 20 tablet 0   losartan (COZAAR) 100 MG tablet Take 100 mg by mouth daily.     Multiple Vitamin (MULTIVITAMIN WITH MINERALS) TABS tablet Take 1 tablet by mouth daily.     ondansetron (ZOFRAN) 8 MG tablet Take 1 tablet (8 mg total) by mouth 2 (two) times daily as needed. Start on the third day after chemotherapy. 30 tablet 1   prochlorperazine (COMPAZINE) 10 MG tablet Take 1 tablet (10 mg total) by mouth every 6 (six) hours as needed (Nausea or vomiting). 30 tablet 1   promethazine (PHENERGAN) 25 MG tablet Take 1 tablet (25 mg total) by mouth every 6 (six) hours as needed for nausea or vomiting. 30 tablet 0   No current facility-administered medications for this visit.   Facility-Administered Medications Ordered in  Other Visits  Medication Dose Route Frequency Provider Last Rate Last Admin   0.9 %  sodium chloride infusion   Intravenous Once Truitt Merle, MD       cyclophosphamide (CYTOXAN) 1,120 mg in sodium chloride 0.9 % 250 mL chemo infusion  450 mg/m2 (Order-Specific) Intravenous Once Truitt Merle, MD       DOXOrubicin (ADRIAMYCIN) chemo injection 112 mg  45 mg/m2 (Order-Specific) Intravenous Once Truitt Merle, MD       fosaprepitant (EMEND) 150 mg, dexamethasone (DECADRON) 12 mg in sodium chloride 0.9 % 145 mL IVPB   Intravenous Once Truitt Merle, MD       heparin lock flush 100 unit/mL  500 Units Intracatheter Once PRN Truitt Merle, MD       palonosetron (ALOXI) injection 0.25 mg  0.25 mg Intravenous Once Truitt Merle, MD       sodium chloride flush (NS) 0.9 % injection 10 mL  10 mL Intracatheter PRN Truitt Merle, MD        PHYSICAL EXAMINATION: ECOG PERFORMANCE STATUS: 2 - Symptomatic, <50% confined to bed  Vitals:   12/21/19 0818  BP: (!) 146/84  Pulse: 77  Resp: 17  Temp: 98.9 F (37.2 C)  SpO2: 100%   Filed Weights   12/21/19 0818  Weight: 274 lb 9.6 oz (124.6 kg)   Due to COVID19 we will limit examination to appearance. Patient had no complaints.  GENERAL:alert, no distress and comfortable SKIN: skin color normal, no rashes or significant lesions EYES: normal, Conjunctiva are pink and non-injected, sclera clear  NEURO: alert & oriented x 3 with fluent speech   LABORATORY DATA:  I have reviewed the data as listed CBC Latest Ref Rng & Units 12/21/2019 11/29/2019 11/12/2019  WBC 4.0 - 10.5 K/uL 4.1 8.0 10.8(H)  Hemoglobin 12.0 - 15.0 g/dL 10.0(L) 11.3(L) 11.5(L)  Hematocrit 36.0 - 46.0 % 31.7(L) 35.1(L) 35.8(L)  Platelets 150 - 400 K/uL 527(H) 212 263     CMP Latest Ref Rng & Units 12/21/2019 11/29/2019 11/12/2019  Glucose 70 - 99 mg/dL 76 106(H) 88  BUN 6 - 20 mg/dL _0 Creatinine 0.44 - 1.00 mg/dL 0.73 0.79 0.71  Sodium 135 - 145 mmol/L 142 139 139  Potassium 3.5 - 5.1 mmol/L 3.4(L)  3.7 3.9  Chloride 98 - 111 mmol/L 108 105 106  CO2 22 - 32 mmol/L _1 Calcium 8.9 - 10.3 mg/dL 9.2 9.0 9.1  Total Protein  6.5 - 8.1 g/dL 7.2 7.6 7.5  Total Bilirubin 0.3 - 1.2 mg/dL 0.3 <0.2(L) <0.2(L)  Alkaline Phos 38 - 126 U/L 50 70 83  AST 15 - 41 U/L 31 26 14(L)  ALT 0 - 44 U/L 57(H) 31 19      RADIOGRAPHIC STUDIES: I have personally reviewed the radiological images as listed and agreed with the findings in the report. No results found.   ASSESSMENT & PLAN:  Paula Davidson is a 49 y.o. female with    1Malignant neoplasm of upper-outer quadrant of right breast, StageIB, c(T2N0M0), ER/PR+, HER2-, Levester Fresh -She was diagnosed in 08/2019. 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  -She underwent right lumpectomy and SLNB with Dr. TMarlou Starkson 09/24/19.Her mammaprint showedHigh Risk Luminal Type B with 29% risk of recurrence in 10 years. -To reduce her high risk adjuvant chemoI started her on adjuvantIV chemoAC q2weeks for 4 cyclesstarting 12/14/20followed by weekly Taxol for 12 weeks.  --S/p C3 her side effects are starting to compound. She has lingering nausea, low appetite, weakness and fatigue. She was losing more weight until I gave her phenergan and ativan last week. She has recovered mostly now. She would like to proceed with last cycle AC today.  -Labs reviewed and adequate [facility] Will wait 2 weeks before proceeding with Taxol. I reviewed side effects with her, will watch for neuropathy. I recommend she use ice bags on hands and feet to reduce the risk of neuropathy. She is also interested in our neuropathy study, saw research nurse today -F/u in 2 weeks   2. Nausea, low appetite, weight loss,  weakness/fatigue  -Much improved with phenergan and ativan -I reviewed eating high protein high calorie meals and snack in between  -She can continue protein smoothies and carnation breakfasts -I also encouraged her to increase activity level at home with exercise.    3. Genetic Testingnegative for pathogenetic mutations -She does haveVUS of BRCA1 c.2048A>G  4. Menorrhagia  -Shehassevere menorrhagia with long term bleeding. Shepreviouslyrequired IV iron.Her Gyn started Mirena 5 years ago which stopped her periods.  -She had Mirena removed in 08/2019. Her period has not returned yet. -Given her age she may start menopause soon. Will test her hormonal level in the near futureif she has no menstrual perioddoes not return.  -She has h/o of ovarian cyst. She currently has one of right ovary as seen on 10/26/19 CT scan. Will f/u with Gyn. -She has not had period on chemo so far.    5. HTN, Depression, Anxiety, obesity -On Amlodipine, Losartan, Lopressorand Celexa -[place]   PLAN: -Labs reviewed and adequate to proceed with C4AC today with dose reduced by 25% due to poor tolerance last cycle -Lab, flush and Taxol in 2,3,4,5 weeks  -F/u in 2 and 3 weeks    No problem-specific Assessment & Plan notes found for this encounter.   No orders of the defined types were placed in this encounter.  Pt's family member had many questions. All questions were answered. The patient knows to call the clinic with any problems, questions or concerns. No barriers to learning was detected. The total time spent in the appointment was 30 minutes.     YKrista Blue  Burr Medico, MD 12/21/2019   I, Joslyn Devon, am acting as scribe for Truitt Merle, MD.   I have reviewed the above documentation for accuracy and completeness, and I agree with the above.

## 2019-12-21 ENCOUNTER — Inpatient Hospital Stay (HOSPITAL_BASED_OUTPATIENT_CLINIC_OR_DEPARTMENT_OTHER): Payer: BLUE CROSS/BLUE SHIELD | Admitting: Hematology

## 2019-12-21 ENCOUNTER — Inpatient Hospital Stay: Payer: BLUE CROSS/BLUE SHIELD

## 2019-12-21 ENCOUNTER — Encounter: Payer: Self-pay | Admitting: Hematology

## 2019-12-21 ENCOUNTER — Encounter: Payer: Self-pay | Admitting: *Deleted

## 2019-12-21 ENCOUNTER — Other Ambulatory Visit: Payer: Self-pay

## 2019-12-21 ENCOUNTER — Inpatient Hospital Stay: Payer: BLUE CROSS/BLUE SHIELD | Attending: Hematology

## 2019-12-21 ENCOUNTER — Telehealth: Payer: Self-pay | Admitting: Hematology

## 2019-12-21 VITALS — BP 146/84 | HR 77 | Temp 98.9°F | Resp 17 | Wt 274.6 lb

## 2019-12-21 DIAGNOSIS — F418 Other specified anxiety disorders: Secondary | ICD-10-CM | POA: Insufficient documentation

## 2019-12-21 DIAGNOSIS — T451X5A Adverse effect of antineoplastic and immunosuppressive drugs, initial encounter: Secondary | ICD-10-CM | POA: Insufficient documentation

## 2019-12-21 DIAGNOSIS — D6481 Anemia due to antineoplastic chemotherapy: Secondary | ICD-10-CM | POA: Diagnosis not present

## 2019-12-21 DIAGNOSIS — C50411 Malignant neoplasm of upper-outer quadrant of right female breast: Secondary | ICD-10-CM | POA: Diagnosis present

## 2019-12-21 DIAGNOSIS — I10 Essential (primary) hypertension: Secondary | ICD-10-CM | POA: Diagnosis not present

## 2019-12-21 DIAGNOSIS — N920 Excessive and frequent menstruation with regular cycle: Secondary | ICD-10-CM | POA: Diagnosis not present

## 2019-12-21 DIAGNOSIS — R531 Weakness: Secondary | ICD-10-CM | POA: Diagnosis not present

## 2019-12-21 DIAGNOSIS — Z170 Estrogen receptor positive status [ER+]: Secondary | ICD-10-CM | POA: Diagnosis not present

## 2019-12-21 DIAGNOSIS — R110 Nausea: Secondary | ICD-10-CM | POA: Insufficient documentation

## 2019-12-21 DIAGNOSIS — E669 Obesity, unspecified: Secondary | ICD-10-CM | POA: Diagnosis not present

## 2019-12-21 DIAGNOSIS — R5383 Other fatigue: Secondary | ICD-10-CM | POA: Insufficient documentation

## 2019-12-21 DIAGNOSIS — K449 Diaphragmatic hernia without obstruction or gangrene: Secondary | ICD-10-CM | POA: Diagnosis not present

## 2019-12-21 DIAGNOSIS — Z95828 Presence of other vascular implants and grafts: Secondary | ICD-10-CM

## 2019-12-21 DIAGNOSIS — I700 Atherosclerosis of aorta: Secondary | ICD-10-CM | POA: Insufficient documentation

## 2019-12-21 DIAGNOSIS — Z79899 Other long term (current) drug therapy: Secondary | ICD-10-CM | POA: Diagnosis not present

## 2019-12-21 LAB — CBC WITH DIFFERENTIAL (CANCER CENTER ONLY)
Abs Immature Granulocytes: 0.03 10*3/uL (ref 0.00–0.07)
Basophils Absolute: 0 10*3/uL (ref 0.0–0.1)
Basophils Relative: 1 %
Eosinophils Absolute: 0 10*3/uL (ref 0.0–0.5)
Eosinophils Relative: 1 %
HCT: 31.7 % — ABNORMAL LOW (ref 36.0–46.0)
Hemoglobin: 10 g/dL — ABNORMAL LOW (ref 12.0–15.0)
Immature Granulocytes: 1 %
Lymphocytes Relative: 31 %
Lymphs Abs: 1.3 10*3/uL (ref 0.7–4.0)
MCH: 25.9 pg — ABNORMAL LOW (ref 26.0–34.0)
MCHC: 31.5 g/dL (ref 30.0–36.0)
MCV: 82.1 fL (ref 80.0–100.0)
Monocytes Absolute: 1 10*3/uL (ref 0.1–1.0)
Monocytes Relative: 24 %
Neutro Abs: 1.8 10*3/uL (ref 1.7–7.7)
Neutrophils Relative %: 42 %
Platelet Count: 527 10*3/uL — ABNORMAL HIGH (ref 150–400)
RBC: 3.86 MIL/uL — ABNORMAL LOW (ref 3.87–5.11)
RDW: 17.3 % — ABNORMAL HIGH (ref 11.5–15.5)
WBC Count: 4.1 10*3/uL (ref 4.0–10.5)
nRBC: 0.5 % — ABNORMAL HIGH (ref 0.0–0.2)

## 2019-12-21 LAB — CMP (CANCER CENTER ONLY)
ALT: 57 U/L — ABNORMAL HIGH (ref 0–44)
AST: 31 U/L (ref 15–41)
Albumin: 3.7 g/dL (ref 3.5–5.0)
Alkaline Phosphatase: 50 U/L (ref 38–126)
Anion gap: 9 (ref 5–15)
BUN: 6 mg/dL (ref 6–20)
CO2: 25 mmol/L (ref 22–32)
Calcium: 9.2 mg/dL (ref 8.9–10.3)
Chloride: 108 mmol/L (ref 98–111)
Creatinine: 0.73 mg/dL (ref 0.44–1.00)
GFR, Est AFR Am: >60 mL/min (ref >60)
GFR, Estimated: >60 mL/min (ref >60)
Glucose, Bld: 76 mg/dL (ref 70–99)
Potassium: 3.4 mmol/L — ABNORMAL LOW (ref 3.5–5.1)
Sodium: 142 mmol/L (ref 135–145)
Total Bilirubin: 0.3 mg/dL (ref 0.3–1.2)
Total Protein: 7.2 g/dL (ref 6.5–8.1)

## 2019-12-21 MED ORDER — SODIUM CHLORIDE 0.9 % IV SOLN
Freq: Once | INTRAVENOUS | Status: AC
  Filled 2019-12-21: qty 250

## 2019-12-21 MED ORDER — HEPARIN SOD (PORK) LOCK FLUSH 100 UNIT/ML IV SOLN
500.0000 [IU] | Freq: Once | INTRAVENOUS | Status: AC | PRN
  Administered 2019-12-21: 12:00:00 500 [IU]
  Filled 2019-12-21: qty 5

## 2019-12-21 MED ORDER — PALONOSETRON HCL INJECTION 0.25 MG/5ML
0.2500 mg | Freq: Once | INTRAVENOUS | Status: AC
  Administered 2019-12-21: 0.25 mg via INTRAVENOUS

## 2019-12-21 MED ORDER — DOXORUBICIN HCL CHEMO IV INJECTION 2 MG/ML
45.0000 mg/m2 | Freq: Once | INTRAVENOUS | Status: AC
  Administered 2019-12-21: 112 mg via INTRAVENOUS
  Filled 2019-12-21: qty 56

## 2019-12-21 MED ORDER — PALONOSETRON HCL INJECTION 0.25 MG/5ML
INTRAVENOUS | Status: AC
  Filled 2019-12-21: qty 5

## 2019-12-21 MED ORDER — SODIUM CHLORIDE 0.9% FLUSH
10.0000 mL | INTRAVENOUS | Status: DC | PRN
  Administered 2019-12-21: 10 mL
  Filled 2019-12-21: qty 10

## 2019-12-21 MED ORDER — SODIUM CHLORIDE 0.9 % IV SOLN
450.0000 mg/m2 | Freq: Once | INTRAVENOUS | Status: AC
  Administered 2019-12-21: 1120 mg via INTRAVENOUS
  Filled 2019-12-21: qty 56

## 2019-12-21 MED ORDER — SODIUM CHLORIDE 0.9 % IV SOLN
Freq: Once | INTRAVENOUS | Status: AC
  Filled 2019-12-21: qty 5

## 2019-12-21 MED ORDER — SODIUM CHLORIDE 0.9% FLUSH
10.0000 mL | INTRAVENOUS | Status: DC | PRN
  Administered 2019-12-21: 10 mL via INTRAVENOUS
  Filled 2019-12-21: qty 10

## 2019-12-21 NOTE — Research (Signed)
[id number], A PROSPECTIVE OBSERVATIONAL COHORT STUDY TO DEVELOP A PREDICTIVE MODEL OF TAXANE-INDUCED PERIPHERAL NEUROPATHY IN CANCER PATIENTS. Dr. Burr Medico referred patient for the above study. Met with patient and her mother in exam room for 10 minutes.  Briefly explained the purpose of the study along with potential risks and benefits of participation.  Reviewed the study required assessments and timeline for completing these assessments. Informed patient that participation is completely voluntary.  Provided patient with copy of consents and hippa forms for this study.  Offered to review consent form with patient during our visit. Patient stated she would like to review it at home. Patient agreed that it is ok for a research nurse to follow up with her by phone call next week. Patient is scheduled to start paclitaxel in 2 weeks.  Informed patient if she is interested in enrolling on this study, she would need to meet with research nurse for about 20 to 30 minutes to review and sign consent before any study procedures.  Gave patient a clinical trials informational brochure and business card for my co-worker, Otilio Miu, who will follow up with patient next week. Encouraged patient to call research nurse if any questions prior to next visit.  Patient verbalized understanding and denied any questions at this time.  Thanked patient for her time and willingness to discuss the study.  Foye Spurling, BSN, RN Clinical Research Nurse 12/21/2019 9:39 AM

## 2019-12-21 NOTE — Telephone Encounter (Signed)
Scheduled appt per 2/5 los.  Patient will get an updated appt calendar at their appt tomorrow 2/6.

## 2019-12-21 NOTE — Patient Instructions (Signed)
[facility] Discharge Instructions for Patients Receiving Chemotherapy  Today you received the following chemotherapy agents: Adriamycin, Cytoxan  To help prevent nausea and vomiting after your treatment, we encourage you to take your nausea medication as directed.   If you develop nausea and vomiting that is not controlled by your nausea medication, call the clinic.   BELOW ARE SYMPTOMS THAT SHOULD BE REPORTED IMMEDIATELY:  *FEVER GREATER THAN 100.5 F  *CHILLS WITH OR WITHOUT FEVER  NAUSEA AND VOMITING THAT IS NOT CONTROLLED WITH YOUR NAUSEA MEDICATION  *UNUSUAL SHORTNESS OF BREATH  *UNUSUAL BRUISING OR BLEEDING  TENDERNESS IN MOUTH AND THROAT WITH OR WITHOUT PRESENCE OF ULCERS  *URINARY PROBLEMS  *BOWEL PROBLEMS  UNUSUAL RASH Items with * indicate a potential emergency and should be followed up as soon as possible.  Feel free to call the clinic should you have any questions or concerns. The clinic phone number is [phone].  Please show the [place] at check-in to the Emergency Department and triage nurse.

## 2019-12-22 ENCOUNTER — Inpatient Hospital Stay: Payer: BLUE CROSS/BLUE SHIELD

## 2019-12-22 ENCOUNTER — Other Ambulatory Visit: Payer: Self-pay

## 2019-12-22 VITALS — BP 135/73 | HR 79 | Temp 98.7°F | Resp 18

## 2019-12-22 DIAGNOSIS — C50411 Malignant neoplasm of upper-outer quadrant of right female breast: Secondary | ICD-10-CM | POA: Diagnosis not present

## 2019-12-22 MED ORDER — PEGFILGRASTIM-JMDB 6 MG/0.6ML ~~LOC~~ SOSY
6.0000 mg | PREFILLED_SYRINGE | Freq: Once | SUBCUTANEOUS | Status: AC
  Administered 2019-12-22: 12:00:00 6 mg via SUBCUTANEOUS

## 2019-12-22 NOTE — Patient Instructions (Signed)

## 2019-12-24 ENCOUNTER — Inpatient Hospital Stay: Payer: BLUE CROSS/BLUE SHIELD

## 2019-12-26 ENCOUNTER — Telehealth: Payer: Self-pay | Admitting: Medical Oncology

## 2019-12-26 ENCOUNTER — Encounter: Payer: Self-pay | Admitting: *Deleted

## 2019-12-26 NOTE — Telephone Encounter (Signed)
[id number]: Patient referred to study by Dr. Burr Medico.  LVMOM with patient introducing myself. Informed patient I was calling to see if she had any questions regarding the study or the information that was provided to her last week. Patient thanked and encouraged to call back. My call back information provided.  Maxwell [name], RN, BSN, [facility] Clinical Research 12/26/2019 1:02 PM

## 2019-12-27 ENCOUNTER — Inpatient Hospital Stay: Payer: BLUE CROSS/BLUE SHIELD

## 2019-12-27 ENCOUNTER — Inpatient Hospital Stay: Payer: BLUE CROSS/BLUE SHIELD | Admitting: Hematology

## 2019-12-31 ENCOUNTER — Telehealth: Payer: Self-pay | Admitting: Medical Oncology

## 2019-12-31 NOTE — Telephone Encounter (Signed)
[id number]: Referral  LVMOM with patient regarding study. Call back number provided.

## 2020-01-04 ENCOUNTER — Inpatient Hospital Stay: Payer: BLUE CROSS/BLUE SHIELD

## 2020-01-04 ENCOUNTER — Inpatient Hospital Stay: Payer: BLUE CROSS/BLUE SHIELD | Admitting: Hematology

## 2020-01-09 NOTE — Progress Notes (Signed)
[place]   [phone] [phone]   Clinic Follow up Note   Patient Care Team: Paula Sheng, MD as PCP - General (Family Medicine) Paula Kaufmann, RN as Oncology Nurse Navigator Paula Germany, RN as Oncology Nurse Navigator Paula Kussmaul, MD as Consulting Physician (General Surgery) Paula Merle, MD as Consulting Physician (Hematology) Paula Gibson, MD as Attending Physician (Radiation Oncology)  Date of Service:  01/11/2020  CHIEF COMPLAINT: F/u of right breast cancer  SUMMARY OF ONCOLOGIC HISTORY: Oncology History Overview Note  Cancer Staging Malignant neoplasm of upper-outer quadrant of right breast in female, estrogen receptor positive ([place]) Staging form: Breast, AJCC 8th Edition - Clinical stage from 08/28/2019: Stage IB (cT2, cN0, cM0, G2, ER+, PR+, HER2-) - Signed by Paula Merle, MD on 09/04/2019 - Pathologic stage from 09/24/2019: Stage IB (pT2, pN1a, cM0, G2, ER+, PR+, HER2-) - Signed by Paula Merle, MD on 10/10/2019    Malignant neoplasm of upper-outer quadrant of right breast in female, estrogen receptor positive ([place])  08/24/2019 Mammogram   Diagnostic mammogram and [place] 08/24/19 IMPRESSION: Suspicious right breast mass 10 o'clock 1 cm from the nipple measuring 3.3 x 1.9 x 1.9 cm and axillary adenopathy.   08/28/2019 Initial Biopsy   Diagnosis 08/28/19 1. Breast, right, needle core biopsy, 10 o'clock - INVASIVE MAMMARY CARCINOMA, GRADE II. - SEE MICROSCOPIC DESCRIPTION. 2. Lymph node, needle/core biopsy, right axilla - BENIGN LYMPH NODE. - NO METASTATIC CARCINOMA IDENTIFIED.    08/28/2019 Receptors her2   Results: IMMUNOHISTOCHEMICAL AND MORPHOMETRIC ANALYSIS PERFORMED MANUALLY The tumor cells are NEGATIVE for Her2 (1+). Estrogen Receptor: 100%, POSITIVE, STRONG STAINING INTENSITY Progesterone Receptor: 100%, POSITIVE, STRONG STAINING INTENSITY Proliferation Marker Ki67: 10%   08/28/2019 Cancer Staging   Staging  form: Breast, AJCC 8th Edition - Clinical stage from 08/28/2019: Stage IB (cT2, cN0, cM0, G2, ER+, PR+, HER2-) - Signed by Paula Merle, MD on 09/04/2019   08/31/2019 Initial Diagnosis   Malignant neoplasm of upper-outer quadrant of right breast in female, estrogen receptor positive ([place])   09/07/2019 Breast MRI   Breast MRI 09/07/19  IMPRESSION: 1. 2.5 cm known malignancy in the anterior right breast, superficial depth near the nipple. 2. 7-8 mm indeterminate mass in the central upper outer quadrant of the right breast. 3. One right axillary lymph node with apparent mild cortical thickening, which lies slightly anterior and inferior to the biopsied right axillary lymph node ( benign reactive on pathology). This is most likely also benign given the pathology from the adjacent biopsied lymph node. 4. No evidence of left breast malignancy.   09/17/2019 Imaging   [place] right breast 09/17/19  IMPRESSION: Several benign appearing masses in the upper outer quadrant of the right breast without a definite correlate to the finding seen on prior MRI. The right axillary lymph nodes appear similar to the recently biopsied benign lymph node.   09/20/2019 Pathology Results   Diagnosis 09/20/19  Breast, right, needle core biopsy, upper outer quadrant - FIBROADENOMA - NO MALIGNANCY IDENTIFIED    09/21/2019 Genetic Testing   Negative genetic testing:  No pathogenic variants detected on the [facility] Breast Cancer STAT panel and Common Hereditary Cancers panel.  A variant of uncertain significance was identified in the BRCA1 gene, called c.2048A>G ([id number]).  The report date is 09/21/2019.  The STAT Breast cancer panel offered by [facility] includes sequencing and rearrangement analysis for the following 9 genes:  ATM, BRCA1, BRCA2, CDH1, CHEK2, PALB2, PTEN, STK11 and TP53.   The Common  Hereditary Cancers Panel offered by [facility] includes sequencing and/or deletion duplication testing of the following 48  genes: APC, ATM, AXIN2, BARD1, BMPR1A, BRCA1, BRCA2, BRIP1, CDH1, CDK4, CDKN2A (p14ARF), CDKN2A (p16INK4a), CHEK2, CTNNA1, DICER1, EPCAM (Deletion/duplication testing only), GREM1 (promoter region deletion/duplication testing only), KIT, MEN1, MLH1, MSH2, MSH3, MSH6, MUTYH, NBN, NF1, NHTL1, PALB2, PDGFRA, PMS2, POLD1, POLE, PTEN, RAD50, RAD51C, RAD51D, RNF43, SDHB, SDHC, SDHD, SMAD4, SMARCA4. STK11, TP53, TSC1, TSC2, and VHL.  The following genes were evaluated for sequence changes only: SDHA and HOXB13 c.251G>A variant only.    09/24/2019 Surgery   RIGHT BREAST LUMPECTOMY WITH SENTINEL LYMPH NODE BIOPSY by Dr Toth  09/24/19    09/24/2019 Pathology Results   FINAL MICROSCOPIC DIAGNOSIS: 09/24/19   A. LYMPH NODE, RIGHT #1, SENTINEL, BIOPSY:  - There is no evidence of carcinoma in 1 of 1 lymph node (0/1).   B. LYMPH NODE, RIGHT, SENTINEL, BIOPSY:  - There is no evidence of carcinoma in 1 of 1 lymph node (0/1).   C. 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   D. LYMPH NODE, RIGHT #2, SENTINEL, BIOPSY:  - There is no evidence of carcinoma in 1 of 1 lymph node (0/1).   E. LYMPH NODE, RIGHT #3 , SENTINEL, BIOPSY:  - There is no evidence of carcinoma in 1 of 1 lymph node (0/1).   F. LYMPH NODE, RIGHT, SENTINEL, BIOPSY:  - There is no evidence of carcinoma in 1 of 1 lymph node (0/1).   G. LYMPH NODE, RIGHT, SENTINEL, BIOPSY:  -There is no evidence of carcinoma in 1 of 1 lymph node (0/1).   H. 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  - Ductal carcinoma in situ, intermediate grade.  - Perineural invasion is identified.  - The surgical resection margins are negative for carcinoma.  - See oncology table below   I. BREAST, RIGHT ADDITIONAL INFERIOR MARGIN, EXCISION:  - Fibrocystic changes.  - There is no evidence of malignancy.     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 Postoperative findings in the right breast and axilla. Upper normal sized right axillary lymph nodes, nonspecific. 2. 2 mm superior apical segment right upper lobe nodule is technically nonspecific although statistically likely to be benign. 3. 3.6 cm hypodense right adnexal structure, probably a cyst but technically nonspecific. If patient is premenopausal, no follow up is warranted; if early postmenopausal, follow up ultrasound in 6-12 months would be recommended. 4. Other imaging findings of potential clinical significance: Small type 1 hiatal hernia. Descending and sigmoid colon diverticulosis. Small indirect left inguinal hernia contains adipose tissue.   Aortic Atherosclerosis (ICD10-I70.0).   10/26/2019 Imaging   Whole Body Bone scan 10/26/19  IMPRESSION: Nonspecific sites of uptake at the RIGHT ischium and anterior   LEFT sixth rib, cannot exclude metastatic foci.   Otherwise negative exam.     10/29/2019 -  Chemotherapy   Adjuvant AC q2weeks for 4 cycles starting 10/29/19-12/21/19 followed by weekly Taxol for 12 weeks starting 01/11/20.       CURRENT THERAPY:  Adjuvant AC q2weeks for 4 cycles starting 10/29/19-12/21/19 followed by weekly Taxol for 12 weeks starting 01/11/20.   INTERVAL HISTORY:  Paula Davidson is here for a follow up and treatment. She presents to the clinic alone.  She notes her extra week off chemo went well and she feels better this week. She denies current nausea or diarrhea. Her energy has much improved. She has started being more active and still working from home. She notes she completely shaved her hair off due to the hair loss.     REVIEW OF SYSTEMS:   Constitutional: Denies fevers, chills or abnormal weight loss Eyes: Denies blurriness of vision Ears, nose, mouth, throat, and face: Denies mucositis or sore throat Respiratory: Denies cough, dyspnea or wheezes Cardiovascular: Denies palpitation, chest discomfort or lower extremity swelling Gastrointestinal:  Denies nausea, heartburn or change in bowel habits Skin: Denies abnormal skin rashes Lymphatics: Denies new lymphadenopathy or easy bruising Neurological:Denies numbness, tingling or new weaknesses Behavioral/Psych: Mood is stable, no new changes  All other systems were reviewed with the patient and are negative.  MEDICAL HISTORY:  Past Medical History:  Diagnosis Date  . Anxiety   . Cancer ([place]) 09/24/2019   right breast cancer-had lumpectomy  . Depression   . DUB (dysfunctional uterine bleeding)   . Family history of pancreatic cancer   . Hypertension     SURGICAL HISTORY: Past Surgical History:  Procedure Laterality Date  . BREAST LUMPECTOMY WITH AXILLARY LYMPH NODE BIOPSY Right 09/24/2019   Procedure: RIGHT BREAST LUMPECTOMY WITH SENTINEL LYMPH NODE BIOPSY;  Surgeon: Paula Kussmaul, MD;  Location: [facility];  Service: General;  Laterality: Right;  . BREAST SURGERY  09/24/2019   Breast lump excised  . FOOT SURGERY    . LAPAROSCOPIC GASTRIC BANDING    . PORTACATH PLACEMENT N/A 10/25/2019   Procedure: INSERTION PORT-A-CATH WITH POSSIBLE ULTRASOUND;  Surgeon: Paula Kussmaul, MD;  Location: [facility];  Service: General;  Laterality: N/A;  . WISDOM TOOTH EXTRACTION      I have reviewed the social history and family history with the patient and they are unchanged  from previous note.  ALLERGIES:  is allergic to hydrochlorothiazide; codeine; and lisinopril.  MEDICATIONS:  Current Outpatient Medications  Medication Sig Dispense Refill  . amitriptyline (ELAVIL) 25 MG tablet Take 25 mg by mouth at bedtime as needed for sleep.    Marland Kitchen amLODipine (NORVASC) 5 MG tablet Take 5 mg by mouth daily.    Marland Kitchen Bioflavonoid Products (ESTER C PO) Take 1 tablet by mouth daily.    . Calcium Carbonate-Vitamin D (CALCIUM + D PO) Take 1 tablet by mouth daily.     . cetirizine (ZYRTEC) 10 MG tablet Take 10 mg by mouth daily as needed for allergies.    . cholecalciferol (VITAMIN D) 25 MCG (1000 UT) tablet Take 1,000 Units by mouth daily.    . citalopram (CELEXA) 20 MG tablet Take 20 mg by mouth daily.    Marland Kitchen HYDROcodone-acetaminophen (NORCO/VICODIN) 5-325 MG tablet Take 1-2 tablets by mouth every 6 (six) hours as needed for moderate pain or severe pain. 10 tablet 0  . lidocaine-prilocaine (EMLA) cream Apply to affected area once 30 g 3  . LORazepam (  ATIVAN) 0.5 MG tablet Take 1-2 tablets (0.5-1 mg total) by mouth every 8 (eight) hours as needed for anxiety (nausea). 20 tablet 0  . losartan (COZAAR) 100 MG tablet Take 100 mg by mouth daily.    . Multiple Vitamin (MULTIVITAMIN WITH MINERALS) TABS tablet Take 1 tablet by mouth daily.    . ondansetron (ZOFRAN) 8 MG tablet Take 1 tablet (8 mg total) by mouth 2 (two) times daily as needed. Start on the third day after chemotherapy. 30 tablet 1  . prochlorperazine (COMPAZINE) 10 MG tablet Take 1 tablet (10 mg total) by mouth every 6 (six) hours as needed (Nausea or vomiting). 30 tablet 1  . promethazine (PHENERGAN) 25 MG tablet Take 1 tablet (25 mg total) by mouth every 6 (six) hours as needed for nausea or vomiting. 30 tablet 0   No current facility-administered medications for this visit.    PHYSICAL EXAMINATION: ECOG PERFORMANCE STATUS: 1 - Symptomatic but completely ambulatory  Vitals:   01/11/20 1015  BP: (!) 141/84  Pulse: 78   Resp: 16  Temp: 98.3 F (36.8 C)  SpO2: 100%   Filed Weights   01/11/20 1015  Weight: 278 lb 8 oz (126.3 kg)    GENERAL:alert, no distress and comfortable SKIN: skin color, texture, turgor are normal, no rashes or significant lesions EYES: normal, Conjunctiva are pink and non-injected, sclera clear  NECK: supple, thyroid normal size, non-tender, without nodularity LYMPH:  no palpable lymphadenopathy in the cervical, axillary  LUNGS: clear to auscultation and percussion with normal breathing effort HEART: regular rate & rhythm and no murmurs and no lower extremity edema ABDOMEN:abdomen soft, non-tender and normal bowel sounds Musculoskeletal:no cyanosis of digits and no clubbing  NEURO: alert & oriented x 3 with fluent speech, no focal motor/sensory deficits BREAST: s/p right lumpectomy: Surgical incision healed well with hard tissue in 11:00 position. No palpable mass, nodules or adenopathy bilaterally. Breast exam benign.   LABORATORY DATA:  I have reviewed the data as listed CBC Latest Ref Rng & Units 01/11/2020 12/21/2019 11/29/2019  WBC 4.0 - 10.5 K/uL 5.7 4.1 8.0  Hemoglobin 12.0 - 15.0 g/dL 9.1(L) 10.0(L) 11.3(L)  Hematocrit 36.0 - 46.0 % 29.2(L) 31.7(L) 35.1(L)  Platelets 150 - 400 K/uL 413(H) 527(H) 212     CMP Latest Ref Rng & Units 01/11/2020 12/21/2019 11/29/2019  Glucose 70 - 99 mg/dL 104(H) 76 106(H)  BUN 6 - 20 mg/dL '7 6 8  '$ Creatinine 0.44 - 1.00 mg/dL 0.73 0.73 0.79  Sodium 135 - 145 mmol/L 140 142 139  Potassium 3.5 - 5.1 mmol/L 4.1 3.4(L) 3.7  Chloride 98 - 111 mmol/L 107 108 105  CO2 22 - 32 mmol/L '25 25 23  '$ Calcium 8.9 - 10.3 mg/dL 9.3 9.2 9.0  Total Protein 6.5 - 8.1 g/dL 7.3 7.2 7.6  Total Bilirubin 0.3 - 1.2 mg/dL 0.3 0.3 <0.2(L)  Alkaline Phos 38 - 126 U/L 57 50 70  AST 15 - 41 U/L '16 31 26  '$ ALT 0 - 44 U/L 26 57(H) 31      RADIOGRAPHIC STUDIES: I have personally reviewed the radiological images as listed and agreed with the findings in the  report. No results found.   ASSESSMENT & PLAN:  Paula Davidson is a 49 y.o. 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  -She underwent right lumpectomy and SLNB with Dr. TMarlou Starkson 09/24/19.Her mammaprint showedHigh Risk Luminal Type B with 29% risk of recurrence in 10 years. -To reduce her high risk adjuvant chemoI started her on adjuvantIV chemoAC q2weeks for 4 cycles12/14/20-2/5/21with C4 at 25% followed by weekly Taxol for 12 weeks starting 01/11/20.  -She has recovered overall from chemo AC with no more nausea, diarrhea and recovered energy. She will started her weekly Taxol today. I reviewed side effects mainly neuropathy. I recommend use ice bags during infusion, she is agreeable. [facility] She is open to more information but not sure if she is interested in proceeding with study.  -I also mentioned Taxol has risk for allergic reaction. Will monitor and proceed with steroid premedication.  -Physical exam benign. Labs reviewed, with anemia. Overall adequate to proceed with week 1 Taxol today.  -She has participated in our observational UPBEAT clinical trail, no AEs. -F/u in 1 week for toxicity check then every 2 weeks.    2. Genetic Testingnegative for pathogenetic mutations -She does haveVUS of BRCA1 c.2048A>G  3. Menorrhagia  -Shehassevere menorrhagia with long term bleeding. Shepreviouslyrequired IV iron.Her Gyn started Mirena 5 years ago which stopped her periods.  -She had Mirena removed in 08/2019. Her period has not  returned yet. -Given her age she may start menopause soon. Will test her hormonal level in the near futureif she has no menstrual perioddoes not return.  -She has h/o of ovarian cyst. She currently has one of right ovary as seen on 10/26/19 CT scan. Will f/u with Gyn. -She has not had period on chemo so far.   4. HTN, Depression, Anxiety, obesity -On Amlodipine, Losartan, Lopressorand Celexa -[place]  5. Anemia  -Has been secondary to menorrhagia in the past, now secondary to chemo  -Has been slowly trending down. Hg at 9.1 today (01/11/20), no need blood transfusion now    PLAN: -Labs reviewed and adequate to proceed with week 1 Taxol today, she will do cryotherapy  -Lab, flush, chemo Taxol in 1, 2, 3 weeks  -F/u in 1 and 3 weeks    No problem-specific Assessment & Plan notes found for this encounter.   No orders of the defined types were placed in this encounter.  All questions were answered. The patient knows to call the clinic with any problems, questions or concerns. No barriers to learning was detected. The total time spent in the appointment was 30 minutes.     YTruitt Merle MD 01/11/2020   I, AJoslyn Devon am acting as scribe for YTruitt Merle MD.   I have reviewed the above documentation for accuracy and completeness, and I agree with the above.

## 2020-01-11 ENCOUNTER — Inpatient Hospital Stay: Payer: BLUE CROSS/BLUE SHIELD

## 2020-01-11 ENCOUNTER — Inpatient Hospital Stay (HOSPITAL_BASED_OUTPATIENT_CLINIC_OR_DEPARTMENT_OTHER): Payer: BLUE CROSS/BLUE SHIELD | Admitting: Hematology

## 2020-01-11 ENCOUNTER — Encounter: Payer: Self-pay | Admitting: Hematology

## 2020-01-11 ENCOUNTER — Other Ambulatory Visit: Payer: Self-pay

## 2020-01-11 VITALS — BP 130/77 | HR 75 | Temp 98.3°F | Resp 18

## 2020-01-11 VITALS — BP 141/84 | HR 78 | Temp 98.3°F | Resp 16 | Ht 66.5 in | Wt 278.5 lb

## 2020-01-11 DIAGNOSIS — C50411 Malignant neoplasm of upper-outer quadrant of right female breast: Secondary | ICD-10-CM

## 2020-01-11 DIAGNOSIS — Z95828 Presence of other vascular implants and grafts: Secondary | ICD-10-CM

## 2020-01-11 DIAGNOSIS — I10 Essential (primary) hypertension: Secondary | ICD-10-CM

## 2020-01-11 DIAGNOSIS — Z170 Estrogen receptor positive status [ER+]: Secondary | ICD-10-CM

## 2020-01-11 LAB — CMP (CANCER CENTER ONLY)
ALT: 26 U/L (ref 0–44)
AST: 16 U/L (ref 15–41)
Albumin: 3.6 g/dL (ref 3.5–5.0)
Alkaline Phosphatase: 57 U/L (ref 38–126)
Anion gap: 8 (ref 5–15)
BUN: 7 mg/dL (ref 6–20)
CO2: 25 mmol/L (ref 22–32)
Calcium: 9.3 mg/dL (ref 8.9–10.3)
Chloride: 107 mmol/L (ref 98–111)
Creatinine: 0.73 mg/dL (ref 0.44–1.00)
GFR, Est AFR Am: >60 mL/min (ref >60)
GFR, Estimated: >60 mL/min (ref >60)
Glucose, Bld: 104 mg/dL — ABNORMAL HIGH (ref 70–99)
Potassium: 4.1 mmol/L (ref 3.5–5.1)
Sodium: 140 mmol/L (ref 135–145)
Total Bilirubin: 0.3 mg/dL (ref 0.3–1.2)
Total Protein: 7.3 g/dL (ref 6.5–8.1)

## 2020-01-11 LAB — CBC WITH DIFFERENTIAL (CANCER CENTER ONLY)
Abs Immature Granulocytes: 0.02 10*3/uL (ref 0.00–0.07)
Basophils Absolute: 0 10*3/uL (ref 0.0–0.1)
Basophils Relative: 1 %
Eosinophils Absolute: 0.2 10*3/uL (ref 0.0–0.5)
Eosinophils Relative: 3 %
HCT: 29.2 % — ABNORMAL LOW (ref 36.0–46.0)
Hemoglobin: 9.1 g/dL — ABNORMAL LOW (ref 12.0–15.0)
Immature Granulocytes: 0 %
Lymphocytes Relative: 20 %
Lymphs Abs: 1.2 10*3/uL (ref 0.7–4.0)
MCH: 26.5 pg (ref 26.0–34.0)
MCHC: 31.2 g/dL (ref 30.0–36.0)
MCV: 85.1 fL (ref 80.0–100.0)
Monocytes Absolute: 0.8 10*3/uL (ref 0.1–1.0)
Monocytes Relative: 14 %
Neutro Abs: 3.5 10*3/uL (ref 1.7–7.7)
Neutrophils Relative %: 62 %
Platelet Count: 413 10*3/uL — ABNORMAL HIGH (ref 150–400)
RBC: 3.43 MIL/uL — ABNORMAL LOW (ref 3.87–5.11)
RDW: 19.5 % — ABNORMAL HIGH (ref 11.5–15.5)
WBC Count: 5.7 10*3/uL (ref 4.0–10.5)
nRBC: 0 % (ref 0.0–0.2)

## 2020-01-11 MED ORDER — SODIUM CHLORIDE 0.9 % IV SOLN: 10.0000 mg | Freq: Once | INTRAVENOUS | Status: DC

## 2020-01-11 MED ORDER — SODIUM CHLORIDE 0.9% FLUSH
10.0000 mL | INTRAVENOUS | Status: DC | PRN
  Administered 2020-01-11: 10:00:00 10 mL via INTRAVENOUS
  Filled 2020-01-11: qty 10

## 2020-01-11 MED ORDER — DEXAMETHASONE SODIUM PHOSPHATE 10 MG/ML IJ SOLN
INTRAMUSCULAR | Status: AC
  Filled 2020-01-11: qty 1

## 2020-01-11 MED ORDER — HEPARIN SOD (PORK) LOCK FLUSH 100 UNIT/ML IV SOLN
500.0000 [IU] | Freq: Once | INTRAVENOUS | Status: AC | PRN
  Administered 2020-01-11: 500 [IU]
  Filled 2020-01-11: qty 5

## 2020-01-11 MED ORDER — SODIUM CHLORIDE 0.9 % IV SOLN
Freq: Once | INTRAVENOUS | Status: AC
  Filled 2020-01-11: qty 250

## 2020-01-11 MED ORDER — FAMOTIDINE IN NACL 20-0.9 MG/50ML-% IV SOLN
INTRAVENOUS | Status: AC
  Filled 2020-01-11: qty 50

## 2020-01-11 MED ORDER — SODIUM CHLORIDE 0.9% FLUSH
10.0000 mL | INTRAVENOUS | Status: DC | PRN
  Administered 2020-01-11: 14:00:00 10 mL
  Filled 2020-01-11: qty 10

## 2020-01-11 MED ORDER — FAMOTIDINE IN NACL 20-0.9 MG/50ML-% IV SOLN
20.0000 mg | Freq: Once | INTRAVENOUS | Status: AC
  Administered 2020-01-11: 20 mg via INTRAVENOUS

## 2020-01-11 MED ORDER — DIPHENHYDRAMINE HCL 50 MG/ML IJ SOLN
INTRAMUSCULAR | Status: AC
  Filled 2020-01-11: qty 1

## 2020-01-11 MED ORDER — DIPHENHYDRAMINE HCL 50 MG/ML IJ SOLN
25.0000 mg | Freq: Once | INTRAMUSCULAR | Status: AC
  Administered 2020-01-11: 25 mg via INTRAVENOUS

## 2020-01-11 MED ORDER — DEXAMETHASONE SODIUM PHOSPHATE 10 MG/ML IJ SOLN
10.0000 mg | Freq: Once | INTRAMUSCULAR | Status: AC
  Administered 2020-01-11: 10 mg via INTRAVENOUS

## 2020-01-11 MED ORDER — SODIUM CHLORIDE 0.9 % IV SOLN
80.0000 mg/m2 | Freq: Once | INTRAVENOUS | Status: AC
  Administered 2020-01-11: 198 mg via INTRAVENOUS
  Filled 2020-01-11: qty 33

## 2020-01-11 NOTE — Patient Instructions (Signed)
[facility] Discharge Instructions for Patients Receiving Chemotherapy  Today you received the following chemotherapy agents Taxol  To help prevent nausea and vomiting after your treatment, we encourage you to take your nausea medication as directed   If you develop nausea and vomiting that is not controlled by your nausea medication, call the clinic.   BELOW ARE SYMPTOMS THAT SHOULD BE REPORTED IMMEDIATELY:  *FEVER GREATER THAN 100.5 F  *CHILLS WITH OR WITHOUT FEVER  NAUSEA AND VOMITING THAT IS NOT CONTROLLED WITH YOUR NAUSEA MEDICATION  *UNUSUAL SHORTNESS OF BREATH  *UNUSUAL BRUISING OR BLEEDING  TENDERNESS IN MOUTH AND THROAT WITH OR WITHOUT PRESENCE OF ULCERS  *URINARY PROBLEMS  *BOWEL PROBLEMS  UNUSUAL RASH Items with * indicate a potential emergency and should be followed up as soon as possible.  Feel free to call the clinic should you have any questions or concerns. The clinic phone number is [phone].  Please show the [facility] at check-in to the Emergency Department and triage nurse.  Paclitaxel injection What is this medicine? PACLITAXEL (PAK li TAX el) is a chemotherapy drug. It targets fast dividing cells, like cancer cells, and causes these cells to die. This medicine is used to treat ovarian cancer, breast cancer, lung cancer, Kaposi's sarcoma, and other cancers. This medicine may be used for other purposes; ask your health care provider or pharmacist if you have questions. COMMON BRAND NAME(S): Onxol, Taxol What should I tell my health care provider before I take this medicine? They need to know if you have any of these conditions:  history of irregular heartbeat  liver disease  low blood counts, like low white cell, platelet, or red cell counts  lung or breathing disease, like asthma  tingling of the fingers or toes, or other nerve disorder  an unusual or allergic reaction to paclitaxel, alcohol, polyoxyethylated castor  oil, other chemotherapy, other medicines, foods, dyes, or preservatives  pregnant or trying to get pregnant  breast-feeding How should I use this medicine? This drug is given as an infusion into a vein. It is administered in a hospital or clinic by a specially trained health care professional. Talk to your pediatrician regarding the use of this medicine in children. Special care may be needed. Overdosage: If you think you have taken too much of this medicine contact a poison control center or emergency room at once. NOTE: This medicine is only for you. Do not share this medicine with others. What if I miss a dose? It is important not to miss your dose. Call your doctor or health care professional if you are unable to keep an appointment. What may interact with this medicine? Do not take this medicine with any of the following medications:  disulfiram  metronidazole This medicine may also interact with the following medications:  antiviral medicines for hepatitis, HIV or AIDS  certain antibiotics like erythromycin and clarithromycin  certain medicines for fungal infections like ketoconazole and itraconazole  certain medicines for seizures like carbamazepine, phenobarbital, phenytoin  gemfibrozil  nefazodone  rifampin  St. John's wort This list may not describe all possible interactions. Give your health care provider a list of all the medicines, herbs, non-prescription drugs, or dietary supplements you use. Also tell them if you smoke, drink alcohol, or use illegal drugs. Some items may interact with your medicine. What should I watch for while using this medicine? Your condition will be monitored carefully while you are receiving this medicine. You will need important blood work  done while you are taking this medicine. This medicine can cause serious allergic reactions. To reduce your risk you will need to take other medicine(s) before treatment with this medicine. If you  experience allergic reactions like skin rash, itching or hives, swelling of the face, lips, or tongue, tell your doctor or health care professional right away. In some cases, you may be given additional medicines to help with side effects. Follow all directions for their use. This drug may make you feel generally unwell. This is not uncommon, as chemotherapy can affect healthy cells as well as cancer cells. Report any side effects. Continue your course of treatment even though you feel ill unless your doctor tells you to stop. Call your doctor or health care professional for advice if you get a fever, chills or sore throat, or other symptoms of a cold or flu. Do not treat yourself. This drug decreases your body's ability to fight infections. Try to avoid being around people who are sick. This medicine may increase your risk to bruise or bleed. Call your doctor or health care professional if you notice any unusual bleeding. Be careful brushing and flossing your teeth or using a toothpick because you may get an infection or bleed more easily. If you have any dental work done, tell your dentist you are receiving this medicine. Avoid taking products that contain aspirin, acetaminophen, ibuprofen, naproxen, or ketoprofen unless instructed by your doctor. These medicines may hide a fever. Do not become pregnant while taking this medicine. Women should inform their doctor if they wish to become pregnant or think they might be pregnant. There is a potential for serious side effects to an unborn child. Talk to your health care professional or pharmacist for more information. Do not breast-feed an infant while taking this medicine. Men are advised not to father a child while receiving this medicine. This product may contain alcohol. Ask your pharmacist or healthcare provider if this medicine contains alcohol. Be sure to tell all healthcare providers you are taking this medicine. Certain medicines, like metronidazole  and disulfiram, can cause an unpleasant reaction when taken with alcohol. The reaction includes flushing, headache, nausea, vomiting, sweating, and increased thirst. The reaction can last from 30 minutes to several hours. What side effects may I notice from receiving this medicine? Side effects that you should report to your doctor or health care professional as soon as possible:  allergic reactions like skin rash, itching or hives, swelling of the face, lips, or tongue  breathing problems  changes in vision  fast, irregular heartbeat  high or low blood pressure  mouth sores  pain, tingling, numbness in the hands or feet  signs of decreased platelets or bleeding - bruising, pinpoint red spots on the skin, black, tarry stools, blood in the urine  signs of decreased red blood cells - unusually weak or tired, feeling faint or lightheaded, falls  signs of infection - fever or chills, cough, sore throat, pain or difficulty passing urine  signs and symptoms of liver injury like dark yellow or brown urine; general ill feeling or flu-like symptoms; light-colored stools; loss of appetite; nausea; right upper belly pain; unusually weak or tired; yellowing of the eyes or skin  swelling of the ankles, feet, hands  unusually slow heartbeat Side effects that usually do not require medical attention (report to your doctor or health care professional if they continue or are bothersome):  diarrhea  hair loss  loss of appetite  muscle or joint pain  nausea, vomiting  pain, redness, or irritation at site where injected  tiredness This list may not describe all possible side effects. Call your doctor for medical advice about side effects. You may report side effects to FDA at 1-800-FDA-1088. Where should I keep my medicine? This drug is given in a hospital or clinic and will not be stored at home. NOTE: This sheet is a summary. It may not cover all possible information. If you have  questions about this medicine, talk to your doctor, pharmacist, or health care provider.  2020 [employer] (2017-07-05 13:14:55)

## 2020-01-14 ENCOUNTER — Telehealth: Payer: Self-pay | Admitting: Hematology

## 2020-01-14 NOTE — Telephone Encounter (Signed)
Scheduled appts per 2/26 los.  Left a detailed voice message of her adjusted appts on 3/5.

## 2020-01-15 ENCOUNTER — Encounter: Payer: Self-pay | Admitting: Hematology

## 2020-01-17 NOTE — Progress Notes (Signed)
[place]  [phone] [phone]  Clinic Follow up Note   Patient Care Team: Burnett Sheng, MD as PCP - General (Family Medicine) Mauro Kaufmann, RN as Oncology Nurse Navigator Rockwell Germany, RN as Oncology Nurse Navigator Jovita Kussmaul, MD as Consulting Physician (General Surgery) Truitt Merle, MD as Consulting Physician (Hematology) Eppie Gibson, MD as Attending Physician (Radiation Oncology)  Date of Service:  01/11/2020  CHIEF COMPLAINT: F/u of right breast cancer  SUMMARY OF ONCOLOGIC HISTORY:     Oncology History Overview Note   Cancer Staging Malignant neoplasm of upper-outer quadrant of right breast in female, estrogen receptor positive ([place]) Staging form: Breast, AJCC 8th Edition - Clinical stage from 08/28/2019: Stage IB (cT2, cN0, cM0, G2, ER+, PR+, HER2-) - Signed by Truitt Merle, MD on 09/04/2019 - Pathologic stage from 09/24/2019: Stage IB (pT2, pN1a, cM0, G2, ER+, PR+, HER2-) - Signed by Truitt Merle, MD on 10/10/2019    Malignant neoplasm of upper-outer quadrant of right breast in female, estrogen receptor positive ([place])   08/24/2019 Mammogram    Diagnostic mammogram and [place] 08/24/19 IMPRESSION: Suspicious right breast mass 10 o'clock 1 cm from the nipple measuring 3.3 x 1.9 x 1.9 cm and axillary adenopathy.    08/28/2019 Initial Biopsy    Diagnosis 08/28/19 1. Breast, right, needle core biopsy, 10 o'clock - INVASIVE MAMMARY CARCINOMA, GRADE II. - SEE MICROSCOPIC DESCRIPTION. 2. Lymph node, needle/core biopsy, right axilla - BENIGN LYMPH NODE. - NO METASTATIC CARCINOMA IDENTIFIED.     08/28/2019 Receptors her2    Results: IMMUNOHISTOCHEMICAL AND MORPHOMETRIC ANALYSIS PERFORMED MANUALLY The tumor cells are NEGATIVE for Her2 (1+). Estrogen Receptor: 100%, POSITIVE, STRONG STAINING INTENSITY Progesterone Receptor: 100%, POSITIVE, STRONG STAINING INTENSITY Proliferation Marker Ki67: 10%    08/28/2019  Cancer Staging    Staging form: Breast, AJCC 8th Edition - Clinical stage from 08/28/2019: Stage IB (cT2, cN0, cM0, G2, ER+, PR+, HER2-) - Signed by Truitt Merle, MD on 09/04/2019    08/31/2019 Initial Diagnosis    Malignant neoplasm of upper-outer quadrant of right breast in female, estrogen receptor positive ([place])    09/07/2019 Breast MRI    Breast MRI 09/07/19  IMPRESSION: 1. 2.5 cm known malignancy in the anterior right breast, superficial depth near the nipple. 2. 7-8 mm indeterminate mass in the central upper outer quadrant of the right breast. 3. One right axillary lymph node with apparent mild cortical thickening, which lies slightly anterior and inferior to the biopsied right axillary lymph node ( benign reactive on pathology). This is most likely also benign given the pathology from the adjacent biopsied lymph node. 4. No evidence of left breast malignancy.    09/17/2019 Imaging    [place] right breast 09/17/19  IMPRESSION: Several benign appearing masses in the upper outer quadrant of the right breast without a definite correlate to the finding seen on prior MRI. The right axillary lymph nodes appear similar to the recently biopsied benign lymph node.    09/20/2019 Pathology Results    Diagnosis 09/20/19  Breast, right, needle core biopsy, upper outer quadrant - FIBROADENOMA - NO MALIGNANCY IDENTIFIED     09/21/2019 Genetic Testing    Negative genetic testing:  No pathogenic variants detected on the [facility] Breast Cancer STAT panel and Common Hereditary Cancers panel.  A variant of uncertain significance was identified in the BRCA1 gene, called c.2048A>G ([id number]).  The report date is 09/21/2019.  The STAT Breast cancer panel offered by [facility] includes sequencing and rearrangement  analysis for the following 9 genes:  ATM, BRCA1, BRCA2, CDH1, CHEK2, PALB2, PTEN, STK11 and TP53.   The Common Hereditary Cancers Panel offered by [facility] includes sequencing and/or  deletion duplication testing of the following 48 genes: APC, ATM, AXIN2, BARD1, BMPR1A, BRCA1, BRCA2, BRIP1, CDH1, CDK4, CDKN2A (p14ARF), CDKN2A (p16INK4a), CHEK2, CTNNA1, DICER1, EPCAM (Deletion/duplication testing only), GREM1 (promoter region deletion/duplication testing only), KIT, MEN1, MLH1, MSH2, MSH3, MSH6, MUTYH, NBN, NF1, NHTL1, PALB2, PDGFRA, PMS2, POLD1, POLE, PTEN, RAD50, RAD51C, RAD51D, RNF43, SDHB, SDHC, SDHD, SMAD4, SMARCA4. STK11, TP53, TSC1, TSC2, and VHL.  The following genes were evaluated for sequence changes only: SDHA and HOXB13 c.251G>A variant only.     09/24/2019 Surgery    RIGHT BREAST LUMPECTOMY WITH SENTINEL LYMPH NODE BIOPSY by Dr Marlou Starks  09/24/19     09/24/2019 Pathology Results    FINAL MICROSCOPIC DIAGNOSIS: 09/24/19   A. LYMPH NODE, RIGHT #1, SENTINEL, BIOPSY:  - There is no evidence of carcinoma in 1 of 1 lymph node (0/1).   B. LYMPH NODE, RIGHT, SENTINEL, BIOPSY:  - There is no evidence of carcinoma in 1 of 1 lymph node (0/1).   C. 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   D. LYMPH NODE, RIGHT #2, SENTINEL, BIOPSY:  - There is no evidence of carcinoma in 1 of 1 lymph node (0/1).   E. LYMPH NODE, RIGHT #3 , SENTINEL, BIOPSY:  - There is no evidence of carcinoma in 1 of 1 lymph node (0/1).   F. LYMPH NODE, RIGHT, SENTINEL, BIOPSY:  - There is no evidence of carcinoma in 1 of 1 lymph node (0/1).   G. LYMPH NODE, RIGHT, SENTINEL, BIOPSY:  -There is no evidence of carcinoma in 1 of 1 lymph node (0/1).   H. 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  - Ductal carcinoma in situ, intermediate grade.  - Perineural invasion is identified.  - The surgical resection margins are negative for carcinoma.  - See oncology table below   I. BREAST, RIGHT ADDITIONAL INFERIOR MARGIN, EXCISION:  - Fibrocystic changes.  - There is no evidence of malignancy.        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 Postoperative findings in the right breast and axilla. Upper normal sized right axillary lymph nodes, nonspecific. 2. 2 mm superior apical segment right upper lobe nodule is technically nonspecific although statistically likely to be benign. 3. 3.6 cm hypodense right adnexal structure, probably a cyst but technically nonspecific. If patient is premenopausal, no follow up is warranted; if early postmenopausal, follow up ultrasound in 6-12 months would be recommended. 4. Other imaging findings of potential clinical significance: Small type 1 hiatal hernia. Descending and sigmoid colon diverticulosis.  Small indirect left inguinal hernia contains adipose tissue.  Aortic Atherosclerosis (ICD10-I70.0).    10/26/2019 Imaging    Whole Body Bone scan 10/26/19  IMPRESSION: Nonspecific sites of uptake at the RIGHT ischium and anterior LEFT sixth rib, cannot exclude metastatic foci.  Otherwise negative exam.     10/29/2019 -  Chemotherapy    Adjuvant AC q2weeks for 4 cycles starting 10/29/19-2/5/21followed by weekly Taxol for 12 weeks starting 01/11/20.        CURRENT THERAPY:  Adjuvant AC q2weeks for 4 cycles starting 10/29/19-2/5/21followed by weekly Taxol for 12 weeks starting 01/11/20. Today is her  second weekly dose of Taxol.   INTERVAL HISTORY:  Paula Davidson is here for a follow up and treatment. She received her first dose of weekly taxol last week and she tolerated it well without any concerning adverse side effects except for fatigue. She denies any fever, chills, night sweats, or weight loss. Her appetite is improved from prior. She denies chest pain, shortness of breath, or cough. She denies nausea, vomiting, diarrhea, or constipation. She denies peripheral neuropathy. She is here today for evaluation before starting her second weekly dose of taxol.     MEDICAL HISTORY: Past Medical History:  Diagnosis Date  . Anxiety   . Cancer ([place]) 09/24/2019   right breast cancer-had lumpectomy  . Depression   . DUB (dysfunctional uterine bleeding)   . Family history of pancreatic cancer   . Hypertension     ALLERGIES:  is allergic to hydrochlorothiazide; codeine; and lisinopril.  MEDICATIONS:  Current Outpatient Medications  Medication Sig Dispense Refill  . amitriptyline (ELAVIL) 25 MG tablet Take 25 mg by mouth at bedtime as needed for sleep.    Marland Kitchen amLODipine (NORVASC) 5 MG tablet Take 5 mg by mouth daily.    Marland Kitchen Bioflavonoid Products (ESTER C PO) Take 1 tablet by mouth daily.    . Calcium Carbonate-Vitamin D (CALCIUM + D PO) Take 1 tablet by mouth daily.     . cetirizine (ZYRTEC) 10 MG tablet Take 10 mg by mouth daily as needed for allergies.    . cholecalciferol (VITAMIN D) 25 MCG (1000 UT) tablet Take 1,000 Units by mouth daily.    . citalopram (CELEXA) 20 MG tablet Take 20 mg by mouth daily.    Marland Kitchen HYDROcodone-acetaminophen (NORCO/VICODIN) 5-325 MG tablet Take 1-2 tablets by mouth every 6 (six) hours as needed for moderate pain or severe pain. 10 tablet 0  . lidocaine-prilocaine (EMLA) cream Apply to affected area once 30 g 3  . LORazepam (ATIVAN) 0.5 MG tablet Take 1-2 tablets (0.5-1 mg total) by mouth every 8 (eight) hours as needed for anxiety (nausea). 20 tablet 0  . losartan  (COZAAR) 100 MG tablet Take 100 mg by mouth daily.    . Multiple Vitamin (MULTIVITAMIN WITH MINERALS) TABS tablet Take 1 tablet by mouth daily.    . ondansetron (ZOFRAN) 8 MG tablet Take 1 tablet (8 mg total) by mouth 2 (two) times daily as needed. Start on the third day after chemotherapy. 30 tablet 1  . prochlorperazine (COMPAZINE) 10 MG tablet Take 1 tablet (10 mg total) by mouth every 6 (six) hours as needed (Nausea or vomiting). 30 tablet 1  . promethazine (PHENERGAN) 25 MG tablet Take 1 tablet (25 mg total) by mouth every 6 (six) hours as needed for nausea or vomiting. 30 tablet 0   No current facility-administered medications for this visit.   Facility-Administered Medications Ordered in Other Visits  Medication Dose Route Frequency  Provider Last Rate Last Admin  . famotidine (PEPCID) IVPB 20 mg premix  20 mg Intravenous Once Truitt Merle, MD 200 mL/hr at 01/18/20 0941 20 mg at 01/18/20 0941  . heparin lock flush 100 unit/mL  500 Units Intracatheter Once PRN Truitt Merle, MD      . PACLitaxel (TAXOL) 198 mg in sodium chloride 0.9 % 250 mL chemo infusion (</= 26m/m2)  80 mg/m2 (Order-Specific) Intravenous Once FTruitt Merle MD      . sodium chloride flush (NS) 0.9 % injection 10 mL  10 mL Intracatheter PRN FTruitt Merle MD        SURGICAL HISTORY:  Past Surgical History:  Procedure Laterality Date  . BREAST LUMPECTOMY WITH AXILLARY LYMPH NODE BIOPSY Right 09/24/2019   Procedure: RIGHT BREAST LUMPECTOMY WITH SENTINEL LYMPH NODE BIOPSY;  Surgeon: TJovita Kussmaul MD;  [facility]  Service: General;  Laterality: Right;  . BREAST SURGERY  09/24/2019   Breast lump excised  . FOOT SURGERY    . LAPAROSCOPIC GASTRIC BANDING    . PORTACATH PLACEMENT N/A 10/25/2019   Procedure: INSERTION PORT-A-CATH WITH POSSIBLE ULTRASOUND;  Surgeon: TJovita Kussmaul MD;  Location: [facility];  Service: General;  Laterality: N/A;  . WISDOM TOOTH EXTRACTION      REVIEW OF SYSTEMS:   Review of  Systems  Constitutional: Positive for fatigue. Negative for appetite change, chills, fever and unexpected weight change.  HENT: Negative for mouth sores, nosebleeds, sore throat and trouble swallowing.   Eyes: Negative for eye problems and icterus.  Respiratory: Negative for cough, hemoptysis, shortness of breath and wheezing.  Cardiovascular: Negative for chest pain and leg swelling.  Gastrointestinal: Negative for abdominal pain, constipation, diarrhea, nausea and vomiting.  Genitourinary: Negative for bladder incontinence, difficulty urinating, dysuria, frequency and hematuria.   Musculoskeletal: Negative for back pain, gait problem, neck pain and neck stiffness.  Skin: Negative for itching and rash.  Neurological: Negative for dizziness, extremity weakness, gait problem, headaches, light-headedness and seizures.  Hematological: Negative for adenopathy. Does not bruise/bleed easily.  Psychiatric/Behavioral: Negative for confusion, depression and sleep disturbance. The patient is not nervous/anxious.     PHYSICAL EXAMINATION:  Blood pressure (!) 161/93, pulse 84, temperature 97.8 F (36.6 C), temperature source Temporal, resp. rate 20, height 5' 6"  (1.676 m), weight 280 lb 9.6 oz (127.3 kg), SpO2 100 %.  ECOG PERFORMANCE STATUS: 1 - Symptomatic but completely ambulatory  Physical Exam  Constitutional: Oriented to person, place, and time and well-developed, well-nourished, and in no distress.   HENT:  Head: Normocephalic and atraumatic.  Mouth/Throat: Oropharynx is clear and moist. No oropharyngeal exudate.  Eyes: Conjunctivae are normal. Right eye exhibits no discharge. Left eye exhibits no discharge. No scleral icterus.  Neck: Normal range of motion. Neck supple.  Cardiovascular: Normal rate, regular rhythm, normal heart sounds and intact distal pulses.   Pulmonary/Chest: Effort normal and breath sounds normal. No respiratory distress. No wheezes. No rales.  Abdominal: Soft. Bowel  sounds are normal. Exhibits no distension and no mass. There is no tenderness.  Musculoskeletal: Normal range of motion. Exhibits no edema.  Lymphadenopathy:    No cervical adenopathy.  Neurological: Alert and oriented to person, place, and time. Exhibits normal muscle tone. Gait normal. Coordination normal.  Skin: Skin is warm and dry. No rash noted. Not diaphoretic. No erythema. No pallor.  Psychiatric: Mood, memory and judgment normal.  Vitals reviewed.  LABORATORY DATA: Lab Results  Component Value Date   WBC 5.4 01/18/2020  HGB 9.0 (L) 01/18/2020   HCT 28.6 (L) 01/18/2020   MCV 84.4 01/18/2020   PLT 379 01/18/2020      Chemistry      Component Value Date/Time   NA 138 01/18/2020 0840   K 4.0 01/18/2020 0840   CL 108 01/18/2020 0840   CO2 25 01/18/2020 0840   BUN 8 01/18/2020 0840   CREATININE 0.67 01/18/2020 0840      Component Value Date/Time   CALCIUM 9.2 01/18/2020 0840   ALKPHOS 54 01/18/2020 0840   AST 18 01/18/2020 0840   ALT 30 01/18/2020 0840   BILITOT 0.3 01/18/2020 0840       RADIOGRAPHIC STUDIES:  No results found.   ASSESSMENT & PLAN:  Joselin Crandell is a 49 y.o. 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  -She underwent right lumpectomy and SLNB with Dr. TMarlou Starkson 09/24/19.Her mammaprint showedHigh Risk Luminal Type B with 29% risk of recurrence in 10 years. -To reduce her high risk adjuvant chemoshe was started her on adjuvantIV chemoAC q2weeks for 4 cycles12/14/20-2/5/21with C4 at 25% followed by weekly Taxol for 12 weeks starting 01/11/20.  -She has  recovered overall from chemo AC with no more nausea, diarrhea and recovered energy. She started her weekly taxol on 01/11/20. Side effects were reviewed with the patient, mainly neuropathy. She was agreeable to beusing  ice bags during infusion. She [facility] She is open to more information but not sure if she is interested in proceeding with study.  -Physical exam benign. Labs reviewed, overall adequate to proceed with week 2 Taxol today.  -She has participated in our observational UPBEAT clinical trail, no AEs. -F/u every 2 weeks.   2. Genetic Testingnegative for pathogenetic mutations -She does haveVUS of BRCA1 c.2048A>G  3. Menorrhagia  -Shehassevere menorrhagia with long term bleeding. Shepreviouslyrequired IV iron.Her Gyn started Mirena 5 years ago which stopped her periods.  -She had Mirena removed in 08/2019. Her period has not returned yet. -Given her age she may start menopause soon. Will test her hormonal level in the near futureif she has no menstrual perioddoes not return.  -She has h/o of ovarian cyst. She currently has one of right ovary as seen on 10/26/19 CT scan. Will f/u with Gyn. -She has not had period on chemo so far.   4. HTN, Depression, Anxiety, obesity -On Amlodipine, Losartan, Lopressorand Celexa -She notes she lives with [place] -Blood pressure elevated today. She reports taking her BP medications as prescribed. Rechecked BP 144/77  5. Anemia  -Has been secondary to menorrhagia in the past, now secondary to chemo  -Has been slowly trending down. Stable today Hg at 9.0, no need blood transfusion now    PLAN: -Labs reviewed and adequate to proceed with week 2 Taxol today, she will do cryotherapy  -Lab, flush, chemo Taxol in 1 week -F/u in 2 weeks with week 4.    No orders of the defined types were placed in this encounter.    Carrisa Keller L  Hjalmer Iovino, PA-C 01/18/20

## 2020-01-18 ENCOUNTER — Inpatient Hospital Stay: Payer: BLUE CROSS/BLUE SHIELD

## 2020-01-18 ENCOUNTER — Inpatient Hospital Stay: Payer: BLUE CROSS/BLUE SHIELD | Attending: Hematology

## 2020-01-18 ENCOUNTER — Other Ambulatory Visit: Payer: BLUE CROSS/BLUE SHIELD

## 2020-01-18 ENCOUNTER — Ambulatory Visit: Payer: BLUE CROSS/BLUE SHIELD | Admitting: Physician Assistant

## 2020-01-18 ENCOUNTER — Inpatient Hospital Stay (HOSPITAL_BASED_OUTPATIENT_CLINIC_OR_DEPARTMENT_OTHER): Payer: BLUE CROSS/BLUE SHIELD | Admitting: Physician Assistant

## 2020-01-18 ENCOUNTER — Other Ambulatory Visit: Payer: Self-pay

## 2020-01-18 VITALS — BP 144/77 | HR 80

## 2020-01-18 VITALS — BP 161/93 | HR 84 | Temp 97.8°F | Resp 20 | Ht 66.0 in | Wt 280.6 lb

## 2020-01-18 DIAGNOSIS — D649 Anemia, unspecified: Secondary | ICD-10-CM | POA: Diagnosis not present

## 2020-01-18 DIAGNOSIS — E669 Obesity, unspecified: Secondary | ICD-10-CM | POA: Insufficient documentation

## 2020-01-18 DIAGNOSIS — Z170 Estrogen receptor positive status [ER+]: Secondary | ICD-10-CM | POA: Insufficient documentation

## 2020-01-18 DIAGNOSIS — I700 Atherosclerosis of aorta: Secondary | ICD-10-CM | POA: Diagnosis not present

## 2020-01-18 DIAGNOSIS — C50411 Malignant neoplasm of upper-outer quadrant of right female breast: Secondary | ICD-10-CM | POA: Diagnosis not present

## 2020-01-18 DIAGNOSIS — Z5111 Encounter for antineoplastic chemotherapy: Secondary | ICD-10-CM | POA: Insufficient documentation

## 2020-01-18 DIAGNOSIS — R911 Solitary pulmonary nodule: Secondary | ICD-10-CM | POA: Diagnosis not present

## 2020-01-18 DIAGNOSIS — N920 Excessive and frequent menstruation with regular cycle: Secondary | ICD-10-CM | POA: Insufficient documentation

## 2020-01-18 DIAGNOSIS — K449 Diaphragmatic hernia without obstruction or gangrene: Secondary | ICD-10-CM | POA: Insufficient documentation

## 2020-01-18 DIAGNOSIS — Z79899 Other long term (current) drug therapy: Secondary | ICD-10-CM | POA: Diagnosis not present

## 2020-01-18 DIAGNOSIS — F418 Other specified anxiety disorders: Secondary | ICD-10-CM | POA: Diagnosis not present

## 2020-01-18 DIAGNOSIS — I10 Essential (primary) hypertension: Secondary | ICD-10-CM | POA: Diagnosis not present

## 2020-01-18 DIAGNOSIS — G4700 Insomnia, unspecified: Secondary | ICD-10-CM | POA: Diagnosis not present

## 2020-01-18 LAB — CBC WITH DIFFERENTIAL (CANCER CENTER ONLY)
Abs Immature Granulocytes: 0.05 10*3/uL (ref 0.00–0.07)
Basophils Absolute: 0.1 10*3/uL (ref 0.0–0.1)
Basophils Relative: 1 %
Eosinophils Absolute: 0.2 10*3/uL (ref 0.0–0.5)
Eosinophils Relative: 3 %
HCT: 28.6 % — ABNORMAL LOW (ref 36.0–46.0)
Hemoglobin: 9 g/dL — ABNORMAL LOW (ref 12.0–15.0)
Immature Granulocytes: 1 %
Lymphocytes Relative: 19 %
Lymphs Abs: 1 10*3/uL (ref 0.7–4.0)
MCH: 26.5 pg (ref 26.0–34.0)
MCHC: 31.5 g/dL (ref 30.0–36.0)
MCV: 84.4 fL (ref 80.0–100.0)
Monocytes Absolute: 0.6 10*3/uL (ref 0.1–1.0)
Monocytes Relative: 11 %
Neutro Abs: 3.5 10*3/uL (ref 1.7–7.7)
Neutrophils Relative %: 65 %
Platelet Count: 379 10*3/uL (ref 150–400)
RBC: 3.39 MIL/uL — ABNORMAL LOW (ref 3.87–5.11)
RDW: 18.9 % — ABNORMAL HIGH (ref 11.5–15.5)
WBC Count: 5.4 10*3/uL (ref 4.0–10.5)
nRBC: 0 % (ref 0.0–0.2)

## 2020-01-18 LAB — CMP (CANCER CENTER ONLY)
ALT: 30 U/L (ref 0–44)
AST: 18 U/L (ref 15–41)
Albumin: 3.5 g/dL (ref 3.5–5.0)
Alkaline Phosphatase: 54 U/L (ref 38–126)
Anion gap: 5 (ref 5–15)
BUN: 8 mg/dL (ref 6–20)
CO2: 25 mmol/L (ref 22–32)
Calcium: 9.2 mg/dL (ref 8.9–10.3)
Chloride: 108 mmol/L (ref 98–111)
Creatinine: 0.67 mg/dL (ref 0.44–1.00)
GFR, Est AFR Am: >60 mL/min (ref >60)
GFR, Estimated: >60 mL/min (ref >60)
Glucose, Bld: 106 mg/dL — ABNORMAL HIGH (ref 70–99)
Potassium: 4 mmol/L (ref 3.5–5.1)
Sodium: 138 mmol/L (ref 135–145)
Total Bilirubin: 0.3 mg/dL (ref 0.3–1.2)
Total Protein: 7.1 g/dL (ref 6.5–8.1)

## 2020-01-18 MED ORDER — HEPARIN SOD (PORK) LOCK FLUSH 100 UNIT/ML IV SOLN
500.0000 [IU] | Freq: Once | INTRAVENOUS | Status: AC | PRN
  Administered 2020-01-18: 500 [IU]
  Filled 2020-01-18: qty 5

## 2020-01-18 MED ORDER — DIPHENHYDRAMINE HCL 50 MG/ML IJ SOLN
INTRAMUSCULAR | Status: AC
  Filled 2020-01-18: qty 1

## 2020-01-18 MED ORDER — FAMOTIDINE IN NACL 20-0.9 MG/50ML-% IV SOLN
INTRAVENOUS | Status: AC
  Filled 2020-01-18: qty 50

## 2020-01-18 MED ORDER — SODIUM CHLORIDE 0.9 % IV SOLN
Freq: Once | INTRAVENOUS | Status: AC
  Filled 2020-01-18: qty 250

## 2020-01-18 MED ORDER — FAMOTIDINE IN NACL 20-0.9 MG/50ML-% IV SOLN
20.0000 mg | Freq: Once | INTRAVENOUS | Status: AC
  Administered 2020-01-18: 20 mg via INTRAVENOUS

## 2020-01-18 MED ORDER — SODIUM CHLORIDE 0.9% FLUSH
10.0000 mL | INTRAVENOUS | Status: DC | PRN
  Administered 2020-01-18: 10 mL
  Filled 2020-01-18: qty 10

## 2020-01-18 MED ORDER — DIPHENHYDRAMINE HCL 50 MG/ML IJ SOLN
25.0000 mg | Freq: Once | INTRAMUSCULAR | Status: AC
  Administered 2020-01-18: 25 mg via INTRAVENOUS

## 2020-01-18 MED ORDER — DEXAMETHASONE SODIUM PHOSPHATE 10 MG/ML IJ SOLN
10.0000 mg | Freq: Once | INTRAMUSCULAR | Status: AC
  Administered 2020-01-18: 10 mg via INTRAVENOUS

## 2020-01-18 MED ORDER — SODIUM CHLORIDE 0.9 % IV SOLN
80.0000 mg/m2 | Freq: Once | INTRAVENOUS | Status: AC
  Administered 2020-01-18: 198 mg via INTRAVENOUS
  Filled 2020-01-18: qty 33

## 2020-01-18 MED ORDER — DEXAMETHASONE SODIUM PHOSPHATE 10 MG/ML IJ SOLN
INTRAMUSCULAR | Status: AC
  Filled 2020-01-18: qty 1

## 2020-01-18 NOTE — Patient Instructions (Signed)
Tunneled Central Venous Catheter Flushing Guide  It is important to flush your tunneled central venous catheter each time you use it, both before and after you use it. Flushing your catheter will help prevent it from clogging. What are the risks? Risks may include:  Infection.  Air getting into the catheter and bloodstream. Supplies needed:  A clean pair of gloves.  A disinfecting wipe. Use an alcohol wipe, chlorhexidine wipe, or iodine wipe as told by your health care provider.  A 10 mL syringe that has been prefilled with saline solution.  An empty 10 mL syringe, if a substance called heparin was injected into your catheter. How to flush your catheter When you flush your catheter, make sure you follow any specific instructions from your health care provider or the manufacturer. These are general guidelines. Flushing your catheter before use If there is heparin in your catheter: 1. Wash your hands with soap and water. 2. Put on gloves. 3. Scrub the injection cap for a minimum of 15 seconds with a disinfecting wipe. 4. Unclamp the catheter. 5. Attach the empty syringe to the injection cap. 6. Pull the syringe plunger back and withdraw 10 mL of blood. 7. Place the syringe into an appropriate waste container. 8. Scrub the injection cap for 15 seconds with a disinfecting wipe. 9. Attach the prefilled syringe to the injection cap. 10. Flush the catheter by pushing the plunger forward until all the liquid from the syringe is in the catheter. 11. Remove the syringe from the injection cap. 12. Clamp the catheter. If there is no heparin in your catheter: 1. Wash your hands with soap and water. 2. Put on gloves. 3. Scrub the injection cap for 15 seconds with a disinfecting wipe. 4. Unclamp the catheter. 5. Attach the prefilled syringe to the injection cap. 6. Flush the catheter by pushing the plunger forward until 5 mL of the liquid from the syringe is in the catheter. 7. Pull back on  the syringe until you see blood in the catheter. 8. If you have been asked to collect any blood, follow your health care provider's instructions. Otherwise, flush the catheter with the rest of the solution from the syringe. 9. Remove the syringe from the injection cap. 10. Clamp the catheter.  Flushing your catheter after use 1. Wash your hands with soap and water. 2. Put on gloves. 3. Scrub the injection cap for 15 seconds with a disinfecting wipe. 4. Unclamp the catheter. 5. Attach the prefilled syringe to the injection cap. 6. Flush the catheter by pushing the plunger forward until all of the liquid from the syringe is in the catheter. 7. Remove the syringe from the injection cap. 8. Clamp the catheter. Problems and solutions  If blood cannot be completely cleared from the injection cap, you may need to have the injection cap replaced.  If the catheter is difficult to flush, use the pulsing method. The pulsing method involves pushing only a few milliliters of solution into the catheter at a time and pausing between pushes.  If you do not see blood in the catheter when you pull back on the syringe, change your body position, such as by raising your arms above your head. Take a deep breath and cough. Then, pull back on the syringe. If you still do not see blood, flush the catheter with a small amount of solution. Then, change positions again and take a breath or cough. Pull back on the syringe again. If you still do not see   blood, finish flushing the catheter and contact your health care provider. Do not use your catheter until your health care provider says it is okay. General tips  Have someone help you flush your catheter, if possible.  Do not force fluid through your catheter.  Do not use a syringe that is larger or smaller than 10 mL. Using a smaller syringe can make the catheter burst.  Do not use your catheter without flushing it first if it has heparin in it. Contact a health  care provider if:  You cannot see any blood in the catheter when you flush it before using it.  Your catheter is difficult to flush. Get help right away if:  You cannot flush the catheter.  The catheter leaks when you flush it or when there is fluid in it.  There are cracks or breaks in the catheter. Summary  It is important to flush your tunneled central venous catheter each time you use it, both before and after you use it.  Scrub the injection cap for 15 seconds with a disinfecting wipe before and after you flush it.  When you flush your catheter, make sure you follow any specific instructions from your health care provider or the manufacturer.  Get help right away if you cannot flush the catheter. This information is not intended to replace advice given to you by your health care provider. Make sure you discuss any questions you have with your health care provider. Document Revised: 07/27/2019 Document Reviewed: 01/17/2019 Elsevier Patient Education  2020 [employer]  

## 2020-01-18 NOTE — Patient Instructions (Signed)
[facility]charge Instructions for Patients Receiving Chemotherapy  Today you received the following chemotherapy agents Taxol  To help prevent nausea and vomiting after your treatment, we encourage you to take your nausea medication as directed   If you develop nausea and vomiting that is not controlled by your nausea medication, call the clinic.   BELOW ARE SYMPTOMS THAT SHOULD BE REPORTED IMMEDIATELY:  *FEVER GREATER THAN 100.5 F  *CHILLS WITH OR WITHOUT FEVER  NAUSEA AND VOMITING THAT IS NOT CONTROLLED WITH YOUR NAUSEA MEDICATION  *UNUSUAL SHORTNESS OF BREATH  *UNUSUAL BRUISING OR BLEEDING  TENDERNESS IN MOUTH AND THROAT WITH OR WITHOUT PRESENCE OF ULCERS  *URINARY PROBLEMS  *BOWEL PROBLEMS  UNUSUAL RASH Items with * indicate a potential emergency and should be followed up as soon as possible.  Feel free to call the clinic should you have any questions or concerns. The clinic phone number is [phone].  Please show the [facility] at check-in to the Emergency Department and triage nurse.  Paclitaxel injection What is this medicine? PACLITAXEL (PAK li TAX el) is a chemotherapy drug. It targets fast dividing cells, like cancer cells, and causes these cells to die. This medicine is used to treat ovarian cancer, breast cancer, lung cancer, Kaposi's sarcoma, and other cancers. This medicine may be used for other purposes; ask your health care provider or pharmacist if you have questions. COMMON BRAND NAME(S): Onxol, Taxol What should I tell my health care provider before I take this medicine? They need to know if you have any of these conditions:  history of irregular heartbeat  liver disease  low blood counts, like low white cell, platelet, or red cell counts  lung or breathing disease, like asthma  tingling of the fingers or toes, or other nerve disorder  an unusual or allergic reaction to paclitaxel, alcohol, polyoxyethylated castor  oil, other chemotherapy, other medicines, foods, dyes, or preservatives  pregnant or trying to get pregnant  breast-feeding How should I use this medicine? This drug is given as an infusion into a vein. It is administered in a hospital or clinic by a specially trained health care professional. Talk to your pediatrician regarding the use of this medicine in children. Special care may be needed. Overdosage: If you think you have taken too much of this medicine contact a poison control center or emergency room at once. NOTE: This medicine is only for you. Do not share this medicine with others. What if I miss a dose? It is important not to miss your dose. Call your doctor or health care professional if you are unable to keep an appointment. What may interact with this medicine? Do not take this medicine with any of the following medications:  disulfiram  metronidazole This medicine may also interact with the following medications:  antiviral medicines for hepatitis, HIV or AIDS  certain antibiotics like erythromycin and clarithromycin  certain medicines for fungal infections like ketoconazole and itraconazole  certain medicines for seizures like carbamazepine, phenobarbital, phenytoin  gemfibrozil  nefazodone  rifampin  St. John's wort This list may not describe all possible interactions. Give your health care provider a list of all the medicines, herbs, non-prescription drugs, or dietary supplements you use. Also tell them if you smoke, drink alcohol, or use illegal drugs. Some items may interact with your medicine. What should I watch for while using this medicine? Your condition will be monitored carefully while you are receiving this medicine. You will need important blood work  done while you are taking this medicine. This medicine can cause serious allergic reactions. To reduce your risk you will need to take other medicine(s) before treatment with this medicine. If you  experience allergic reactions like skin rash, itching or hives, swelling of the face, lips, or tongue, tell your doctor or health care professional right away. In some cases, you may be given additional medicines to help with side effects. Follow all directions for their use. This drug may make you feel generally unwell. This is not uncommon, as chemotherapy can affect healthy cells as well as cancer cells. Report any side effects. Continue your course of treatment even though you feel ill unless your doctor tells you to stop. Call your doctor or health care professional for advice if you get a fever, chills or sore throat, or other symptoms of a cold or flu. Do not treat yourself. This drug decreases your body's ability to fight infections. Try to avoid being around people who are sick. This medicine may increase your risk to bruise or bleed. Call your doctor or health care professional if you notice any unusual bleeding. Be careful brushing and flossing your teeth or using a toothpick because you may get an infection or bleed more easily. If you have any dental work done, tell your dentist you are receiving this medicine. Avoid taking products that contain aspirin, acetaminophen, ibuprofen, naproxen, or ketoprofen unless instructed by your doctor. These medicines may hide a fever. Do not become pregnant while taking this medicine. Women should inform their doctor if they wish to become pregnant or think they might be pregnant. There is a potential for serious side effects to an unborn child. Talk to your health care professional or pharmacist for more information. Do not breast-feed an infant while taking this medicine. Men are advised not to father a child while receiving this medicine. This product may contain alcohol. Ask your pharmacist or healthcare provider if this medicine contains alcohol. Be sure to tell all healthcare providers you are taking this medicine. Certain medicines, like metronidazole  and disulfiram, can cause an unpleasant reaction when taken with alcohol. The reaction includes flushing, headache, nausea, vomiting, sweating, and increased thirst. The reaction can last from 30 minutes to several hours. What side effects may I notice from receiving this medicine? Side effects that you should report to your doctor or health care professional as soon as possible:  allergic reactions like skin rash, itching or hives, swelling of the face, lips, or tongue  breathing problems  changes in vision  fast, irregular heartbeat  high or low blood pressure  mouth sores  pain, tingling, numbness in the hands or feet  signs of decreased platelets or bleeding - bruising, pinpoint red spots on the skin, black, tarry stools, blood in the urine  signs of decreased red blood cells - unusually weak or tired, feeling faint or lightheaded, falls  signs of infection - fever or chills, cough, sore throat, pain or difficulty passing urine  signs and symptoms of liver injury like dark yellow or brown urine; general ill feeling or flu-like symptoms; light-colored stools; loss of appetite; nausea; right upper belly pain; unusually weak or tired; yellowing of the eyes or skin  swelling of the ankles, feet, hands  unusually slow heartbeat Side effects that usually do not require medical attention (report to your doctor or health care professional if they continue or are bothersome):  diarrhea  hair loss  loss of appetite  muscle or joint pain  nausea, vomiting  pain, redness, or irritation at site where injected  tiredness This list may not describe all possible side effects. Call your doctor for medical advice about side effects. You may report side effects to FDA at 1-800-FDA-1088. Where should I keep my medicine? This drug is given in a hospital or clinic and will not be stored at home. NOTE: This sheet is a summary. It may not cover all possible information. If you have  questions about this medicine, talk to your doctor, pharmacist, or health care provider.  2020 [employer] (2017-07-05 13:14:55)

## 2020-01-21 ENCOUNTER — Telehealth: Payer: Self-pay | Admitting: Physician Assistant

## 2020-01-21 NOTE — Telephone Encounter (Signed)
Kept schedule as is per 3/5 los

## 2020-01-24 NOTE — Progress Notes (Signed)
[place]   [phone] [phone]   Clinic Follow up Note   Patient Care Team: Burnett Sheng, MD as PCP - General (Family Medicine) Mauro Kaufmann, RN as Oncology Nurse Navigator Rockwell Germany, RN as Oncology Nurse Navigator Jovita Kussmaul, MD as Consulting Physician (General Surgery) Truitt Merle, MD as Consulting Physician (Hematology) Eppie Gibson, MD as Attending Physician (Radiation Oncology)  Date of Service:  02/01/2020  CHIEF COMPLAINT: F/u of right breast cancer  SUMMARY OF ONCOLOGIC HISTORY: Oncology History Overview Note  Cancer Staging Malignant neoplasm of upper-outer quadrant of right breast in female, estrogen receptor positive ([place]) Staging form: Breast, AJCC 8th Edition - Clinical stage from 08/28/2019: Stage IB (cT2, cN0, cM0, G2, ER+, PR+, HER2-) - Signed by Truitt Merle, MD on 09/04/2019 - Pathologic stage from 09/24/2019: Stage IB (pT2, pN1a, cM0, G2, ER+, PR+, HER2-) - Signed by Truitt Merle, MD on 10/10/2019    Malignant neoplasm of upper-outer quadrant of right breast in female, estrogen receptor positive ([place])  08/24/2019 Mammogram   Diagnostic mammogram and [place] 08/24/19 IMPRESSION: Suspicious right breast mass 10 o'clock 1 cm from the nipple measuring 3.3 x 1.9 x 1.9 cm and axillary adenopathy.   08/28/2019 Initial Biopsy   Diagnosis 08/28/19 1. Breast, right, needle core biopsy, 10 o'clock - INVASIVE MAMMARY CARCINOMA, GRADE II. - SEE MICROSCOPIC DESCRIPTION. 2. Lymph node, needle/core biopsy, right axilla - BENIGN LYMPH NODE. - NO METASTATIC CARCINOMA IDENTIFIED.    08/28/2019 Receptors her2   Results: IMMUNOHISTOCHEMICAL AND MORPHOMETRIC ANALYSIS PERFORMED MANUALLY The tumor cells are NEGATIVE for Her2 (1+). Estrogen Receptor: 100%, POSITIVE, STRONG STAINING INTENSITY Progesterone Receptor: 100%, POSITIVE, STRONG STAINING INTENSITY Proliferation Marker Ki67: 10%   08/28/2019 Cancer Staging   Staging  form: Breast, AJCC 8th Edition - Clinical stage from 08/28/2019: Stage IB (cT2, cN0, cM0, G2, ER+, PR+, HER2-) - Signed by Truitt Merle, MD on 09/04/2019   08/31/2019 Initial Diagnosis   Malignant neoplasm of upper-outer quadrant of right breast in female, estrogen receptor positive ([place])   09/07/2019 Breast MRI   Breast MRI 09/07/19  IMPRESSION: 1. 2.5 cm known malignancy in the anterior right breast, superficial depth near the nipple. 2. 7-8 mm indeterminate mass in the central upper outer quadrant of the right breast. 3. One right axillary lymph node with apparent mild cortical thickening, which lies slightly anterior and inferior to the biopsied right axillary lymph node ( benign reactive on pathology). This is most likely also benign given the pathology from the adjacent biopsied lymph node. 4. No evidence of left breast malignancy.   09/17/2019 Imaging   [place] right breast 09/17/19  IMPRESSION: Several benign appearing masses in the upper outer quadrant of the right breast without a definite correlate to the finding seen on prior MRI. The right axillary lymph nodes appear similar to the recently biopsied benign lymph node.   09/20/2019 Pathology Results   Diagnosis 09/20/19  Breast, right, needle core biopsy, upper outer quadrant - FIBROADENOMA - NO MALIGNANCY IDENTIFIED    09/21/2019 Genetic Testing   Negative genetic testing:  No pathogenic variants detected on the [facility] Breast Cancer STAT panel and Common Hereditary Cancers panel.  A variant of uncertain significance was identified in the BRCA1 gene, called c.2048A>G ([id number]).  The report date is 09/21/2019.  The STAT Breast cancer panel offered by [facility] includes sequencing and rearrangement analysis for the following 9 genes:  ATM, BRCA1, BRCA2, CDH1, CHEK2, PALB2, PTEN, STK11 and TP53.   The Common  Hereditary Cancers Panel offered by [facility] includes sequencing and/or deletion duplication testing of the following 48  genes: APC, ATM, AXIN2, BARD1, BMPR1A, BRCA1, BRCA2, BRIP1, CDH1, CDK4, CDKN2A (p14ARF), CDKN2A (p16INK4a), CHEK2, CTNNA1, DICER1, EPCAM (Deletion/duplication testing only), GREM1 (promoter region deletion/duplication testing only), KIT, MEN1, MLH1, MSH2, MSH3, MSH6, MUTYH, NBN, NF1, NHTL1, PALB2, PDGFRA, PMS2, POLD1, POLE, PTEN, RAD50, RAD51C, RAD51D, RNF43, SDHB, SDHC, SDHD, SMAD4, SMARCA4. STK11, TP53, TSC1, TSC2, and VHL.  The following genes were evaluated for sequence changes only: SDHA and HOXB13 c.251G>A variant only.    09/24/2019 Surgery   RIGHT BREAST LUMPECTOMY WITH SENTINEL LYMPH NODE BIOPSY by Dr Toth  09/24/19    09/24/2019 Pathology Results   FINAL MICROSCOPIC DIAGNOSIS: 09/24/19   A. LYMPH NODE, RIGHT #1, SENTINEL, BIOPSY:  - There is no evidence of carcinoma in 1 of 1 lymph node (0/1).   B. LYMPH NODE, RIGHT, SENTINEL, BIOPSY:  - There is no evidence of carcinoma in 1 of 1 lymph node (0/1).   C. 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   D. LYMPH NODE, RIGHT #2, SENTINEL, BIOPSY:  - There is no evidence of carcinoma in 1 of 1 lymph node (0/1).   E. LYMPH NODE, RIGHT #3 , SENTINEL, BIOPSY:  - There is no evidence of carcinoma in 1 of 1 lymph node (0/1).   F. LYMPH NODE, RIGHT, SENTINEL, BIOPSY:  - There is no evidence of carcinoma in 1 of 1 lymph node (0/1).   G. LYMPH NODE, RIGHT, SENTINEL, BIOPSY:  -There is no evidence of carcinoma in 1 of 1 lymph node (0/1).   H. 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  - Ductal carcinoma in situ, intermediate grade.  - Perineural invasion is identified.  - The surgical resection margins are negative for carcinoma.  - See oncology table below   I. BREAST, RIGHT ADDITIONAL INFERIOR MARGIN, EXCISION:  - Fibrocystic changes.  - There is no evidence of malignancy.     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 Postoperative findings in the right breast and axilla. Upper normal sized right axillary lymph nodes, nonspecific. 2. 2 mm superior apical segment right upper lobe nodule is technically nonspecific although statistically likely to be benign. 3. 3.6 cm hypodense right adnexal structure, probably a cyst but technically nonspecific. If patient is premenopausal, no follow up is warranted; if early postmenopausal, follow up ultrasound in 6-12 months would be recommended. 4. Other imaging findings of potential clinical significance: Small type 1 hiatal hernia. Descending and sigmoid colon diverticulosis. Small indirect left inguinal hernia contains adipose tissue.   Aortic Atherosclerosis (ICD10-I70.0).   10/26/2019 Imaging   Whole Body Bone scan 10/26/19  IMPRESSION: Nonspecific sites of uptake at the RIGHT ischium and anterior   LEFT sixth rib, cannot exclude metastatic foci.   Otherwise negative exam.     10/29/2019 -  Chemotherapy   Adjuvant AC q2weeks for 4 cycles starting 10/29/19-12/21/19 followed by weekly Taxol for 12 weeks starting 01/11/20.       CURRENT THERAPY:  Adjuvant AC q2weeks for 4 cycles starting 10/29/19-2/5/21followed by weekly Taxol for 12 weeks starting 01/11/20.   INTERVAL HISTORY:  Paula Davidson is here for a follow up and treatment. She presents to the clinic alone.  She notes she is not sleeping well. She notes last night she got 3 hours of sleep. Last night she notes she could not fall asleep. She notes she had sleeping problems before her cancer diagnosis. She notes then Melatonin did not work. She notes with Ativan now she can get 6 hours of sleep. Amitriptyline takes too long to start working. She notes her chemo is stressfully.     REVIEW OF SYSTEMS:   Constitutional: Denies fevers, chills or abnormal weight loss (+) insomnia/trouble sleeping  Eyes: Denies blurriness of vision Ears, nose, mouth, throat, and face: Denies mucositis or sore throat Respiratory: Denies cough, dyspnea or wheezes Cardiovascular: Denies palpitation, chest discomfort or lower extremity swelling Gastrointestinal:  Denies nausea, heartburn or change in bowel habits Skin: Denies abnormal skin rashes Lymphatics: Denies new lymphadenopathy or easy bruising Neurological:Denies numbness, tingling or new weaknesses Behavioral/Psych: Mood is stable, no new changes  All other systems were reviewed with the patient and are negative.  MEDICAL HISTORY:  Past Medical History:  Diagnosis Date  . Anxiety   . Cancer ([place]) 09/24/2019   right breast cancer-had lumpectomy  . Depression   . DUB (dysfunctional uterine bleeding)   . Family history of pancreatic cancer   . Hypertension     SURGICAL HISTORY: Past Surgical History:  Procedure Laterality Date  . BREAST LUMPECTOMY WITH AXILLARY LYMPH NODE BIOPSY Right 09/24/2019   Procedure: RIGHT BREAST LUMPECTOMY WITH SENTINEL LYMPH NODE BIOPSY;  Surgeon: Jovita Kussmaul, MD;  Location: [facility];  Service: General;  Laterality: Right;  . BREAST SURGERY  09/24/2019   Breast lump excised  . FOOT SURGERY    . LAPAROSCOPIC GASTRIC BANDING    . PORTACATH PLACEMENT N/A 10/25/2019   Procedure: INSERTION PORT-A-CATH WITH POSSIBLE ULTRASOUND;  Surgeon: Jovita Kussmaul, MD;  Location: [facility];  Service: General;  Laterality: N/A;   . WISDOM TOOTH EXTRACTION      I have reviewed the social history and family history with the patient and they are unchanged from previous note.  ALLERGIES:  is allergic to hydrochlorothiazide; codeine; and lisinopril.  MEDICATIONS:  Current Outpatient Medications  Medication Sig Dispense Refill  . amLODipine (NORVASC) 5 MG tablet Take 5 mg by mouth daily.    Marland Kitchen Bioflavonoid Products (ESTER C PO) Take 1 tablet by mouth daily.    . Calcium Carbonate-Vitamin D (CALCIUM + D PO) Take 1 tablet by mouth daily.     . cetirizine (ZYRTEC) 10 MG tablet Take 10 mg by mouth daily as needed for allergies.    . cholecalciferol (VITAMIN D) 25 MCG (1000 UT) tablet Take 1,000 Units by mouth daily.    . citalopram (CELEXA) 20 MG tablet Take 20 mg by mouth daily.    Marland Kitchen HYDROcodone-acetaminophen (NORCO/VICODIN) 5-325 MG tablet Take 1-2 tablets by mouth every 6 (six) hours as needed for moderate pain or severe pain. 10 tablet 0  . lidocaine-prilocaine (EMLA) cream Apply to affected area once 30 g 3  .  LORazepam (ATIVAN) 0.5 MG tablet Take 1-2 tablets (0.5-1 mg total) by mouth every 8 (eight) hours as needed for anxiety (nausea). 20 tablet 0  . losartan (COZAAR) 100 MG tablet Take 100 mg by mouth daily.    . Multiple Vitamin (MULTIVITAMIN WITH MINERALS) TABS tablet Take 1 tablet by mouth daily.    . ondansetron (ZOFRAN) 8 MG tablet Take 1 tablet (8 mg total) by mouth 2 (two) times daily as needed. Start on the third day after chemotherapy. 30 tablet 1  . prochlorperazine (COMPAZINE) 10 MG tablet Take 1 tablet (10 mg total) by mouth every 6 (six) hours as needed (Nausea or vomiting). 30 tablet 1  . promethazine (PHENERGAN) 25 MG tablet Take 1 tablet (25 mg total) by mouth every 6 (six) hours as needed for nausea or vomiting. 30 tablet 0  . traZODone (DESYREL) 50 MG tablet Take 1 tablet (50 mg total) by mouth at bedtime. 15 tablet 0   No current facility-administered medications for this visit.    Facility-Administered Medications Ordered in Other Visits  Medication Dose Route Frequency Provider Last Rate Last Admin  . sodium chloride flush (NS) 0.9 % injection 10 mL  10 mL Intracatheter PRN Truitt Merle, MD   10 mL at 02/01/20 1131    PHYSICAL EXAMINATION: ECOG PERFORMANCE STATUS: 1 - Symptomatic but completely ambulatory  Vitals:   02/01/20 0839  BP: 136/83  Pulse: 76  Temp: 98.5 F (36.9 C)  SpO2: 100%   Filed Weights   02/01/20 0839  Weight: 283 lb 9.6 oz (128.6 kg)    GENERAL:alert, no distress and comfortable SKIN: skin color, texture, turgor are normal, no rashes or significant lesions EYES: normal, Conjunctiva are pink and non-injected, sclera clear  NECK: supple, thyroid normal size, non-tender, without nodularity LYMPH:  no palpable lymphadenopathy in the cervical, axillary  LUNGS: clear to auscultation and percussion with normal breathing effort HEART: regular rate & rhythm and no murmurs and no lower extremity edema ABDOMEN:abdomen soft, non-tender and normal bowel sounds Musculoskeletal:no cyanosis of digits and no clubbing  NEURO: alert & oriented x 3 with fluent speech, no focal motor/sensory deficits BREAST: S/p right lumpectomy, healed well. No palpable mass, nodules or adenopathy bilaterally. Breast exam benign.   LABORATORY DATA:  I have reviewed the data as listed CBC Latest Ref Rng & Units 02/01/2020 01/25/2020 01/18/2020  WBC 4.0 - 10.5 K/uL 5.3 5.7 5.4  Hemoglobin 12.0 - 15.0 g/dL 9.2(L) 9.1(L) 9.0(L)  Hematocrit 36.0 - 46.0 % 28.7(L) 28.4(L) 28.6(L)  Platelets 150 - 400 K/uL 385 352 379     CMP Latest Ref Rng & Units 02/01/2020 01/25/2020 01/18/2020  Glucose 70 - 99 mg/dL 97 115(H) 106(H)  BUN 6 - 20 mg/dL '6 6 8  '$ Creatinine 0.44 - 1.00 mg/dL 0.68 0.69 0.67  Sodium 135 - 145 mmol/L 140 141 138  Potassium 3.5 - 5.1 mmol/L 4.1 3.5 4.0  Chloride 98 - 111 mmol/L 107 107 108  CO2 22 - 32 mmol/L '25 25 25  '$ Calcium 8.9 - 10.3 mg/dL 9.3 9.3 9.2  Total  Protein 6.5 - 8.1 g/dL 7.2 7.0 7.1  Total Bilirubin 0.3 - 1.2 mg/dL 0.4 0.3 0.3  Alkaline Phos 38 - 126 U/L 55 55 54  AST 15 - 41 U/L '20 24 18  '$ ALT 0 - 44 U/L 29 34 30      RADIOGRAPHIC STUDIES: I have personally reviewed the radiological images as listed and agreed with the findings in the report. No results  found.   ASSESSMENT & PLAN:  Ellasyn Swilling is a 49 y.o. 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 Shehas had a right breast mass for 7-8 yearsand recently grew.Her [place] and Biopsy show grade II invasive mammary carcinoma of right breast and lymph node negative.MRI showed tumor 2.5cm, 1 positive axillary LN and a 7-33m indeterminate mass that biopsies and found to be Fibroadenoma.  -She underwent right lumpectomy and SLNB with Dr. TMarlou Starkson 09/24/19.Her mammaprint showedHigh Risk Luminal Type B with 29% risk of recurrence in 10 years. -To reduce her high risk adjuvant chemoI started her on adjuvantIV chemoAC q2weeks for 4 cycles12/14/20-2/5/21with C4 at 25% followed by weekly Taxol for 12 weeks starting 01/11/20.  -After chemo she will proceed with Adjuvant Radiation and antiestrogen therapy to further reduce her risk of recurrence.   -S/p week 3 Taxol she continue to tolerate treatment well. She does note chemo causes stress and exacerbated her insomnia. I reviewed medications and use.  -Physical exam unremarkable. Lab reviewed, with stable anemia. Overall labs adequate to proceed with week 4 Taxol today.  I encouraged her to continue to use Ice bags and watch for neuropathy.  -She has participated in our observational UPBEAT clinical trail, no AEs. -F/u in 2 weeks    2. Genetic Testingnegative for pathogenetic mutations -She does haveVUS of BRCA1 c.2048A>G  3. Menorrhagia  -Shehassevere menorrhagia with long term bleeding. Shepreviouslyrequired IV  iron.Her Gyn started Mirena 5 years ago which stopped her periods.  -She had Mirena removed in 08/2019. Her period has not returned yet. -Given her age she may start menopause soon. Will test her hormonal level in the near futureif she has no menstrual perioddoes not return.  -She has h/o of ovarian cyst. She currently has one of right ovary as seen on 10/26/19 CT scan. Will f/u with Gyn. -She has not had period on chemo so far.   4. HTN, Depression, Anxiety, obesity -On Amlodipine, Losartan, Lopressorand Celexa -She notes she lives with rDiamond  5. Anemia  -Has been secondary to menorrhagia in the past, now secondary to chemo  -Has been slowly trending down. Hg stable lately.   6. Insomnia  -She had trouble sleeping prior to her cancer diagnosis.  -She notes she has tried Melatonin that has not helped and Amitriptyline takes too long to work  -She has been using Ativan as needed which can get her 6 hours. I recommend she try Trazodone nightly as needed, side effects reviewed. She is agreeable. If not enough we can try Ambien.  -I advised her to not take Celexa and Trazodone at the same time given small drug interaction. She understands.    PLAN: -I called in EMLA cream and Trazodone today  -Labs reviewed and adequate to proceed with week 4 Taxol today, she will do cryotherapy  -Lab, flush, chemo Taxol in 1, 2, 3, 4 weeks  -F/u in 2 and 4 weeks    No problem-specific Assessment & Plan notes found for this encounter.   No orders of the defined types were placed in this encounter.  All questions were answered. The patient knows to call the clinic with any problems, questions or concerns. No barriers to learning was detected. The total time spent in the appointment was 30 minutes.     YTruitt Merle MD 02/01/2020   I, AJoslyn Devon am acting as scribe for YTruitt Merle MD.  I have reviewed the above documentation for accuracy and completeness, and  I agree with the above.

## 2020-01-25 ENCOUNTER — Inpatient Hospital Stay: Payer: BLUE CROSS/BLUE SHIELD

## 2020-01-25 ENCOUNTER — Other Ambulatory Visit: Payer: Self-pay

## 2020-01-25 VITALS — BP 142/76 | HR 84 | Temp 98.3°F | Resp 19 | Wt 283.2 lb

## 2020-01-25 DIAGNOSIS — Z170 Estrogen receptor positive status [ER+]: Secondary | ICD-10-CM

## 2020-01-25 DIAGNOSIS — C50411 Malignant neoplasm of upper-outer quadrant of right female breast: Secondary | ICD-10-CM

## 2020-01-25 LAB — CBC WITH DIFFERENTIAL (CANCER CENTER ONLY)
Abs Immature Granulocytes: 0.04 10*3/uL (ref 0.00–0.07)
Basophils Absolute: 0.1 10*3/uL (ref 0.0–0.1)
Basophils Relative: 1 %
Eosinophils Absolute: 0.2 10*3/uL (ref 0.0–0.5)
Eosinophils Relative: 3 %
HCT: 28.4 % — ABNORMAL LOW (ref 36.0–46.0)
Hemoglobin: 9.1 g/dL — ABNORMAL LOW (ref 12.0–15.0)
Immature Granulocytes: 1 %
Lymphocytes Relative: 19 %
Lymphs Abs: 1.1 10*3/uL (ref 0.7–4.0)
MCH: 27.6 pg (ref 26.0–34.0)
MCHC: 32 g/dL (ref 30.0–36.0)
MCV: 86.1 fL (ref 80.0–100.0)
Monocytes Absolute: 0.4 10*3/uL (ref 0.1–1.0)
Monocytes Relative: 6 %
Neutro Abs: 4.1 10*3/uL (ref 1.7–7.7)
Neutrophils Relative %: 70 %
Platelet Count: 352 10*3/uL (ref 150–400)
RBC: 3.3 MIL/uL — ABNORMAL LOW (ref 3.87–5.11)
RDW: 19.6 % — ABNORMAL HIGH (ref 11.5–15.5)
WBC Count: 5.7 10*3/uL (ref 4.0–10.5)
nRBC: 0.4 % — ABNORMAL HIGH (ref 0.0–0.2)

## 2020-01-25 LAB — CMP (CANCER CENTER ONLY)
ALT: 34 U/L (ref 0–44)
AST: 24 U/L (ref 15–41)
Albumin: 3.4 g/dL — ABNORMAL LOW (ref 3.5–5.0)
Alkaline Phosphatase: 55 U/L (ref 38–126)
Anion gap: 9 (ref 5–15)
BUN: 6 mg/dL (ref 6–20)
CO2: 25 mmol/L (ref 22–32)
Calcium: 9.3 mg/dL (ref 8.9–10.3)
Chloride: 107 mmol/L (ref 98–111)
Creatinine: 0.69 mg/dL (ref 0.44–1.00)
GFR, Est AFR Am: >60 mL/min (ref >60)
GFR, Estimated: >60 mL/min (ref >60)
Glucose, Bld: 115 mg/dL — ABNORMAL HIGH (ref 70–99)
Potassium: 3.5 mmol/L (ref 3.5–5.1)
Sodium: 141 mmol/L (ref 135–145)
Total Bilirubin: 0.3 mg/dL (ref 0.3–1.2)
Total Protein: 7 g/dL (ref 6.5–8.1)

## 2020-01-25 MED ORDER — DIPHENHYDRAMINE HCL 50 MG/ML IJ SOLN
25.0000 mg | Freq: Once | INTRAMUSCULAR | Status: AC
  Administered 2020-01-25: 25 mg via INTRAVENOUS

## 2020-01-25 MED ORDER — DIPHENHYDRAMINE HCL 50 MG/ML IJ SOLN
INTRAMUSCULAR | Status: AC
  Filled 2020-01-25: qty 1

## 2020-01-25 MED ORDER — SODIUM CHLORIDE 0.9 % IV SOLN
80.0000 mg/m2 | Freq: Once | INTRAVENOUS | Status: AC
  Administered 2020-01-25: 198 mg via INTRAVENOUS
  Filled 2020-01-25: qty 33

## 2020-01-25 MED ORDER — SODIUM CHLORIDE 0.9% FLUSH
10.0000 mL | INTRAVENOUS | Status: DC | PRN
  Administered 2020-01-25: 10 mL
  Filled 2020-01-25: qty 10

## 2020-01-25 MED ORDER — HEPARIN SOD (PORK) LOCK FLUSH 100 UNIT/ML IV SOLN
500.0000 [IU] | Freq: Once | INTRAVENOUS | Status: AC | PRN
  Administered 2020-01-25: 500 [IU]
  Filled 2020-01-25: qty 5

## 2020-01-25 MED ORDER — SODIUM CHLORIDE 0.9 % IV SOLN
Freq: Once | INTRAVENOUS | Status: AC
  Filled 2020-01-25: qty 250

## 2020-01-25 MED ORDER — DEXAMETHASONE SODIUM PHOSPHATE 10 MG/ML IJ SOLN
10.0000 mg | Freq: Once | INTRAMUSCULAR | Status: AC
  Administered 2020-01-25: 10 mg via INTRAVENOUS

## 2020-01-25 MED ORDER — FAMOTIDINE IN NACL 20-0.9 MG/50ML-% IV SOLN
20.0000 mg | Freq: Once | INTRAVENOUS | Status: AC
  Administered 2020-01-25: 20 mg via INTRAVENOUS

## 2020-01-25 MED ORDER — FAMOTIDINE IN NACL 20-0.9 MG/50ML-% IV SOLN
INTRAVENOUS | Status: AC
  Filled 2020-01-25: qty 50

## 2020-01-25 MED ORDER — DEXAMETHASONE SODIUM PHOSPHATE 10 MG/ML IJ SOLN
INTRAMUSCULAR | Status: AC
  Filled 2020-01-25: qty 1

## 2020-01-25 NOTE — Patient Instructions (Signed)
[facility] Discharge Instructions for Patients Receiving Chemotherapy  Today you received the following chemotherapy agents:  Taxol.  To help prevent nausea and vomiting after your treatment, we encourage you to take your nausea medication as directed.   If you develop nausea and vomiting that is not controlled by your nausea medication, call the clinic.   BELOW ARE SYMPTOMS THAT SHOULD BE REPORTED IMMEDIATELY:  *FEVER GREATER THAN 100.5 F  *CHILLS WITH OR WITHOUT FEVER  NAUSEA AND VOMITING THAT IS NOT CONTROLLED WITH YOUR NAUSEA MEDICATION  *UNUSUAL SHORTNESS OF BREATH  *UNUSUAL BRUISING OR BLEEDING  TENDERNESS IN MOUTH AND THROAT WITH OR WITHOUT PRESENCE OF ULCERS  *URINARY PROBLEMS  *BOWEL PROBLEMS  UNUSUAL RASH Items with * indicate a potential emergency and should be followed up as soon as possible.  Feel free to call the clinic should you have any questions or concerns. The clinic phone number is [phone].  Please show the CHEMO ALERT CARD at check-in to the Emergency Department and triage nurse.   

## 2020-02-01 ENCOUNTER — Inpatient Hospital Stay (HOSPITAL_BASED_OUTPATIENT_CLINIC_OR_DEPARTMENT_OTHER): Payer: BLUE CROSS/BLUE SHIELD | Admitting: Hematology

## 2020-02-01 ENCOUNTER — Encounter: Payer: Self-pay | Admitting: *Deleted

## 2020-02-01 ENCOUNTER — Inpatient Hospital Stay: Payer: BLUE CROSS/BLUE SHIELD

## 2020-02-01 ENCOUNTER — Encounter: Payer: Self-pay | Admitting: Hematology

## 2020-02-01 ENCOUNTER — Other Ambulatory Visit: Payer: Self-pay

## 2020-02-01 VITALS — BP 136/83 | HR 76 | Temp 98.5°F | Ht 66.0 in | Wt 283.6 lb

## 2020-02-01 DIAGNOSIS — C50411 Malignant neoplasm of upper-outer quadrant of right female breast: Secondary | ICD-10-CM

## 2020-02-01 DIAGNOSIS — I10 Essential (primary) hypertension: Secondary | ICD-10-CM | POA: Diagnosis not present

## 2020-02-01 DIAGNOSIS — Z170 Estrogen receptor positive status [ER+]: Secondary | ICD-10-CM

## 2020-02-01 DIAGNOSIS — Z95828 Presence of other vascular implants and grafts: Secondary | ICD-10-CM

## 2020-02-01 LAB — CMP (CANCER CENTER ONLY)
ALT: 29 U/L (ref 0–44)
AST: 20 U/L (ref 15–41)
Albumin: 3.5 g/dL (ref 3.5–5.0)
Alkaline Phosphatase: 55 U/L (ref 38–126)
Anion gap: 8 (ref 5–15)
BUN: 6 mg/dL (ref 6–20)
CO2: 25 mmol/L (ref 22–32)
Calcium: 9.3 mg/dL (ref 8.9–10.3)
Chloride: 107 mmol/L (ref 98–111)
Creatinine: 0.68 mg/dL (ref 0.44–1.00)
GFR, Est AFR Am: >60 mL/min (ref >60)
GFR, Estimated: >60 mL/min (ref >60)
Glucose, Bld: 97 mg/dL (ref 70–99)
Potassium: 4.1 mmol/L (ref 3.5–5.1)
Sodium: 140 mmol/L (ref 135–145)
Total Bilirubin: 0.4 mg/dL (ref 0.3–1.2)
Total Protein: 7.2 g/dL (ref 6.5–8.1)

## 2020-02-01 LAB — CBC WITH DIFFERENTIAL (CANCER CENTER ONLY)
Abs Immature Granulocytes: 0.03 10*3/uL (ref 0.00–0.07)
Basophils Absolute: 0 10*3/uL (ref 0.0–0.1)
Basophils Relative: 1 %
Eosinophils Absolute: 0.2 10*3/uL (ref 0.0–0.5)
Eosinophils Relative: 3 %
HCT: 28.7 % — ABNORMAL LOW (ref 36.0–46.0)
Hemoglobin: 9.2 g/dL — ABNORMAL LOW (ref 12.0–15.0)
Immature Granulocytes: 1 %
Lymphocytes Relative: 20 %
Lymphs Abs: 1.1 10*3/uL (ref 0.7–4.0)
MCH: 27.7 pg (ref 26.0–34.0)
MCHC: 32.1 g/dL (ref 30.0–36.0)
MCV: 86.4 fL (ref 80.0–100.0)
Monocytes Absolute: 0.3 10*3/uL (ref 0.1–1.0)
Monocytes Relative: 7 %
Neutro Abs: 3.6 10*3/uL (ref 1.7–7.7)
Neutrophils Relative %: 68 %
Platelet Count: 385 10*3/uL (ref 150–400)
RBC: 3.32 MIL/uL — ABNORMAL LOW (ref 3.87–5.11)
RDW: 19.7 % — ABNORMAL HIGH (ref 11.5–15.5)
WBC Count: 5.3 10*3/uL (ref 4.0–10.5)
nRBC: 0 % (ref 0.0–0.2)

## 2020-02-01 MED ORDER — FAMOTIDINE IN NACL 20-0.9 MG/50ML-% IV SOLN
20.0000 mg | Freq: Once | INTRAVENOUS | Status: AC
  Administered 2020-02-01: 20 mg via INTRAVENOUS

## 2020-02-01 MED ORDER — HEPARIN SOD (PORK) LOCK FLUSH 100 UNIT/ML IV SOLN
500.0000 [IU] | Freq: Once | INTRAVENOUS | Status: AC | PRN
  Administered 2020-02-01: 500 [IU]
  Filled 2020-02-01: qty 5

## 2020-02-01 MED ORDER — SODIUM CHLORIDE 0.9 % IV SOLN
Freq: Once | INTRAVENOUS | Status: AC
  Filled 2020-02-01: qty 250

## 2020-02-01 MED ORDER — DEXAMETHASONE SODIUM PHOSPHATE 10 MG/ML IJ SOLN
INTRAMUSCULAR | Status: AC
  Filled 2020-02-01: qty 1

## 2020-02-01 MED ORDER — TRAZODONE HCL 50 MG PO TABS: 50.0000 mg | ORAL_TABLET | Freq: Every day | ORAL | 0 refills | Status: DC

## 2020-02-01 MED ORDER — SODIUM CHLORIDE 0.9 % IV SOLN
80.0000 mg/m2 | Freq: Once | INTRAVENOUS | Status: AC
  Administered 2020-02-01: 198 mg via INTRAVENOUS
  Filled 2020-02-01: qty 33

## 2020-02-01 MED ORDER — SODIUM CHLORIDE 0.9% FLUSH
10.0000 mL | INTRAVENOUS | Status: DC | PRN
  Administered 2020-02-01: 10 mL via INTRAVENOUS
  Filled 2020-02-01: qty 10

## 2020-02-01 MED ORDER — DEXAMETHASONE SODIUM PHOSPHATE 10 MG/ML IJ SOLN
10.0000 mg | Freq: Once | INTRAMUSCULAR | Status: AC
  Administered 2020-02-01: 10 mg via INTRAVENOUS

## 2020-02-01 MED ORDER — LIDOCAINE-PRILOCAINE 2.5-2.5 % EX CREA: TOPICAL_CREAM | CUTANEOUS | 3 refills | Status: DC

## 2020-02-01 MED ORDER — FAMOTIDINE IN NACL 20-0.9 MG/50ML-% IV SOLN
INTRAVENOUS | Status: AC
  Filled 2020-02-01: qty 50

## 2020-02-01 MED ORDER — SODIUM CHLORIDE 0.9% FLUSH
10.0000 mL | INTRAVENOUS | Status: DC | PRN
  Administered 2020-02-01: 12:00:00 10 mL
  Filled 2020-02-01: qty 10

## 2020-02-01 MED ORDER — DIPHENHYDRAMINE HCL 50 MG/ML IJ SOLN
INTRAMUSCULAR | Status: AC
  Filled 2020-02-01: qty 1

## 2020-02-01 MED ORDER — HEPARIN SOD (PORK) LOCK FLUSH 100 UNIT/ML IV SOLN
500.0000 [IU] | Freq: Once | INTRAVENOUS | Status: DC
  Filled 2020-02-01: qty 5

## 2020-02-01 MED ORDER — DIPHENHYDRAMINE HCL 50 MG/ML IJ SOLN
25.0000 mg | Freq: Once | INTRAMUSCULAR | Status: AC
  Administered 2020-02-01: 25 mg via INTRAVENOUS

## 2020-02-01 NOTE — Patient Instructions (Signed)
[facility] Discharge Instructions for Patients Receiving Chemotherapy  Today you received the following chemotherapy agents: paclitaxel.  To help prevent nausea and vomiting after your treatment, we encourage you to take your nausea medication as directed.   If you develop nausea and vomiting that is not controlled by your nausea medication, call the clinic.   BELOW ARE SYMPTOMS THAT SHOULD BE REPORTED IMMEDIATELY:  *FEVER GREATER THAN 100.5 F  *CHILLS WITH OR WITHOUT FEVER  NAUSEA AND VOMITING THAT IS NOT CONTROLLED WITH YOUR NAUSEA MEDICATION  *UNUSUAL SHORTNESS OF BREATH  *UNUSUAL BRUISING OR BLEEDING  TENDERNESS IN MOUTH AND THROAT WITH OR WITHOUT PRESENCE OF ULCERS  *URINARY PROBLEMS  *BOWEL PROBLEMS  UNUSUAL RASH Items with * indicate a potential emergency and should be followed up as soon as possible.  Feel free to call the clinic should you have any questions or concerns. The clinic phone number is [phone].  Please show the CHEMO ALERT CARD at check-in to the Emergency Department and triage nurse.   

## 2020-02-04 NOTE — Progress Notes (Signed)
[name]   Telephone:([phone]) [phone] [phone]) [phone]   Clinic Follow up Note   Patient Care Team: Burnett Sheng, MD as PCP - General (Family Medicine) Mauro Kaufmann, RN as Oncology Nurse Navigator Rockwell Germany, RN as Oncology Nurse Navigator Jovita Kussmaul, MD as Consulting Physician (General Surgery) Truitt Merle, MD as Consulting Physician (Hematology) Eppie Gibson, MD as Attending Physician (Radiation Oncology)  Date of Service:  02/15/2020  CHIEF COMPLAINT: F/u of right breast cancer  SUMMARY OF ONCOLOGIC HISTORY: Oncology History Overview Note  Cancer Staging Malignant neoplasm of upper-outer quadrant of right breast in female, estrogen receptor positive ([name]) Staging form: Breast, AJCC 8th Edition - Clinical stage from 08/28/2019: Stage IB (cT2, cN0, cM0, G2, ER+, PR+, HER2-) - Signed by Truitt Merle, MD on 09/04/2019 - Pathologic stage from 09/24/2019: Stage IB (pT2, pN1a, cM0, G2, ER+, PR+, HER2-) - Signed by Truitt Merle, MD on 10/10/2019    Malignant neoplasm of upper-outer quadrant of right breast in female, estrogen receptor positive ([name])  08/24/2019 Mammogram   Diagnostic mammogram and [place] 08/24/19 IMPRESSION: Suspicious right breast mass 10 o'clock 1 cm from the nipple measuring 3.3 x 1.9 x 1.9 cm and axillary adenopathy.   08/28/2019 Initial Biopsy   Diagnosis 08/28/19 1. Breast, right, needle core biopsy, 10 o'clock - INVASIVE MAMMARY CARCINOMA, GRADE II. - SEE MICROSCOPIC DESCRIPTION. 2. Lymph node, needle/core biopsy, right axilla - BENIGN LYMPH NODE. - NO METASTATIC CARCINOMA IDENTIFIED.    08/28/2019 Receptors her2   Results: IMMUNOHISTOCHEMICAL AND MORPHOMETRIC ANALYSIS PERFORMED MANUALLY The tumor cells are NEGATIVE for Her2 (1+). Estrogen Receptor: 100%, POSITIVE, STRONG STAINING INTENSITY Progesterone Receptor: 100%, POSITIVE, STRONG STAINING INTENSITY Proliferation Marker Ki67: 10%   08/28/2019 Cancer Staging   Staging form:  Breast, AJCC 8th Edition - Clinical stage from 08/28/2019: Stage IB (cT2, cN0, cM0, G2, ER+, PR+, HER2-) - Signed by Truitt Merle, MD on 09/04/2019   08/31/2019 Initial Diagnosis   Malignant neoplasm of upper-outer quadrant of right breast in female, estrogen receptor positive ([name])   09/07/2019 Breast MRI   Breast MRI 09/07/19  IMPRESSION: 1. 2.5 cm known malignancy in the anterior right breast, superficial depth near the nipple. 2. 7-8 mm indeterminate mass in the central upper outer quadrant of the right breast. 3. One right axillary lymph node with apparent mild cortical thickening, which lies slightly anterior and inferior to the biopsied right axillary lymph node ( benign reactive on pathology). This is most likely also benign given the pathology from the adjacent biopsied lymph node. 4. No evidence of left breast malignancy.   09/17/2019 Imaging   [place] right breast 09/17/19  IMPRESSION: Several benign appearing masses in the upper outer quadrant of the right breast without a definite correlate to the finding seen on prior MRI. The right axillary lymph nodes appear similar to the recently biopsied benign lymph node.   09/20/2019 Pathology Results   Diagnosis 09/20/19  Breast, right, needle core biopsy, upper outer quadrant - FIBROADENOMA - NO MALIGNANCY IDENTIFIED    09/21/2019 Genetic Testing   Negative genetic testing:  No pathogenic variants detected on the [facility] Breast Cancer STAT panel and Common Hereditary Cancers panel.  A variant of uncertain significance was identified in the BRCA1 gene, called c.2048A>G (W.[id number]).  The report date is 09/21/2019.  The STAT Breast cancer panel offered by [facility] includes sequencing and rearrangement analysis for the following 9 genes:  ATM, BRCA1, BRCA2, CDH1, CHEK2, PALB2, PTEN, STK11 and TP53.   The Common  Hereditary Cancers Panel offered by [facility] includes sequencing and/or deletion duplication testing of the following 48 genes:  APC, ATM, AXIN2, BARD1, BMPR1A, BRCA1, BRCA2, BRIP1, CDH1, CDK4, CDKN2A (p14ARF), CDKN2A (p16INK4a), CHEK2, CTNNA1, DICER1, EPCAM (Deletion/duplication testing only), GREM1 (promoter region deletion/duplication testing only), KIT, MEN1, MLH1, MSH2, MSH3, MSH6, MUTYH, NBN, NF1, NHTL1, PALB2, PDGFRA, PMS2, POLD1, POLE, PTEN, RAD50, RAD51C, RAD51D, RNF43, SDHB, SDHC, SDHD, SMAD4, SMARCA4. STK11, TP53, TSC1, TSC2, and VHL.  The following genes were evaluated for sequence changes only: SDHA and HOXB13 c.251G>A variant only.    09/24/2019 Surgery   RIGHT BREAST LUMPECTOMY WITH SENTINEL LYMPH NODE BIOPSY by Dr Marlou Starks  09/24/19    09/24/2019 Pathology Results   FINAL MICROSCOPIC DIAGNOSIS: 09/24/19   A. LYMPH NODE, RIGHT #1, SENTINEL, BIOPSY:  - There is no evidence of carcinoma in 1 of 1 lymph node (0/1).   B. LYMPH NODE, RIGHT, SENTINEL, BIOPSY:  - There is no evidence of carcinoma in 1 of 1 lymph node (0/1).   C. 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   D. LYMPH NODE, RIGHT #2, SENTINEL, BIOPSY:  - There is no evidence of carcinoma in 1 of 1 lymph node (0/1).   E. LYMPH NODE, RIGHT #3 , SENTINEL, BIOPSY:  - There is no evidence of carcinoma in 1 of 1 lymph node (0/1).   F. LYMPH NODE, RIGHT, SENTINEL, BIOPSY:  - There is no evidence of carcinoma in 1 of 1 lymph node (0/1).   G. LYMPH NODE, RIGHT, SENTINEL, BIOPSY:  -There is no evidence of carcinoma in 1 of 1 lymph node (0/1).   H. 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  - Ductal carcinoma in situ, intermediate grade.  - Perineural invasion is identified.  - The surgical resection margins are negative for carcinoma.  - See oncology table below   I. BREAST, RIGHT ADDITIONAL INFERIOR MARGIN, EXCISION:  - Fibrocystic changes.  - There is no evidence of malignancy.     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 Postoperative findings in the right breast and axilla. Upper normal sized right axillary lymph nodes, nonspecific. 2. 2 mm superior apical segment right upper lobe nodule is technically nonspecific although statistically likely to be benign. 3. 3.6 cm hypodense right adnexal structure, probably a cyst but technically nonspecific. If patient is premenopausal, no follow up is warranted; if early postmenopausal, follow up ultrasound in 6-12 months would be recommended. 4. Other imaging findings of potential clinical significance: Small type 1 hiatal hernia. Descending and sigmoid colon diverticulosis. Small indirect left inguinal hernia contains adipose tissue.   Aortic Atherosclerosis (ICD10-I70.0).   10/26/2019 Imaging   Whole Body Bone scan 10/26/19  IMPRESSION: Nonspecific sites of uptake at the RIGHT ischium and anterior  LEFT sixth rib, cannot exclude metastatic foci.   Otherwise negative exam.     10/29/2019 -  Chemotherapy   Adjuvant AC q2weeks for 4 cycles starting 10/29/19-12/21/19 followed by weekly Taxol for 12 weeks starting 01/11/20.       CURRENT THERAPY:  Adjuvant AC q2weeks for 4 cycles starting 10/29/19-2/5/21followed by weekly Taxol for 12 weeksstarting 01/11/20.Taxol reduced 30% starting with C6  INTERVAL HISTORY:  Paula Davidson is here for a follow up and treatment. She presents  to the clinic with her mother-in-law. She notes her neuropathy has started in the tips of toes with numbness and barely and tingling. She has minimal tingling in her fingers. She notes it hurts to elevate her feet. Walking helps her numbness go away. She notes her pain is worse at night in her toes and it effects her sleep.  She notes she fell 6 days ago because she could not feel her feet when she was walking to [place]. Then 2 days later she started feeling the pain. She is concerned about dose reducing or stopping treatment.     REVIEW OF SYSTEMS:   Constitutional: Denies fevers, chills or abnormal weight loss  Eyes: Denies blurriness of vision Ears, nose, mouth, throat, and face: Denies mucositis or sore throat Respiratory: Denies cough, dyspnea or wheezes Cardiovascular: Denies palpitation, chest discomfort or lower extremity swelling Gastrointestinal:  Denies nausea, heartburn or change in bowel habits Skin: Denies abnormal skin rashes Lymphatics: Denies new lymphadenopathy or easy bruising Neurological: (+) Numbness of toes, minimal tingling of her toes and fingers.  Behavioral/Psych: Mood is stable, no new changes  All other systems were reviewed with the patient and are negative.  MEDICAL HISTORY:  Past Medical History:  Diagnosis Date  . Anxiety   . Cancer ([place]) 09/24/2019   right breast cancer-had lumpectomy  . Depression   . DUB (dysfunctional uterine bleeding)   . Family history of pancreatic cancer   . Hypertension     SURGICAL HISTORY: Past Surgical History:  Procedure Laterality Date  . BREAST LUMPECTOMY WITH AXILLARY LYMPH NODE BIOPSY Right 09/24/2019   Procedure: RIGHT BREAST LUMPECTOMY WITH SENTINEL LYMPH NODE BIOPSY;  Surgeon: Jovita Kussmaul, MD;  Location: [facility];  Service: General;  Laterality: Right;  . BREAST SURGERY  09/24/2019   Breast lump excised  . FOOT SURGERY    . LAPAROSCOPIC GASTRIC BANDING    . PORTACATH PLACEMENT N/A  10/25/2019   Procedure: INSERTION PORT-A-CATH WITH POSSIBLE ULTRASOUND;  Surgeon: Jovita Kussmaul, MD;  Location: [facility];  Service: General;  Laterality: N/A;  . WISDOM TOOTH EXTRACTION      I have reviewed the social history and family history with the patient and they are unchanged from previous note.  ALLERGIES:  is allergic to hydrochlorothiazide; codeine; and lisinopril.  MEDICATIONS:  Current Outpatient Medications  Medication Sig Dispense Refill  . amLODipine (NORVASC) 10 MG tablet Take 10 mg by mouth daily.    Marland Kitchen amLODipine (NORVASC) 5 MG tablet Take 5 mg by mouth daily.    Marland Kitchen Bioflavonoid Products (ESTER C PO) Take 1 tablet by mouth daily.    . Calcium Carbonate-Vitamin D (CALCIUM + D PO) Take 1 tablet by mouth daily.     . cetirizine (ZYRTEC) 10 MG tablet Take 10 mg by mouth daily as needed for allergies.    . cholecalciferol (VITAMIN D) 25 MCG (1000 UT) tablet Take 1,000 Units by mouth daily.    . citalopram (CELEXA) 20 MG tablet Take  20 mg by mouth daily.    Marland Kitchen gabapentin (NEURONTIN) 100 MG capsule Take 1 capsule (100 mg total) by mouth at bedtime. Increase to 2 capsule in a week, and 3 capsule in two weeks, if she tolerates well 90 capsule 1  . HYDROcodone-acetaminophen (NORCO/VICODIN) 5-325 MG tablet Take 1-2 tablets by mouth every 6 (six) hours as needed for moderate pain or severe pain. 10 tablet 0  . lidocaine-prilocaine (EMLA) cream Apply to affected area once 30 g 3  . LORazepam (ATIVAN) 0.5 MG tablet Take 1-2 tablets (0.5-1 mg total) by mouth every 8 (eight) hours as needed for anxiety (nausea). 20 tablet 0  . losartan (COZAAR) 100 MG tablet Take 100 mg by mouth daily.    . Multiple Vitamin (MULTIVITAMIN WITH MINERALS) TABS tablet Take 1 tablet by mouth daily.    . ondansetron (ZOFRAN) 8 MG tablet Take 1 tablet (8 mg total) by mouth 2 (two) times daily as needed. Start on the third day after chemotherapy. 30 tablet 1  . prochlorperazine (COMPAZINE) 10 MG tablet Take 1 tablet  (10 mg total) by mouth every 6 (six) hours as needed (Nausea or vomiting). 30 tablet 1  . promethazine (PHENERGAN) 25 MG tablet Take 1 tablet (25 mg total) by mouth every 6 (six) hours as needed for nausea or vomiting. 30 tablet 0  . traZODone (DESYREL) 50 MG tablet Take 1 tablet (50 mg total) by mouth at bedtime. 15 tablet 0   No current facility-administered medications for this visit.   Facility-Administered Medications Ordered in Other Visits  Medication Dose Route Frequency Provider Last Rate Last Admin  . sodium chloride flush (NS) 0.9 % injection 10 mL  10 mL Intracatheter PRN Truitt Merle, MD   10 mL at 02/15/20 1126    PHYSICAL EXAMINATION: ECOG PERFORMANCE STATUS: 2 - Symptomatic, <50% confined to bed  Vitals:   02/15/20 0821  BP: (!) 148/70  Pulse: 87  Resp: 20  Temp: 98.3 F (36.8 C)  SpO2: 99%   Filed Weights   02/15/20 0821  Weight: 287 lb 9.6 oz (130.5 kg)    Due to COVID19 we will limit examination to appearance. Patient had no complaints.  GENERAL:alert, no distress and comfortable SKIN: skin color normal, no rashes or significant lesions EYES: normal, Conjunctiva are pink and non-injected, sclera clear  NEURO: alert & oriented x 3 with fluent speech   LABORATORY DATA:  I have reviewed the data as listed CBC Latest Ref Rng & Units 02/15/2020 02/08/2020 02/01/2020  WBC 4.0 - 10.5 K/uL 5.5 4.4 5.3  Hemoglobin 12.0 - 15.0 g/dL 9.2(L) 9.1(L) 9.2(L)  Hematocrit 36.0 - 46.0 % 29.0(L) 28.4(L) 28.7(L)  Platelets 150 - 400 K/uL 407(H) 386 385     CMP Latest Ref Rng & Units 02/15/2020 02/08/2020 02/01/2020  Glucose 70 - 99 mg/dL 121(H) 148(H) 97  BUN 6 - 20 mg/dL 6 5(L) 6  Creatinine 0.44 - 1.00 mg/dL 0.72 0.74 0.68  Sodium 135 - 145 mmol/L 142 137 140  Potassium 3.5 - 5.1 mmol/L 3.9 3.9 4.1  Chloride 98 - 111 mmol/L 109 107 107  CO2 22 - 32 mmol/L 22 21(L) 25  Calcium 8.9 - 10.3 mg/dL 9.3 9.3 9.3  Total Protein 6.5 - 8.1 g/dL 7.2 7.2 7.2  Total Bilirubin 0.3 - 1.2  mg/dL 0.4 0.3 0.4  Alkaline Phos 38 - 126 U/L 50 50 55  AST 15 - 41 U/L 21 20 20   ALT 0 - 44 U/L 31 29 29  RADIOGRAPHIC STUDIES: I have personally reviewed the radiological images as listed and agreed with the findings in the report. No results found.   ASSESSMENT & PLAN:  Mayola Mcbain is a 49 y.o. 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  -She underwent right lumpectomy and SLNB with Dr. TMarlou Starkson 09/24/19.Her mammaprint showedHigh Risk Luminal Type B with 29% risk of recurrence in 10 years. -To reduce her high risk adjuvant chemoI started her on adjuvantIV chemoAC q2weeks for 4 cycles12/14/20-2/5/21with C4 at 25% followed by weekly Taxol for 12 weeks starting 01/11/20.  -After chemo she will proceed with Adjuvant Radiation and antiestrogen therapy to further reduce her risk of recurrence.   -S/p week 5 of Taxol she developed neuropathy, mainly numbness and pain in toes. This has effected her gait and sleep so far. She will continue cryotherapy during infusion, I will dose reduce Taxol to 568mm2 today. If this continues to worsen may stop her chemo early. she was concerned about not getting full dose chemo, I reassured her that she has had 2/3 of her treatment she still has gotten majority benefit to reduce her risk of recurrence.  -Labs reviewed, stable and overall adequate to proceed with C6 Taxol with dose reduction.  -She has participatedinour observational UPBEAT clinical trail, no AEs. -Will give her a break from chemo next week and return in 2 weeks with C7.    2. Neuropathy G2,  Recent Fall  -S/p C5 Taxol she had a fall because she could not feel her feet and realized this is from neuropathy.  -She has numbness mainly in her feet with minimal tingling in her toes and fingers. Her symptoms and pain are more present at night and can effect her sleep.  -To reduce long term effects I will dose reduce her Taxol starting with C6. If worsens may not complete all 12 cycles. Continue Cryotherapy.  -I will call in Gabapentin 10016mo start once nightly and titrate up to 3 times nightly. I discussed this can cause drowsiness, (02/15/20).  -I also recommend she use OTC B complex and continues to exercise. She is fine to use foot spa a few days after chemo.    3. 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 Menorrhagia  -Shehassevere menorrhagia with long term bleeding. Shepreviouslyrequired IV iron.Her Gyn started Mirena 5 years ago which stopped her periods.  -She had Mirena removed in 08/2019. Her period has not returned yet. -Given her age she may start menopause soon. Will test her hormonal level in the near futureif she has no menstrual perioddoes not return.  -She has h/o of ovarian cyst. She currently has one of right ovary as seen on 10/26/19 CT scan. Will f/u with Gyn. -She has not had period on chemo so far.   5. HTN, Depression, Anxiety, obesity -On Amlodipine, Losartan, Lopressorand Celexa -She notes she lives with [email or web address]. Anemia  -Has been secondary to menorrhagia in the past, now secondary to chemo  -Has been slowly trending down. Hg stable lately.   7. Insomnia  -She had trouble sleeping prior to her cancer diagnosis.  -  She notes she has tried Melatonin that has not helped and Amitriptyline takes too long to work  -She has been using Ativan as needed which can get her 6 hours. She has been getting better sleep with Trazadone, will continue.  -I advised her to not  take Celexa and Trazodone at the same time given small drug interaction. She understands.    PLAN: -I called in Gabapentin today she will take at night  -Labs reviewed and adequate to proceed with week 6 Taxol today with dose reduction to 4m/m2 due to neuropathy, she will do cryotherapy -Will give chemo break next week.  -Lab, flush, chemo Taxol in 2, 3, 4 weeks  -F/u in 2 weeks     No problem-specific Assessment & Plan notes found for this encounter.   No orders of the defined types were placed in this encounter.  All questions were answered. The patient knows to call the clinic with any problems, questions or concerns. No barriers to learning was detected. The total time spent in the appointment was 30 minutes.     YTruitt Merle MD 02/15/2020   I, AJoslyn Devon am acting as scribe for YTruitt Merle MD.   I have reviewed the above documentation for accuracy and completeness, and I agree with the above.

## 2020-02-08 ENCOUNTER — Other Ambulatory Visit: Payer: Self-pay

## 2020-02-08 ENCOUNTER — Inpatient Hospital Stay: Payer: BLUE CROSS/BLUE SHIELD

## 2020-02-08 VITALS — BP 135/77 | HR 90 | Temp 98.7°F | Resp 20

## 2020-02-08 DIAGNOSIS — Z95828 Presence of other vascular implants and grafts: Secondary | ICD-10-CM

## 2020-02-08 DIAGNOSIS — C50411 Malignant neoplasm of upper-outer quadrant of right female breast: Secondary | ICD-10-CM

## 2020-02-08 DIAGNOSIS — Z170 Estrogen receptor positive status [ER+]: Secondary | ICD-10-CM

## 2020-02-08 LAB — CMP (CANCER CENTER ONLY)
ALT: 29 U/L (ref 0–44)
AST: 20 U/L (ref 15–41)
Albumin: 3.5 g/dL (ref 3.5–5.0)
Alkaline Phosphatase: 50 U/L (ref 38–126)
Anion gap: 9 (ref 5–15)
BUN: 5 mg/dL — ABNORMAL LOW (ref 6–20)
CO2: 21 mmol/L — ABNORMAL LOW (ref 22–32)
Calcium: 9.3 mg/dL (ref 8.9–10.3)
Chloride: 107 mmol/L (ref 98–111)
Creatinine: 0.74 mg/dL (ref 0.44–1.00)
GFR, Est AFR Am: >60 mL/min (ref >60)
GFR, Estimated: >60 mL/min (ref >60)
Glucose, Bld: 148 mg/dL — ABNORMAL HIGH (ref 70–99)
Potassium: 3.9 mmol/L (ref 3.5–5.1)
Sodium: 137 mmol/L (ref 135–145)
Total Bilirubin: 0.3 mg/dL (ref 0.3–1.2)
Total Protein: 7.2 g/dL (ref 6.5–8.1)

## 2020-02-08 LAB — CBC WITH DIFFERENTIAL (CANCER CENTER ONLY)
Abs Immature Granulocytes: 0.02 10*3/uL (ref 0.00–0.07)
Basophils Absolute: 0 10*3/uL (ref 0.0–0.1)
Basophils Relative: 1 %
Eosinophils Absolute: 0.2 10*3/uL (ref 0.0–0.5)
Eosinophils Relative: 3 %
HCT: 28.4 % — ABNORMAL LOW (ref 36.0–46.0)
Hemoglobin: 9.1 g/dL — ABNORMAL LOW (ref 12.0–15.0)
Immature Granulocytes: 1 %
Lymphocytes Relative: 22 %
Lymphs Abs: 1 10*3/uL (ref 0.7–4.0)
MCH: 28.4 pg (ref 26.0–34.0)
MCHC: 32 g/dL (ref 30.0–36.0)
MCV: 88.8 fL (ref 80.0–100.0)
Monocytes Absolute: 0.3 10*3/uL (ref 0.1–1.0)
Monocytes Relative: 6 %
Neutro Abs: 3 10*3/uL (ref 1.7–7.7)
Neutrophils Relative %: 67 %
Platelet Count: 386 10*3/uL (ref 150–400)
RBC: 3.2 MIL/uL — ABNORMAL LOW (ref 3.87–5.11)
RDW: 19.8 % — ABNORMAL HIGH (ref 11.5–15.5)
WBC Count: 4.4 10*3/uL (ref 4.0–10.5)
nRBC: 0 % (ref 0.0–0.2)

## 2020-02-08 MED ORDER — DEXAMETHASONE SODIUM PHOSPHATE 10 MG/ML IJ SOLN
10.0000 mg | Freq: Once | INTRAMUSCULAR | Status: AC
  Administered 2020-02-08: 10 mg via INTRAVENOUS

## 2020-02-08 MED ORDER — DEXAMETHASONE SODIUM PHOSPHATE 10 MG/ML IJ SOLN
INTRAMUSCULAR | Status: AC
  Filled 2020-02-08: qty 1

## 2020-02-08 MED ORDER — DIPHENHYDRAMINE HCL 50 MG/ML IJ SOLN
INTRAMUSCULAR | Status: AC
  Filled 2020-02-08: qty 1

## 2020-02-08 MED ORDER — SODIUM CHLORIDE 0.9% FLUSH
10.0000 mL | INTRAVENOUS | Status: DC | PRN
  Administered 2020-02-08: 10 mL
  Filled 2020-02-08: qty 10

## 2020-02-08 MED ORDER — SODIUM CHLORIDE 0.9 % IV SOLN
Freq: Once | INTRAVENOUS | Status: AC
  Filled 2020-02-08: qty 250

## 2020-02-08 MED ORDER — DIPHENHYDRAMINE HCL 50 MG/ML IJ SOLN
25.0000 mg | Freq: Once | INTRAMUSCULAR | Status: AC
  Administered 2020-02-08: 25 mg via INTRAVENOUS

## 2020-02-08 MED ORDER — HEPARIN SOD (PORK) LOCK FLUSH 100 UNIT/ML IV SOLN
500.0000 [IU] | Freq: Once | INTRAVENOUS | Status: AC | PRN
  Administered 2020-02-08: 500 [IU]
  Filled 2020-02-08: qty 5

## 2020-02-08 MED ORDER — FAMOTIDINE IN NACL 20-0.9 MG/50ML-% IV SOLN
INTRAVENOUS | Status: AC
  Filled 2020-02-08: qty 50

## 2020-02-08 MED ORDER — SODIUM CHLORIDE 0.9 % IV SOLN
80.0000 mg/m2 | Freq: Once | INTRAVENOUS | Status: AC
  Administered 2020-02-08: 198 mg via INTRAVENOUS
  Filled 2020-02-08: qty 33

## 2020-02-08 MED ORDER — SODIUM CHLORIDE 0.9% FLUSH
10.0000 mL | INTRAVENOUS | Status: DC | PRN
  Administered 2020-02-08: 10 mL via INTRAVENOUS
  Filled 2020-02-08: qty 10

## 2020-02-08 MED ORDER — FAMOTIDINE IN NACL 20-0.9 MG/50ML-% IV SOLN
20.0000 mg | Freq: Once | INTRAVENOUS | Status: AC
  Administered 2020-02-08: 20 mg via INTRAVENOUS

## 2020-02-08 NOTE — Patient Instructions (Signed)

## 2020-02-08 NOTE — Patient Instructions (Signed)
[facility] Discharge Instructions for Patients Receiving Chemotherapy  Today you received the following chemotherapy agents: paclitaxel.  To help prevent nausea and vomiting after your treatment, we encourage you to take your nausea medication as directed.   If you develop nausea and vomiting that is not controlled by your nausea medication, call the clinic.   BELOW ARE SYMPTOMS THAT SHOULD BE REPORTED IMMEDIATELY:  *FEVER GREATER THAN 100.5 F  *CHILLS WITH OR WITHOUT FEVER  NAUSEA AND VOMITING THAT IS NOT CONTROLLED WITH YOUR NAUSEA MEDICATION  *UNUSUAL SHORTNESS OF BREATH  *UNUSUAL BRUISING OR BLEEDING  TENDERNESS IN MOUTH AND THROAT WITH OR WITHOUT PRESENCE OF ULCERS  *URINARY PROBLEMS  *BOWEL PROBLEMS  UNUSUAL RASH Items with * indicate a potential emergency and should be followed up as soon as possible.  Feel free to call the clinic should you have any questions or concerns. The clinic phone number is [phone].  Please show the CHEMO ALERT CARD at check-in to the Emergency Department and triage nurse.   

## 2020-02-11 NOTE — Progress Notes (Signed)
Pharmacist Chemotherapy Monitoring - Follow Up Assessment    I verify that I have reviewed each item in the below checklist:  . Regimen for the patient is scheduled for the appropriate day and plan matches scheduled date. Marland Kitchen Appropriate non-routine labs are ordered dependent on drug ordered. . If applicable, additional medications reviewed and ordered per protocol based on lifetime cumulative doses and/or treatment regimen.   Plan for follow-up and/or issues identified: No . I-vent associated with next due treatment: No . MD and/or nursing notified: No  Natesha Hassey K 02/11/2020 8:59 AM

## 2020-02-13 ENCOUNTER — Other Ambulatory Visit: Payer: Self-pay | Admitting: Hematology

## 2020-02-15 ENCOUNTER — Inpatient Hospital Stay: Payer: BLUE CROSS/BLUE SHIELD | Attending: Hematology

## 2020-02-15 ENCOUNTER — Inpatient Hospital Stay (HOSPITAL_BASED_OUTPATIENT_CLINIC_OR_DEPARTMENT_OTHER): Payer: BLUE CROSS/BLUE SHIELD | Admitting: Hematology

## 2020-02-15 ENCOUNTER — Other Ambulatory Visit: Payer: Self-pay

## 2020-02-15 ENCOUNTER — Inpatient Hospital Stay: Payer: BLUE CROSS/BLUE SHIELD

## 2020-02-15 ENCOUNTER — Encounter: Payer: Self-pay | Admitting: Hematology

## 2020-02-15 VITALS — BP 148/70 | HR 87 | Temp 98.3°F | Resp 20 | Ht 66.0 in | Wt 287.6 lb

## 2020-02-15 DIAGNOSIS — G4700 Insomnia, unspecified: Secondary | ICD-10-CM | POA: Insufficient documentation

## 2020-02-15 DIAGNOSIS — Z5111 Encounter for antineoplastic chemotherapy: Secondary | ICD-10-CM | POA: Diagnosis not present

## 2020-02-15 DIAGNOSIS — K449 Diaphragmatic hernia without obstruction or gangrene: Secondary | ICD-10-CM | POA: Insufficient documentation

## 2020-02-15 DIAGNOSIS — C50411 Malignant neoplasm of upper-outer quadrant of right female breast: Secondary | ICD-10-CM

## 2020-02-15 DIAGNOSIS — I700 Atherosclerosis of aorta: Secondary | ICD-10-CM | POA: Diagnosis not present

## 2020-02-15 DIAGNOSIS — K5730 Diverticulosis of large intestine without perforation or abscess without bleeding: Secondary | ICD-10-CM | POA: Insufficient documentation

## 2020-02-15 DIAGNOSIS — R5383 Other fatigue: Secondary | ICD-10-CM | POA: Diagnosis not present

## 2020-02-15 DIAGNOSIS — Z95828 Presence of other vascular implants and grafts: Secondary | ICD-10-CM | POA: Insufficient documentation

## 2020-02-15 DIAGNOSIS — G629 Polyneuropathy, unspecified: Secondary | ICD-10-CM | POA: Insufficient documentation

## 2020-02-15 DIAGNOSIS — R269 Unspecified abnormalities of gait and mobility: Secondary | ICD-10-CM | POA: Insufficient documentation

## 2020-02-15 DIAGNOSIS — T451X5A Adverse effect of antineoplastic and immunosuppressive drugs, initial encounter: Secondary | ICD-10-CM | POA: Diagnosis not present

## 2020-02-15 DIAGNOSIS — R911 Solitary pulmonary nodule: Secondary | ICD-10-CM | POA: Insufficient documentation

## 2020-02-15 DIAGNOSIS — Z170 Estrogen receptor positive status [ER+]: Secondary | ICD-10-CM

## 2020-02-15 DIAGNOSIS — E669 Obesity, unspecified: Secondary | ICD-10-CM | POA: Insufficient documentation

## 2020-02-15 DIAGNOSIS — I10 Essential (primary) hypertension: Secondary | ICD-10-CM | POA: Diagnosis not present

## 2020-02-15 DIAGNOSIS — D6481 Anemia due to antineoplastic chemotherapy: Secondary | ICD-10-CM | POA: Diagnosis not present

## 2020-02-15 DIAGNOSIS — F418 Other specified anxiety disorders: Secondary | ICD-10-CM | POA: Diagnosis not present

## 2020-02-15 DIAGNOSIS — N920 Excessive and frequent menstruation with regular cycle: Secondary | ICD-10-CM | POA: Insufficient documentation

## 2020-02-15 DIAGNOSIS — Z79899 Other long term (current) drug therapy: Secondary | ICD-10-CM | POA: Diagnosis not present

## 2020-02-15 LAB — CBC WITH DIFFERENTIAL (CANCER CENTER ONLY)
Abs Immature Granulocytes: 0.04 10*3/uL (ref 0.00–0.07)
Basophils Absolute: 0 10*3/uL (ref 0.0–0.1)
Basophils Relative: 0 %
Eosinophils Absolute: 0.1 10*3/uL (ref 0.0–0.5)
Eosinophils Relative: 2 %
HCT: 29 % — ABNORMAL LOW (ref 36.0–46.0)
Hemoglobin: 9.2 g/dL — ABNORMAL LOW (ref 12.0–15.0)
Immature Granulocytes: 1 %
Lymphocytes Relative: 22 %
Lymphs Abs: 1.2 10*3/uL (ref 0.7–4.0)
MCH: 28 pg (ref 26.0–34.0)
MCHC: 31.7 g/dL (ref 30.0–36.0)
MCV: 88.4 fL (ref 80.0–100.0)
Monocytes Absolute: 0.4 10*3/uL (ref 0.1–1.0)
Monocytes Relative: 8 %
Neutro Abs: 3.7 10*3/uL (ref 1.7–7.7)
Neutrophils Relative %: 67 %
Platelet Count: 407 10*3/uL — ABNORMAL HIGH (ref 150–400)
RBC: 3.28 MIL/uL — ABNORMAL LOW (ref 3.87–5.11)
RDW: 19.9 % — ABNORMAL HIGH (ref 11.5–15.5)
WBC Count: 5.5 10*3/uL (ref 4.0–10.5)
nRBC: 0.7 % — ABNORMAL HIGH (ref 0.0–0.2)

## 2020-02-15 LAB — CMP (CANCER CENTER ONLY)
ALT: 31 U/L (ref 0–44)
AST: 21 U/L (ref 15–41)
Albumin: 3.6 g/dL (ref 3.5–5.0)
Alkaline Phosphatase: 50 U/L (ref 38–126)
Anion gap: 11 (ref 5–15)
BUN: 6 mg/dL (ref 6–20)
CO2: 22 mmol/L (ref 22–32)
Calcium: 9.3 mg/dL (ref 8.9–10.3)
Chloride: 109 mmol/L (ref 98–111)
Creatinine: 0.72 mg/dL (ref 0.44–1.00)
GFR, Est AFR Am: >60 mL/min (ref >60)
GFR, Estimated: >60 mL/min (ref >60)
Glucose, Bld: 121 mg/dL — ABNORMAL HIGH (ref 70–99)
Potassium: 3.9 mmol/L (ref 3.5–5.1)
Sodium: 142 mmol/L (ref 135–145)
Total Bilirubin: 0.4 mg/dL (ref 0.3–1.2)
Total Protein: 7.2 g/dL (ref 6.5–8.1)

## 2020-02-15 MED ORDER — SODIUM CHLORIDE 0.9 % IV SOLN
50.0000 mg/m2 | Freq: Once | INTRAVENOUS | Status: AC
  Administered 2020-02-15: 126 mg via INTRAVENOUS
  Filled 2020-02-15: qty 21

## 2020-02-15 MED ORDER — GABAPENTIN 100 MG PO CAPS: 100.0000 mg | ORAL_CAPSULE | Freq: Every day | ORAL | 1 refills | Status: DC

## 2020-02-15 MED ORDER — SODIUM CHLORIDE 0.9 % IV SOLN
Freq: Once | INTRAVENOUS | Status: AC
  Filled 2020-02-15: qty 250

## 2020-02-15 MED ORDER — SODIUM CHLORIDE 0.9% FLUSH
10.0000 mL | INTRAVENOUS | Status: DC | PRN
  Administered 2020-02-15: 10 mL
  Filled 2020-02-15: qty 10

## 2020-02-15 MED ORDER — SODIUM CHLORIDE 0.9% FLUSH
10.0000 mL | Freq: Once | INTRAVENOUS | Status: AC
  Administered 2020-02-15: 10 mL
  Filled 2020-02-15: qty 10

## 2020-02-15 MED ORDER — DEXAMETHASONE SODIUM PHOSPHATE 10 MG/ML IJ SOLN
10.0000 mg | Freq: Once | INTRAMUSCULAR | Status: AC
  Administered 2020-02-15: 10 mg via INTRAVENOUS

## 2020-02-15 MED ORDER — DIPHENHYDRAMINE HCL 50 MG/ML IJ SOLN
INTRAMUSCULAR | Status: AC
  Filled 2020-02-15: qty 1

## 2020-02-15 MED ORDER — DEXAMETHASONE SODIUM PHOSPHATE 10 MG/ML IJ SOLN
INTRAMUSCULAR | Status: AC
  Filled 2020-02-15: qty 1

## 2020-02-15 MED ORDER — FAMOTIDINE IN NACL 20-0.9 MG/50ML-% IV SOLN
20.0000 mg | Freq: Once | INTRAVENOUS | Status: AC
  Administered 2020-02-15: 20 mg via INTRAVENOUS

## 2020-02-15 MED ORDER — FAMOTIDINE IN NACL 20-0.9 MG/50ML-% IV SOLN
INTRAVENOUS | Status: AC
  Filled 2020-02-15: qty 50

## 2020-02-15 MED ORDER — HEPARIN SOD (PORK) LOCK FLUSH 100 UNIT/ML IV SOLN
500.0000 [IU] | Freq: Once | INTRAVENOUS | Status: AC | PRN
  Administered 2020-02-15: 500 [IU]
  Filled 2020-02-15: qty 5

## 2020-02-15 MED ORDER — DIPHENHYDRAMINE HCL 50 MG/ML IJ SOLN
25.0000 mg | Freq: Once | INTRAMUSCULAR | Status: AC
  Administered 2020-02-15: 25 mg via INTRAVENOUS

## 2020-02-15 NOTE — Patient Instructions (Signed)
[facility] Discharge Instructions for Patients Receiving Chemotherapy  Today you received the following chemotherapy agents: paclitaxel.  To help prevent nausea and vomiting after your treatment, we encourage you to take your nausea medication as directed.   If you develop nausea and vomiting that is not controlled by your nausea medication, call the clinic.   BELOW ARE SYMPTOMS THAT SHOULD BE REPORTED IMMEDIATELY:  *FEVER GREATER THAN 100.5 F  *CHILLS WITH OR WITHOUT FEVER  NAUSEA AND VOMITING THAT IS NOT CONTROLLED WITH YOUR NAUSEA MEDICATION  *UNUSUAL SHORTNESS OF BREATH  *UNUSUAL BRUISING OR BLEEDING  TENDERNESS IN MOUTH AND THROAT WITH OR WITHOUT PRESENCE OF ULCERS  *URINARY PROBLEMS  *BOWEL PROBLEMS  UNUSUAL RASH Items with * indicate a potential emergency and should be followed up as soon as possible.  Feel free to call the clinic should you have any questions or concerns. The clinic phone number is [phone].  Please show the CHEMO ALERT CARD at check-in to the Emergency Department and triage nurse.   

## 2020-02-22 ENCOUNTER — Ambulatory Visit: Payer: BLUE CROSS/BLUE SHIELD

## 2020-02-22 ENCOUNTER — Other Ambulatory Visit: Payer: BLUE CROSS/BLUE SHIELD

## 2020-02-28 ENCOUNTER — Encounter: Payer: Self-pay | Admitting: Hematology

## 2020-02-28 ENCOUNTER — Other Ambulatory Visit: Payer: Self-pay | Admitting: General Surgery

## 2020-02-28 ENCOUNTER — Other Ambulatory Visit: Payer: Self-pay

## 2020-02-28 DIAGNOSIS — Z170 Estrogen receptor positive status [ER+]: Secondary | ICD-10-CM

## 2020-02-28 DIAGNOSIS — C50411 Malignant neoplasm of upper-outer quadrant of right female breast: Secondary | ICD-10-CM

## 2020-02-28 DIAGNOSIS — D249 Benign neoplasm of unspecified breast: Secondary | ICD-10-CM

## 2020-02-28 NOTE — Progress Notes (Signed)
[facility] OFFICE PROGRESS NOTE  Burnett Sheng, MD [place] [address]  DIAGNOSIS: F/u of right breast cancer  Oncology History Overview Note  Cancer Staging Malignant neoplasm of upper-outer quadrant of right breast in female, estrogen receptor positive ([place]) Staging form: Breast, AJCC 8th Edition - Clinical stage from 08/28/2019: Stage IB (cT2, cN0, cM0, G2, ER+, PR+, HER2-) - Signed by Truitt Merle, MD on 09/04/2019 - Pathologic stage from 09/24/2019: Stage IB (pT2, pN1a, cM0, G2, ER+, PR+, HER2-) - Signed by Truitt Merle, MD on 10/10/2019    Malignant neoplasm of upper-outer quadrant of right breast in female, estrogen receptor positive ([place])  08/24/2019 Mammogram   Diagnostic mammogram and [place] 08/24/19 IMPRESSION: Suspicious right breast mass 10 o'clock 1 cm from the nipple measuring 3.3 x 1.9 x 1.9 cm and axillary adenopathy.   08/28/2019 Initial Biopsy   Diagnosis 08/28/19 1. Breast, right, needle core biopsy, 10 o'clock - INVASIVE MAMMARY CARCINOMA, GRADE II. - SEE MICROSCOPIC DESCRIPTION. 2. Lymph node, needle/core biopsy, right axilla - BENIGN LYMPH NODE. - NO METASTATIC CARCINOMA IDENTIFIED.    08/28/2019 Receptors her2   Results: IMMUNOHISTOCHEMICAL AND MORPHOMETRIC ANALYSIS PERFORMED MANUALLY The tumor cells are NEGATIVE for Her2 (1+). Estrogen Receptor: 100%, POSITIVE, STRONG STAINING INTENSITY Progesterone Receptor: 100%, POSITIVE, STRONG STAINING INTENSITY Proliferation Marker Ki67: 10%   08/28/2019 Cancer Staging   Staging form: Breast, AJCC 8th Edition - Clinical stage from 08/28/2019: Stage IB (cT2, cN0, cM0, G2, ER+, PR+, HER2-) - Signed by Truitt Merle, MD on 09/04/2019   08/31/2019 Initial Diagnosis   Malignant neoplasm of upper-outer quadrant of right breast in female, estrogen receptor positive ([place])   09/07/2019 Breast MRI   Breast MRI 09/07/19  IMPRESSION: 1. 2.5 cm known malignancy in the anterior right breast,  superficial depth near the nipple. 2. 7-8 mm indeterminate mass in the central upper outer quadrant of the right breast. 3. One right axillary lymph node with apparent mild cortical thickening, which lies slightly anterior and inferior to the biopsied right axillary lymph node ( benign reactive on pathology). This is most likely also benign given the pathology from the adjacent biopsied lymph node. 4. No evidence of left breast malignancy.   09/17/2019 Imaging   [place] right breast 09/17/19  IMPRESSION: Several benign appearing masses in the upper outer quadrant of the right breast without a definite correlate to the finding seen on prior MRI. The right axillary lymph nodes appear similar to the recently biopsied benign lymph node.   09/20/2019 Pathology Results   Diagnosis 09/20/19  Breast, right, needle core biopsy, upper outer quadrant - FIBROADENOMA - NO MALIGNANCY IDENTIFIED    09/21/2019 Genetic Testing   Negative genetic testing:  No pathogenic variants detected on the [facility] Breast Cancer STAT panel and Common Hereditary Cancers panel.  A variant of uncertain significance was identified in the BRCA1 gene, called c.2048A>G ([id number]).  The report date is 09/21/2019.  The STAT Breast cancer panel offered by [facility] includes sequencing and rearrangement analysis for the following 9 genes:  ATM, BRCA1, BRCA2, CDH1, CHEK2, PALB2, PTEN, STK11 and TP53.   The Common Hereditary Cancers Panel offered by [facility] includes sequencing and/or deletion duplication testing of the following 48 genes: APC, ATM, AXIN2, BARD1, BMPR1A, BRCA1, BRCA2, BRIP1, CDH1, CDK4, CDKN2A (p14ARF), CDKN2A (p16INK4a), CHEK2, CTNNA1, DICER1, EPCAM (Deletion/duplication testing only), GREM1 (promoter region deletion/duplication testing only), KIT, MEN1, MLH1, MSH2, MSH3, MSH6, MUTYH, NBN, NF1, NHTL1, PALB2, PDGFRA, PMS2, POLD1, POLE, PTEN, RAD50, RAD51C, RAD51D,  RNF43, SDHB, SDHC, SDHD, SMAD4, SMARCA4. STK11, TP53, TSC1,  TSC2, and VHL.  The following genes were evaluated for sequence changes only: SDHA and HOXB13 c.251G>A variant only.    09/24/2019 Surgery   RIGHT BREAST LUMPECTOMY WITH SENTINEL LYMPH NODE BIOPSY by Dr Marlou Starks  09/24/19    09/24/2019 Pathology Results   FINAL MICROSCOPIC DIAGNOSIS: 09/24/19   A. LYMPH NODE, RIGHT #1, SENTINEL, BIOPSY:  - There is no evidence of carcinoma in 1 of 1 lymph node (0/1).   B. LYMPH NODE, RIGHT, SENTINEL, BIOPSY:  - There is no evidence of carcinoma in 1 of 1 lymph node (0/1).   C. 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   D. LYMPH NODE, RIGHT #2, SENTINEL, BIOPSY:  - There is no evidence of carcinoma in 1 of 1 lymph node (0/1).   E. LYMPH NODE, RIGHT #3 , SENTINEL, BIOPSY:  - There is no evidence of carcinoma in 1 of 1 lymph node (0/1).   F. LYMPH NODE, RIGHT, SENTINEL, BIOPSY:  - There is no evidence of carcinoma in 1 of 1 lymph node (0/1).   G. LYMPH NODE, RIGHT, SENTINEL, BIOPSY:  -There is no evidence of carcinoma in 1 of 1 lymph node (0/1).   H. 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  - Ductal carcinoma in situ, intermediate grade.  - Perineural invasion is identified.  - The surgical resection margins are negative for carcinoma.  - See oncology table below   I. BREAST, RIGHT ADDITIONAL INFERIOR MARGIN, EXCISION:  - Fibrocystic changes.  - There is no evidence of malignancy.     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 Postoperative findings in the right breast and axilla. Upper normal sized right axillary lymph nodes, nonspecific. 2. 2 mm superior apical segment right upper lobe nodule is technically nonspecific although statistically likely to be benign. 3. 3.6 cm hypodense right adnexal structure, probably a cyst but technically nonspecific. If patient is premenopausal, no follow up is warranted; if early postmenopausal, follow up ultrasound in 6-12 months would be recommended. 4. Other imaging findings of potential clinical significance: Small type 1 hiatal hernia. Descending and sigmoid colon diverticulosis. Small indirect left inguinal hernia contains adipose tissue.   Aortic Atherosclerosis (ICD10-I70.0).   10/26/2019 Imaging   Whole Body Bone scan 10/26/19  IMPRESSION: Nonspecific sites of uptake at the RIGHT ischium and anterior LEFT sixth rib, cannot exclude metastatic foci.   Otherwise negative exam.     10/29/2019 -  Chemotherapy   Adjuvant AC q2weeks for 4 cycles starting 10/29/19-12/21/19 followed by weekly Taxol for 12 weeks starting 01/11/20.      CURRENT THERAPY: Adjuvant AC q2weeks for 4 cycles starting 10/29/19-2/5/21followed by weekly Taxol for 12 weeksstarting 01/11/20.Taxol reduced from  80 mg/m2 to 50 mg/m2 starting with C6.   INTERVAL HISTORY: Paula Davidson 49 y.o. female returns to the clinic for a follow up visit. The patient is feeling fairly well today without any concerning complaints except she started developing peripheral neuropathy following cycle #5 of weekly taxol. Her dose was subsequently reduced from 80 mg/m2 to 50 mg/m2 starting from cycle #6. She was also started on gabapentin and the plan is to titrate up. She is now taking 200 mg p.o. nightly, which also helps her sleep. She  also was given a 1 week delay before starting cycle #7, which is scheduled for today.   She notes that following her last infusion her neuropathy was unchanged from prior. She noticed a mild improvement about 2 days ago.  She feels persistent numbness/tingling/pins/needles in her last three toes bilaterally. When she first developed neuropathy after cycle #5, she stood up and could not feel her toes which subsequently caused her to fall. She has not fallen since that time. She denies any changes to her neuropathy after cycle #6 with reduced dose taxol.  She notes very minimal neuropathy in her finger tips, but none at this time.  Otherwise, she denies any fever, chills, night sweats, or weight loss. She has some fatigue 3 days following treatment. She denies chest pain, shortness of breath, or cough. She denies nausea, vomiting, diarrhea, or constipation. She is here today for evaluation before starting her 7th weekly dose of taxol.    MEDICAL HISTORY: Past Medical History:  Diagnosis Date  . Anxiety   . Cancer ([place]) 09/24/2019   right breast cancer-had lumpectomy  . Depression   . DUB (dysfunctional uterine bleeding)   . Family history of pancreatic cancer   . Hypertension     ALLERGIES:  is allergic to hydrochlorothiazide; codeine; and lisinopril.  MEDICATIONS:  Current Outpatient Medications  Medication Sig Dispense Refill  . amLODipine (NORVASC) 10 MG tablet Take 10 mg by mouth daily.    Marland Kitchen Bioflavonoid Products (ESTER C PO) Take 1 tablet by mouth daily.    . cetirizine (ZYRTEC) 10 MG tablet Take 10 mg by mouth daily as needed for allergies.    . cholecalciferol (VITAMIN D) 25 MCG (1000 UT) tablet Take 1,000 Units by mouth daily.    . citalopram (CELEXA) 20 MG tablet Take 20 mg by mouth daily.    Marland Kitchen gabapentin (NEURONTIN) 100 MG capsule Take 1 capsule (100 mg total) by mouth at bedtime. Increase to 2 capsule in a week, and 3 capsule in two weeks, if she tolerates well 90 capsule 1  .  lidocaine-prilocaine (EMLA) cream Apply to affected area once 30 g 3  . LORazepam (ATIVAN) 0.5 MG tablet Take 1-2 tablets (0.5-1 mg total) by mouth every 8 (eight) hours as needed for anxiety (nausea). 20 tablet 0  . losartan (COZAAR) 100 MG tablet Take 100 mg by mouth daily.    . Multiple Vitamin (MULTIVITAMIN WITH MINERALS) TABS tablet Take 1 tablet by mouth daily.    . traZODone (DESYREL) 50 MG tablet Take 1 tablet (50 mg total) by mouth at bedtime. 15 tablet 0  . Calcium Carbonate-Vitamin D (CALCIUM + D PO) Take 1 tablet by mouth daily.     Marland Kitchen HYDROcodone-acetaminophen (NORCO/VICODIN) 5-325 MG tablet Take 1-2 tablets by mouth every 6 (six) hours as needed for moderate pain or severe pain. (Patient not taking: Reported on 02/29/2020) 10 tablet 0  . ondansetron (ZOFRAN) 8 MG tablet Take 1 tablet (8 mg total)  by mouth 2 (two) times daily as needed. Start on the third day after chemotherapy. (Patient not taking: Reported on 02/29/2020) 30 tablet 1  . prochlorperazine (COMPAZINE) 10 MG tablet Take 1 tablet (10 mg total) by mouth every 6 (six) hours as needed (Nausea or vomiting). (Patient not taking: Reported on 02/29/2020) 30 tablet 1  . promethazine (PHENERGAN) 25 MG tablet Take 1 tablet (25 mg total) by mouth every 6 (six) hours as needed for nausea or vomiting. (Patient not taking: Reported on 02/29/2020) 30 tablet 0   No current facility-administered medications for this visit.   Facility-Administered Medications Ordered in Other Visits  Medication Dose Route Frequency Provider Last Rate Last Admin  . dexamethasone (DECADRON) 10 mg in sodium chloride 0.9 % 50 mL IVPB  10 mg Intravenous Once Truitt Merle, MD 204 mL/hr at 02/29/20 1001 10 mg at 02/29/20 1001  . heparin lock flush 100 unit/mL  500 Units Intracatheter Once PRN Truitt Merle, MD      . PACLitaxel (TAXOL) 126 mg in sodium chloride 0.9 % 250 mL chemo infusion (</= 71m/m2)  50 mg/m2 (Order-Specific) Intravenous Once FTruitt Merle MD      . sodium  chloride flush (NS) 0.9 % injection 10 mL  10 mL Intracatheter PRN FTruitt Merle MD   10 mL at 02/29/20 0936    SURGICAL HISTORY:  Past Surgical History:  Procedure Laterality Date  . BREAST LUMPECTOMY WITH AXILLARY LYMPH NODE BIOPSY Right 09/24/2019   Procedure: RIGHT BREAST LUMPECTOMY WITH SENTINEL LYMPH NODE BIOPSY;  Surgeon: TJovita Kussmaul MD;  [facility]  Service: General;  Laterality: Right;  . BREAST SURGERY  09/24/2019   Breast lump excised  . FOOT SURGERY    . LAPAROSCOPIC GASTRIC BANDING    . PORTACATH PLACEMENT N/A 10/25/2019   Procedure: INSERTION PORT-A-CATH WITH POSSIBLE ULTRASOUND;  Surgeon: TJovita Kussmaul MD;  Location: [facility];  Service: General;  Laterality: N/A;  . WISDOM TOOTH EXTRACTION      REVIEW OF SYSTEMS:   Review of Systems  Constitutional: Positive for fatigue on day 3 following treatment. Negative for appetite change, chills, fever and unexpected weight change.  HENT: Negative for mouth sores, nosebleeds, sore throat and trouble swallowing.   Eyes: Negative for eye problems and icterus.  Respiratory: Negative for cough, hemoptysis, shortness of breath and wheezing.  Cardiovascular: Negative for chest pain and leg swelling.  Gastrointestinal: Negative for abdominal pain, constipation, diarrhea, nausea and vomiting.  Genitourinary: Negative for bladder incontinence, difficulty urinating, dysuria, frequency and hematuria.   Musculoskeletal: Negative for back pain, gait problem, neck pain and neck stiffness.  Skin: Negative for itching and rash.  Neurological: Positive for peripheral neuropathy in her last 3 toes bilaterally. Minimal neuropathy in her finger tips. Negative for dizziness, extremity weakness, gait problem, headaches, light-headedness and seizures.  Hematological: Negative for adenopathy. Does not bruise/bleed easily.  Psychiatric/Behavioral: Negative for confusion, depression and sleep disturbance. The patient is not  nervous/anxious.     PHYSICAL EXAMINATION:  Blood pressure (!) 143/82, pulse 92, temperature 98.2 F (36.8 C), temperature source Temporal, resp. rate 18, height 5' 6"  (1.676 m), weight 288 lb 9.6 oz (130.9 kg), SpO2 100 %.  ECOG PERFORMANCE STATUS: 1 - Symptomatic but completely ambulatory  Physical Exam  Constitutional: Oriented to person, place, and time and well-developed, well-nourished, and in no distress.  HENT:  Head: Normocephalic and atraumatic.  Mouth/Throat: Oropharynx is clear and moist. No oropharyngeal exudate.  Eyes: Conjunctivae are normal. Right  eye exhibits no discharge. Left eye exhibits no discharge. No scleral icterus.  Neck: Normal range of motion. Neck supple.  Cardiovascular: Normal rate, regular rhythm, normal heart sounds and intact distal pulses.   Pulmonary/Chest: Effort normal and breath sounds normal. No respiratory distress. No wheezes. No rales.  Abdominal: Soft. Bowel sounds are normal. Exhibits no distension and no mass. There is no tenderness.  Musculoskeletal: Normal range of motion. Exhibits no edema.  Lymphadenopathy:    No cervical adenopathy.  Neurological: Alert and oriented to person, place, and time. Exhibits normal muscle tone. Gait normal. Coordination normal.  Skin: Skin is warm and dry. No rash noted. Not diaphoretic. No erythema. No pallor.  Psychiatric: Mood, memory and judgment normal.  Vitals reviewed.  LABORATORY DATA: Lab Results  Component Value Date   WBC 6.5 02/29/2020   HGB 10.0 (L) 02/29/2020   HCT 31.6 (L) 02/29/2020   MCV 91.3 02/29/2020   PLT 369 02/29/2020      Chemistry      Component Value Date/Time   NA 139 02/29/2020 0807   K 4.0 02/29/2020 0807   CL 107 02/29/2020 0807   CO2 22 02/29/2020 0807   BUN 8 02/29/2020 0807   CREATININE 0.76 02/29/2020 0807      Component Value Date/Time   CALCIUM 9.2 02/29/2020 0807   ALKPHOS 47 02/29/2020 0807   AST 24 02/29/2020 0807   ALT 35 02/29/2020 0807   BILITOT  0.5 02/29/2020 0807       RADIOGRAPHIC STUDIES:  No results found.   ASSESSMENT/PLAN:  Xzandria Clevinger is a 49 y.o. 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  -She underwent right lumpectomy and SLNB with Dr. TMarlou Starkson 09/24/19.Her mammaprint showedHigh Risk Luminal Type B with 29% risk of recurrence in 10 years. -To reduce her high risk adjuvant chemoDr. FBurr Medicostarted her on adjuvantIV chemoAC q2weeks for 4 cycles12/14/20-2/5/21with C4 at 25% followed by weekly Taxol for 12 weeks starting 01/11/20. -After chemo she will proceed with Adjuvant Radiation and antiestrogen therapy to further reduce her risk of recurrence.  -S/p week 5 of Taxol she developed neuropathy, mainly numbness and pain in toes. This has effected her gait and sleep so far. She will continue cryotherapy during infusion,. Her Taxol was reduced from 80 mg/m2 to 5103mm2 today. She was also given a 1 week delay to start cycle #7. If this continues to worsen may stop her chemo early. she was concerned about not getting full dose chemo, Dr. FeBurr Medicoeassured her that she has had 2/3 of her treatment she still has gotten majority benefit to reduce her risk of recurrence.  -Today (4/16) is cycle #7, she is reporting persistent but stable neuropathy. Discussed with Dr. FeBurr Medicoho recommended continuing on reduced dose taxol at 50 mg/m2. Dr. FeBurr Medicotated that if her neuropathy worsens, we may stop her treatment early. I discussed this with the patient and she is aware to call or send a my chart message if she develops any changes/worsening symptoms  in the interval.  -Labs reviewed, stable and overall adequate to proceed with C7 Taxol  -She has participatedinour observational UPBEAT clinical trail, no AEs.  2. Neuropathy G2, Recent Fall  -S/p C5 Taxol she had a fall because she could not feel her toes and realized this is from neuropathy.  -She has numbness mainly in her feet with minimal tingling in her last 3 toes. Mild/miminla neuropathy in her finger tips. Her symptoms and pain are more present at night and can effect her sleep.  -To reduce long term effects Dr. Burr Medico reduced her dose of Taxol starting with C6. If worsens, she may not complete all 12 cycles. Continue Cryotherapy.  -Dr. Burr Medico started her on Gabapentin 160m p.o. nightly and plans to titrate up to 3 times nightly. Discussed this can cause drowsiness, (02/15/20). She is currently taking 200 mg p.o. nightly (02/29/20) -Dr. FBurr Medicopreviously also recommend she use OTC B complex and continues to exercise. She is fine to use foot spa a few days after chemo.    3. 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 Menorrhagia  -Shehassevere menorrhagia with long term bleeding. Shepreviouslyrequired IV iron.Her Gyn started Mirena 5 years ago which stopped her periods.  -She had Mirena removed in 08/2019. Her period has not returned yet. -Given her age she may start menopause soon. Will test her hormonal level in the near futureif she has no menstrual perioddoes not return.  -She has h/o of ovarian cyst. She currently has one of right ovary as seen on 10/26/19 CT scan. Will f/u with Gyn. -She has not had period on chemo so far.  5. HTN, Depression, Anxiety, obesity -On Amlodipine, Losartan, Lopressorand Celexa -[place]  6. Anemia  -Has been secondary to menorrhagia in the past, now secondary to chemo  -Has been slowly trending down. Hgstable lately.   7.  Insomnia  -She had trouble sleeping prior to her cancer diagnosis.  -She notes she has tried Melatonin that has not helped and Amitriptyline takes too long to work  -She has been using Ativan as needed which can get her 6 hours. She has been getting better sleep with Trazadone, will continue.  -Dr. FBurr Medicoadvised her to not take Celexa and Trazodone at the same time given small drug interaction. She understands.   PLAN: -Gabapentin dose currently 200 mg p.o. nightly. She is working on titrating up to 300 mg p.o. nightly.  -Labs reviewed and adequate to proceed with week7Taxol today with dose reduction at 50 mg/m2 due to neuropathy, she will do cryotherapy -Lab, flush, chemo Taxol in 1, 2, 3weeks  -F/u in 2 weeks but reinforced with the patient to call u[place]or send my chart message if she develops any worsening neuropathy in the the interval. She understands that her treatment with taxol may be discontinued early with worsening neuropathy.   No orders of the defined types were placed in this encounter.    CMcBain PA-C 02/29/20

## 2020-02-29 ENCOUNTER — Inpatient Hospital Stay: Payer: BLUE CROSS/BLUE SHIELD

## 2020-02-29 ENCOUNTER — Other Ambulatory Visit: Payer: Self-pay

## 2020-02-29 ENCOUNTER — Encounter: Payer: Self-pay | Admitting: *Deleted

## 2020-02-29 ENCOUNTER — Inpatient Hospital Stay (HOSPITAL_BASED_OUTPATIENT_CLINIC_OR_DEPARTMENT_OTHER): Payer: BLUE CROSS/BLUE SHIELD | Admitting: Physician Assistant

## 2020-02-29 VITALS — BP 143/82 | HR 92 | Temp 98.2°F | Resp 18 | Ht 66.0 in | Wt 288.6 lb

## 2020-02-29 DIAGNOSIS — Z170 Estrogen receptor positive status [ER+]: Secondary | ICD-10-CM

## 2020-02-29 DIAGNOSIS — C50411 Malignant neoplasm of upper-outer quadrant of right female breast: Secondary | ICD-10-CM

## 2020-02-29 DIAGNOSIS — F419 Anxiety disorder, unspecified: Secondary | ICD-10-CM | POA: Insufficient documentation

## 2020-02-29 DIAGNOSIS — Z5111 Encounter for antineoplastic chemotherapy: Secondary | ICD-10-CM | POA: Diagnosis not present

## 2020-02-29 DIAGNOSIS — Z95828 Presence of other vascular implants and grafts: Secondary | ICD-10-CM

## 2020-02-29 LAB — CBC WITH DIFFERENTIAL (CANCER CENTER ONLY)
Abs Immature Granulocytes: 0.02 10*3/uL (ref 0.00–0.07)
Basophils Absolute: 0 10*3/uL (ref 0.0–0.1)
Basophils Relative: 1 %
Eosinophils Absolute: 0.1 10*3/uL (ref 0.0–0.5)
Eosinophils Relative: 2 %
HCT: 31.6 % — ABNORMAL LOW (ref 36.0–46.0)
Hemoglobin: 10 g/dL — ABNORMAL LOW (ref 12.0–15.0)
Immature Granulocytes: 0 %
Lymphocytes Relative: 21 %
Lymphs Abs: 1.3 10*3/uL (ref 0.7–4.0)
MCH: 28.9 pg (ref 26.0–34.0)
MCHC: 31.6 g/dL (ref 30.0–36.0)
MCV: 91.3 fL (ref 80.0–100.0)
Monocytes Absolute: 0.5 10*3/uL (ref 0.1–1.0)
Monocytes Relative: 8 %
Neutro Abs: 4.4 10*3/uL (ref 1.7–7.7)
Neutrophils Relative %: 68 %
Platelet Count: 369 10*3/uL (ref 150–400)
RBC: 3.46 MIL/uL — ABNORMAL LOW (ref 3.87–5.11)
RDW: 17.7 % — ABNORMAL HIGH (ref 11.5–15.5)
WBC Count: 6.5 10*3/uL (ref 4.0–10.5)
nRBC: 0 % (ref 0.0–0.2)

## 2020-02-29 LAB — CMP (CANCER CENTER ONLY)
ALT: 35 U/L (ref 0–44)
AST: 24 U/L (ref 15–41)
Albumin: 3.6 g/dL (ref 3.5–5.0)
Alkaline Phosphatase: 47 U/L (ref 38–126)
Anion gap: 10 (ref 5–15)
BUN: 8 mg/dL (ref 6–20)
CO2: 22 mmol/L (ref 22–32)
Calcium: 9.2 mg/dL (ref 8.9–10.3)
Chloride: 107 mmol/L (ref 98–111)
Creatinine: 0.76 mg/dL (ref 0.44–1.00)
GFR, Est AFR Am: >60 mL/min (ref >60)
GFR, Estimated: >60 mL/min (ref >60)
Glucose, Bld: 150 mg/dL — ABNORMAL HIGH (ref 70–99)
Potassium: 4 mmol/L (ref 3.5–5.1)
Sodium: 139 mmol/L (ref 135–145)
Total Bilirubin: 0.5 mg/dL (ref 0.3–1.2)
Total Protein: 7.3 g/dL (ref 6.5–8.1)

## 2020-02-29 MED ORDER — FAMOTIDINE IN NACL 20-0.9 MG/50ML-% IV SOLN
INTRAVENOUS | Status: AC
  Filled 2020-02-29: qty 50

## 2020-02-29 MED ORDER — SODIUM CHLORIDE 0.9 % IV SOLN
Freq: Once | INTRAVENOUS | Status: AC
  Filled 2020-02-29: qty 250

## 2020-02-29 MED ORDER — DIPHENHYDRAMINE HCL 50 MG/ML IJ SOLN
INTRAMUSCULAR | Status: AC
  Filled 2020-02-29: qty 1

## 2020-02-29 MED ORDER — HEPARIN SOD (PORK) LOCK FLUSH 100 UNIT/ML IV SOLN
500.0000 [IU] | Freq: Once | INTRAVENOUS | Status: AC | PRN
  Administered 2020-02-29: 500 [IU]
  Filled 2020-02-29: qty 5

## 2020-02-29 MED ORDER — FAMOTIDINE IN NACL 20-0.9 MG/50ML-% IV SOLN
20.0000 mg | Freq: Once | INTRAVENOUS | Status: AC
  Administered 2020-02-29: 20 mg via INTRAVENOUS

## 2020-02-29 MED ORDER — SODIUM CHLORIDE 0.9% FLUSH
10.0000 mL | Freq: Once | INTRAVENOUS | Status: AC
  Administered 2020-02-29: 10 mL
  Filled 2020-02-29: qty 10

## 2020-02-29 MED ORDER — SODIUM CHLORIDE 0.9 % IV SOLN
50.0000 mg/m2 | Freq: Once | INTRAVENOUS | Status: AC
  Administered 2020-02-29: 11:00:00 126 mg via INTRAVENOUS
  Filled 2020-02-29: qty 21

## 2020-02-29 MED ORDER — SODIUM CHLORIDE 0.9% FLUSH
10.0000 mL | INTRAVENOUS | Status: DC | PRN
  Administered 2020-02-29 (×2): 10 mL
  Filled 2020-02-29: qty 10

## 2020-02-29 MED ORDER — SODIUM CHLORIDE 0.9 % IV SOLN
10.0000 mg | Freq: Once | INTRAVENOUS | Status: AC
  Administered 2020-02-29: 10 mg via INTRAVENOUS
  Filled 2020-02-29: qty 10

## 2020-02-29 MED ORDER — DIPHENHYDRAMINE HCL 50 MG/ML IJ SOLN
25.0000 mg | Freq: Once | INTRAMUSCULAR | Status: AC
  Administered 2020-02-29: 25 mg via INTRAVENOUS

## 2020-02-29 NOTE — Patient Instructions (Signed)
[facility] Discharge Instructions for Patients Receiving Chemotherapy  Today you received the following chemotherapy agents: paclitaxel.  To help prevent nausea and vomiting after your treatment, we encourage you to take your nausea medication as directed.   If you develop nausea and vomiting that is not controlled by your nausea medication, call the clinic.   BELOW ARE SYMPTOMS THAT SHOULD BE REPORTED IMMEDIATELY:  *FEVER GREATER THAN 100.5 F  *CHILLS WITH OR WITHOUT FEVER  NAUSEA AND VOMITING THAT IS NOT CONTROLLED WITH YOUR NAUSEA MEDICATION  *UNUSUAL SHORTNESS OF BREATH  *UNUSUAL BRUISING OR BLEEDING  TENDERNESS IN MOUTH AND THROAT WITH OR WITHOUT PRESENCE OF ULCERS  *URINARY PROBLEMS  *BOWEL PROBLEMS  UNUSUAL RASH Items with * indicate a potential emergency and should be followed up as soon as possible.  Feel free to call the clinic should you have any questions or concerns. The clinic phone number is [phone].  Please show the CHEMO ALERT CARD at check-in to the Emergency Department and triage nurse.   

## 2020-03-03 NOTE — Progress Notes (Signed)
Pharmacist Chemotherapy Monitoring - Follow Up Assessment    I verify that I have reviewed each item in the below checklist:  . Regimen for the patient is scheduled for the appropriate day and plan matches scheduled date. Marland Kitchen Appropriate non-routine labs are ordered dependent on drug ordered. . If applicable, additional medications reviewed and ordered per protocol based on lifetime cumulative doses and/or treatment regimen.   Plan for follow-up and/or issues identified: No . I-vent associated with next due treatment: No . MD and/or nursing notified: No  Delane Ginger 03/03/2020 3:15 PM

## 2020-03-07 ENCOUNTER — Inpatient Hospital Stay: Payer: BLUE CROSS/BLUE SHIELD

## 2020-03-07 ENCOUNTER — Other Ambulatory Visit: Payer: Self-pay

## 2020-03-07 VITALS — BP 128/73 | HR 75 | Temp 98.1°F | Resp 20

## 2020-03-07 DIAGNOSIS — C50411 Malignant neoplasm of upper-outer quadrant of right female breast: Secondary | ICD-10-CM

## 2020-03-07 DIAGNOSIS — Z170 Estrogen receptor positive status [ER+]: Secondary | ICD-10-CM

## 2020-03-07 DIAGNOSIS — Z95828 Presence of other vascular implants and grafts: Secondary | ICD-10-CM

## 2020-03-07 LAB — CMP (CANCER CENTER ONLY)
ALT: 29 U/L (ref 0–44)
AST: 21 U/L (ref 15–41)
Albumin: 3.6 g/dL (ref 3.5–5.0)
Alkaline Phosphatase: 49 U/L (ref 38–126)
Anion gap: 12 (ref 5–15)
BUN: 8 mg/dL (ref 6–20)
CO2: 24 mmol/L (ref 22–32)
Calcium: 9.6 mg/dL (ref 8.9–10.3)
Chloride: 107 mmol/L (ref 98–111)
Creatinine: 0.72 mg/dL (ref 0.44–1.00)
GFR, Est AFR Am: >60 mL/min (ref >60)
GFR, Estimated: >60 mL/min (ref >60)
Glucose, Bld: 99 mg/dL (ref 70–99)
Potassium: 4.2 mmol/L (ref 3.5–5.1)
Sodium: 143 mmol/L (ref 135–145)
Total Bilirubin: 0.4 mg/dL (ref 0.3–1.2)
Total Protein: 7.3 g/dL (ref 6.5–8.1)

## 2020-03-07 LAB — CBC WITH DIFFERENTIAL (CANCER CENTER ONLY)
Abs Immature Granulocytes: 0.02 10*3/uL (ref 0.00–0.07)
Basophils Absolute: 0 10*3/uL (ref 0.0–0.1)
Basophils Relative: 0 %
Eosinophils Absolute: 0.3 10*3/uL (ref 0.0–0.5)
Eosinophils Relative: 5 %
HCT: 31.4 % — ABNORMAL LOW (ref 36.0–46.0)
Hemoglobin: 9.9 g/dL — ABNORMAL LOW (ref 12.0–15.0)
Immature Granulocytes: 0 %
Lymphocytes Relative: 20 %
Lymphs Abs: 1.2 10*3/uL (ref 0.7–4.0)
MCH: 28.8 pg (ref 26.0–34.0)
MCHC: 31.5 g/dL (ref 30.0–36.0)
MCV: 91.3 fL (ref 80.0–100.0)
Monocytes Absolute: 0.5 10*3/uL (ref 0.1–1.0)
Monocytes Relative: 8 %
Neutro Abs: 4.2 10*3/uL (ref 1.7–7.7)
Neutrophils Relative %: 67 %
Platelet Count: 407 10*3/uL — ABNORMAL HIGH (ref 150–400)
RBC: 3.44 MIL/uL — ABNORMAL LOW (ref 3.87–5.11)
RDW: 16.4 % — ABNORMAL HIGH (ref 11.5–15.5)
WBC Count: 6.3 10*3/uL (ref 4.0–10.5)
nRBC: 0 % (ref 0.0–0.2)

## 2020-03-07 MED ORDER — SODIUM CHLORIDE 0.9 % IV SOLN
10.0000 mg | Freq: Once | INTRAVENOUS | Status: AC
  Administered 2020-03-07: 10 mg via INTRAVENOUS
  Filled 2020-03-07: qty 10

## 2020-03-07 MED ORDER — SODIUM CHLORIDE 0.9 % IV SOLN
50.0000 mg/m2 | Freq: Once | INTRAVENOUS | Status: AC
  Administered 2020-03-07: 126 mg via INTRAVENOUS
  Filled 2020-03-07: qty 21

## 2020-03-07 MED ORDER — FAMOTIDINE IN NACL 20-0.9 MG/50ML-% IV SOLN
INTRAVENOUS | Status: AC
  Filled 2020-03-07: qty 50

## 2020-03-07 MED ORDER — HEPARIN SOD (PORK) LOCK FLUSH 100 UNIT/ML IV SOLN
500.0000 [IU] | Freq: Once | INTRAVENOUS | Status: AC | PRN
  Administered 2020-03-07: 500 [IU]
  Filled 2020-03-07: qty 5

## 2020-03-07 MED ORDER — SODIUM CHLORIDE 0.9% FLUSH
10.0000 mL | Freq: Once | INTRAVENOUS | Status: AC
  Administered 2020-03-07: 10 mL
  Filled 2020-03-07: qty 10

## 2020-03-07 MED ORDER — DIPHENHYDRAMINE HCL 50 MG/ML IJ SOLN
INTRAMUSCULAR | Status: AC
  Filled 2020-03-07: qty 1

## 2020-03-07 MED ORDER — FAMOTIDINE IN NACL 20-0.9 MG/50ML-% IV SOLN
20.0000 mg | Freq: Once | INTRAVENOUS | Status: AC
  Administered 2020-03-07: 20 mg via INTRAVENOUS

## 2020-03-07 MED ORDER — DIPHENHYDRAMINE HCL 50 MG/ML IJ SOLN
25.0000 mg | Freq: Once | INTRAMUSCULAR | Status: AC
  Administered 2020-03-07: 25 mg via INTRAVENOUS

## 2020-03-07 MED ORDER — SODIUM CHLORIDE 0.9% FLUSH
10.0000 mL | INTRAVENOUS | Status: DC | PRN
  Administered 2020-03-07: 10 mL
  Filled 2020-03-07: qty 10

## 2020-03-07 MED ORDER — SODIUM CHLORIDE 0.9 % IV SOLN
Freq: Once | INTRAVENOUS | Status: AC
  Filled 2020-03-07: qty 250

## 2020-03-09 IMAGING — DX DG CHEST 1V PORT
1 series · 1 of 1 positions shown · non-contrast
Comparison: No recent prior.

CLINICAL DATA: Port-A-Cath.

EXAM:
PORTABLE CHEST 1 VIEW

[chest ap]
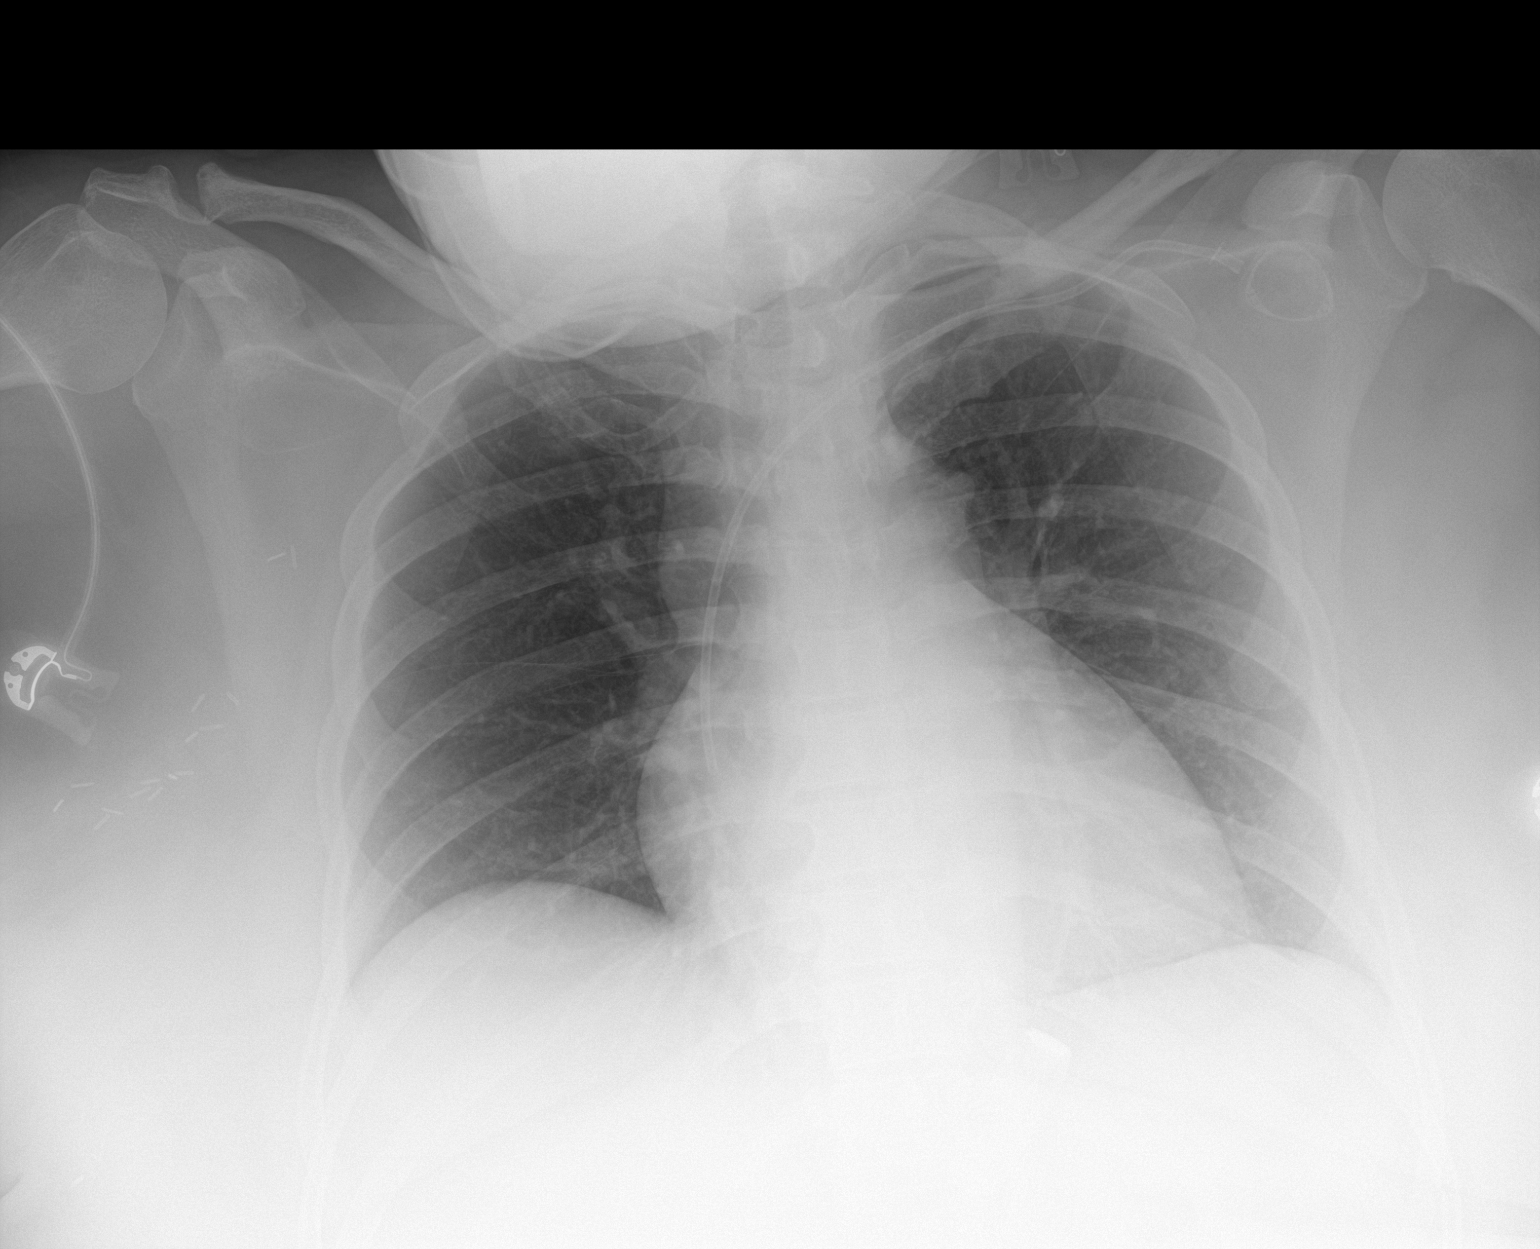

[1 of 1 positions shown; findings below may reference images not displayed]

FINDINGS: Port-A-Cath noted with tip over cavoatrial junction. Heart size
normal. Lungs are clear. No pleural effusion or pneumothorax.
Degenerative change thoracic spine. Surgical clips right axilla.
IMPRESSION: Port-A-Cath noted with tip over cavoatrial junction. No acute
cardiopulmonary disease identified.

## 2020-03-10 NOTE — Progress Notes (Signed)
Pharmacist Chemotherapy Monitoring - Follow Up Assessment    I verify that I have reviewed each item in the below checklist:  . Regimen for the patient is scheduled for the appropriate day and plan matches scheduled date. Marland Kitchen Appropriate non-routine labs are ordered dependent on drug ordered. . If applicable, additional medications reviewed and ordered per protocol based on lifetime cumulative doses and/or treatment regimen.   Plan for follow-up and/or issues identified: No . I-vent associated with next due treatment: Yes . MD and/or nursing notified: No  Adelina Mings 03/10/2020 11:19 AM

## 2020-03-12 NOTE — Progress Notes (Signed)
Paula Davidson   [phone] Fax:([phone]   Clinic Follow up Note   Patient Care Team: Burnett Sheng, MD as PCP - General (Family Medicine) Mauro Kaufmann, RN as Oncology Nurse Navigator Rockwell Germany, RN as Oncology Nurse Navigator Jovita Kussmaul, MD as Consulting Physician (General Surgery) Truitt Merle, MD as Consulting Physician (Hematology) Eppie Gibson, MD as Attending Physician (Radiation Oncology)  Date of Service:  03/14/2020  CHIEF COMPLAINT: F/u of right breast cancer  SUMMARY OF ONCOLOGIC HISTORY: Oncology History Overview Note  Cancer Staging Malignant neoplasm of upper-outer quadrant of right breast in female, estrogen receptor positive ([place]) Staging form: Breast, AJCC 8th Edition - Clinical stage from 08/28/2019: Stage IB (cT2, cN0, cM0, G2, ER+, PR+, HER2-) - Signed by Truitt Merle, MD on 09/04/2019 - Pathologic stage from 09/24/2019: Stage IB (pT2, pN1a, cM0, G2, ER+, PR+, HER2-) - Signed by Truitt Merle, MD on 10/10/2019    Malignant neoplasm of upper-outer quadrant of right breast in female, estrogen receptor positive ([place])  08/24/2019 Mammogram   Diagnostic mammogram and [place] 08/24/19 IMPRESSION: Suspicious right breast mass 10 o'clock 1 cm from the nipple measuring 3.3 x 1.9 x 1.9 cm and axillary adenopathy.   08/28/2019 Initial Biopsy   Diagnosis 08/28/19 1. Breast, right, needle core biopsy, 10 o'clock - INVASIVE MAMMARY CARCINOMA, GRADE II. - SEE MICROSCOPIC DESCRIPTION. 2. Lymph node, needle/core biopsy, right axilla - BENIGN LYMPH NODE. - NO METASTATIC CARCINOMA IDENTIFIED.    08/28/2019 Receptors her2   Results: IMMUNOHISTOCHEMICAL AND MORPHOMETRIC ANALYSIS PERFORMED MANUALLY The tumor cells are NEGATIVE for Her2 (1+). Estrogen Receptor: 100%, POSITIVE, STRONG STAINING INTENSITY Progesterone Receptor: 100%, POSITIVE, STRONG STAINING INTENSITY Proliferation Marker Ki67: 10%   08/28/2019 Cancer Staging   Staging  form: Breast, AJCC 8th Edition - Clinical stage from 08/28/2019: Stage IB (cT2, cN0, cM0, G2, ER+, PR+, HER2-) - Signed by Truitt Merle, MD on 09/04/2019   08/31/2019 Initial Diagnosis   Malignant neoplasm of upper-outer quadrant of right breast in female, estrogen receptor positive ([place])   09/07/2019 Breast MRI   Breast MRI 09/07/19  IMPRESSION: 1. 2.5 cm known malignancy in the anterior right breast, superficial depth near the nipple. 2. 7-8 mm indeterminate mass in the central upper outer quadrant of the right breast. 3. One right axillary lymph node with apparent mild cortical thickening, which lies slightly anterior and inferior to the biopsied right axillary lymph node ( benign reactive on pathology). This is most likely also benign given the pathology from the adjacent biopsied lymph node. 4. No evidence of left breast malignancy.   09/17/2019 Imaging   [place] right breast 09/17/19  IMPRESSION: Several benign appearing masses in the upper outer quadrant of the right breast without a definite correlate to the finding seen on prior MRI. The right axillary lymph nodes appear similar to the recently biopsied benign lymph node.   09/20/2019 Pathology Results   Diagnosis 09/20/19  Breast, right, needle core biopsy, upper outer quadrant - FIBROADENOMA - NO MALIGNANCY IDENTIFIED    09/21/2019 Genetic Testing   Negative genetic testing:  No pathogenic variants detected on the [facility] Breast Cancer STAT panel and Common Hereditary Cancers panel.  A variant of uncertain significance was identified in the BRCA1 gene, called c.2048A>G ([id number]).  The report date is 09/21/2019.  The STAT Breast cancer panel offered by [facility] includes sequencing and rearrangement analysis for the following 9 genes:  ATM, BRCA1, BRCA2, CDH1, CHEK2, PALB2, PTEN, STK11 and TP53.   The Common  Hereditary Cancers Panel offered by [facility] includes sequencing and/or deletion duplication testing of the following 48  genes: APC, ATM, AXIN2, BARD1, BMPR1A, BRCA1, BRCA2, BRIP1, CDH1, CDK4, CDKN2A (p14ARF), CDKN2A (p16INK4a), CHEK2, CTNNA1, DICER1, EPCAM (Deletion/duplication testing only), GREM1 (promoter region deletion/duplication testing only), KIT, MEN1, MLH1, MSH2, MSH3, MSH6, MUTYH, NBN, NF1, NHTL1, PALB2, PDGFRA, PMS2, POLD1, POLE, PTEN, RAD50, RAD51C, RAD51D, RNF43, SDHB, SDHC, SDHD, SMAD4, SMARCA4. STK11, TP53, TSC1, TSC2, and VHL.  The following genes were evaluated for sequence changes only: SDHA and HOXB13 c.251G>A variant only.    09/24/2019 Surgery   RIGHT BREAST LUMPECTOMY WITH SENTINEL LYMPH NODE BIOPSY by Dr Marlou Starks  09/24/19    09/24/2019 Pathology Results   FINAL MICROSCOPIC DIAGNOSIS: 09/24/19   A. LYMPH NODE, RIGHT #1, SENTINEL, BIOPSY:  - There is no evidence of carcinoma in 1 of 1 lymph node (0/1).   B. LYMPH NODE, RIGHT, SENTINEL, BIOPSY:  - There is no evidence of carcinoma in 1 of 1 lymph node (0/1).   C. 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   D. LYMPH NODE, RIGHT #2, SENTINEL, BIOPSY:  - There is no evidence of carcinoma in 1 of 1 lymph node (0/1).   E. LYMPH NODE, RIGHT #3 , SENTINEL, BIOPSY:  - There is no evidence of carcinoma in 1 of 1 lymph node (0/1).   F. LYMPH NODE, RIGHT, SENTINEL, BIOPSY:  - There is no evidence of carcinoma in 1 of 1 lymph node (0/1).   G. LYMPH NODE, RIGHT, SENTINEL, BIOPSY:  -There is no evidence of carcinoma in 1 of 1 lymph node (0/1).   H. 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  - Ductal carcinoma in situ, intermediate grade.  - Perineural invasion is identified.  - The surgical resection margins are negative for carcinoma.  - See oncology table below   I. BREAST, RIGHT ADDITIONAL INFERIOR MARGIN, EXCISION:  - Fibrocystic changes.  - There is no evidence of malignancy.     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 Postoperative findings in the right breast and axilla. Upper normal sized right axillary lymph nodes, nonspecific. 2. 2 mm superior apical segment right upper lobe nodule is technically nonspecific although statistically likely to be benign. 3. 3.6 cm hypodense right adnexal structure, probably a cyst but technically nonspecific. If patient is premenopausal, no follow up is warranted; if early postmenopausal, follow up ultrasound in 6-12 months would be recommended. 4. Other imaging findings of potential clinical significance: Small type 1 hiatal hernia. Descending and sigmoid colon diverticulosis. Small indirect left inguinal hernia contains adipose tissue.   Aortic Atherosclerosis (ICD10-I70.0).   10/26/2019 Imaging   Whole Body Bone scan 10/26/19  IMPRESSION: Nonspecific sites of uptake at the RIGHT ischium and anterior  LEFT sixth rib, cannot exclude metastatic foci.   Otherwise negative exam.     10/29/2019 -  Chemotherapy   Adjuvant AC q2weeks for 4 cycles starting 10/29/19-12/21/19 followed by weekly Taxol for 12 weeks starting 01/11/20.       CURRENT THERAPY:  Adjuvant AC q2weeks for 4 cycles starting 10/29/19-2/5/21followed by weekly Taxol for 12 weeksstarting 01/11/20.Taxol reduced 30% starting with C6  INTERVAL HISTORY:  Paula Davidson is here for a follow up and treatment. She  presents to the clinic alone.  She is tolerating chemotherapy well overall, her neuropathy has overall improved, especially after the chemo break 2 weeks ago.  It is mainly in her feet, numbness, no significant tingling, more prominent right after chemo and improves over time.  She has restless leg syndrome at night, she has been taking gabapentin 200 mg and trazodone.  She is otherwise doing well, no significant nausea, diarrhea, or other complaints.  Weight is stable.  Review of system otherwise negative  MEDICAL HISTORY:  Past Medical History:  Diagnosis Date  . Anxiety   . Cancer ([place]) 09/24/2019   right breast cancer-had lumpectomy  . Depression   . DUB (dysfunctional uterine bleeding)   . Family history of pancreatic cancer   . Hypertension     SURGICAL HISTORY: Past Surgical History:  Procedure Laterality Date  . BREAST LUMPECTOMY WITH AXILLARY LYMPH NODE BIOPSY Right 09/24/2019   Procedure: RIGHT BREAST LUMPECTOMY WITH SENTINEL LYMPH NODE BIOPSY;  Surgeon: Jovita Kussmaul, MD;  Location: [facility];  Service: General;  Laterality: Right;  . BREAST SURGERY  09/24/2019   Breast lump excised  . FOOT SURGERY    . LAPAROSCOPIC GASTRIC BANDING    . PORTACATH PLACEMENT N/A 10/25/2019   Procedure: INSERTION PORT-A-CATH WITH POSSIBLE ULTRASOUND;  Surgeon: Jovita Kussmaul, MD;  Location: [facility];  Service: General;  Laterality: N/A;  . WISDOM TOOTH EXTRACTION      I have reviewed the social history and family history with the patient and they are unchanged from previous note.  ALLERGIES:  is allergic to hydrochlorothiazide; codeine; and lisinopril.  MEDICATIONS:  Current Outpatient Medications  Medication Sig Dispense Refill  . amLODipine (NORVASC) 10 MG tablet Take 10 mg by mouth daily.    Marland Kitchen Bioflavonoid Products (ESTER C PO) Take 1 tablet by mouth daily.    . Calcium Carbonate-Vitamin D (CALCIUM + D PO) Take 1 tablet by mouth daily.     . cetirizine (ZYRTEC) 10 MG  tablet Take 10 mg by mouth daily as needed for allergies.    . cholecalciferol (VITAMIN D) 25 MCG (1000 UT) tablet Take 1,000 Units by mouth daily.    . citalopram (CELEXA) 20 MG tablet Take 20 mg by mouth daily.    Marland Kitchen gabapentin (NEURONTIN) 100 MG capsule Take 1 capsule (100 mg total) by mouth at bedtime. Increase to 2 capsule in a week, and 3 capsule in two weeks, if she tolerates well 90 capsule 1  . lidocaine-prilocaine (EMLA) cream Apply to affected area once 30 g 3  . LORazepam (ATIVAN) 0.5 MG tablet Take 1-2 tablets (0.5-1 mg total) by mouth every 8 (eight) hours as needed for anxiety (nausea). 20 tablet 0  . losartan (COZAAR) 100 MG tablet Take 100 mg by mouth daily.    . Multiple Vitamin (MULTIVITAMIN WITH MINERALS) TABS tablet Take 1 tablet by mouth daily.    . traZODone (DESYREL) 50 MG tablet Take 1 tablet (50 mg total) by mouth at bedtime  as needed for sleep. 30 tablet 1  . HYDROcodone-acetaminophen (NORCO/VICODIN) 5-325 MG tablet Take 1-2 tablets by mouth every 6 (six) hours as needed for moderate pain or severe pain. (Patient not taking: Reported on 02/29/2020) 10 tablet 0  . ondansetron (ZOFRAN) 8 MG tablet Take 1 tablet (8 mg total) by mouth 2 (two) times daily as needed. Start on the third day after chemotherapy. (Patient not taking: Reported on 02/29/2020) 30 tablet 1  . prochlorperazine (COMPAZINE) 10 MG tablet Take 1 tablet (10 mg total) by mouth every 6 (six) hours as needed (Nausea or vomiting). (Patient not taking: Reported on 02/29/2020) 30 tablet 1  . promethazine (PHENERGAN) 25 MG tablet Take 1 tablet (25 mg total) by mouth every 6 (six) hours as needed for nausea or vomiting. (Patient not taking: Reported on 02/29/2020) 30 tablet 0   No current facility-administered medications for this visit.    PHYSICAL EXAMINATION: ECOG PERFORMANCE STATUS: 1 - Symptomatic but completely ambulatory  Vitals:   03/14/20 0840  BP: 134/75  Pulse: 78  Resp: 17  Temp: 98.9 F (37.2 C)   SpO2: 100%   Filed Weights   03/14/20 0840  Weight: 290 lb (131.5 kg)    GENERAL:alert, no distress and comfortable SKIN: skin color, texture, turgor are normal, no rashes or significant lesions EYES: normal, Conjunctiva are pink and non-injected, sclera clear NECK: supple, thyroid normal size, non-tender, without nodularity LYMPH:  no palpable lymphadenopathy in the cervical, axillary  LUNGS: clear to auscultation and percussion with normal breathing effort HEART: regular rate & rhythm and no murmurs and no lower extremity edema ABDOMEN:abdomen soft, non-tender and normal bowel sounds Musculoskeletal:no cyanosis of digits and no clubbing  NEURO: alert & oriented x 3 with fluent speech, no focal motor/sensory deficits, vibration sensation intact or up in the lower extremities.  LABORATORY DATA:  I have reviewed the data as listed CBC Latest Ref Rng & Units 03/14/2020 03/07/2020 02/29/2020  WBC 4.0 - 10.5 K/uL 5.5 6.3 6.5  Hemoglobin 12.0 - 15.0 g/dL 10.2(L) 9.9(L) 10.0(L)  Hematocrit 36.0 - 46.0 % 31.8(L) 31.4(L) 31.6(L)  Platelets 150 - 400 K/uL 401(H) 407(H) 369     CMP Latest Ref Rng & Units 03/14/2020 03/07/2020 02/29/2020  Glucose 70 - 99 mg/dL 101(H) 99 150(H)  BUN 6 - 20 mg/dL 10 8 8   Creatinine 0.44 - 1.00 mg/dL 0.70 0.72 0.76  Sodium 135 - 145 mmol/L 140 143 139  Potassium 3.5 - 5.1 mmol/L 3.9 4.2 4.0  Chloride 98 - 111 mmol/L 106 107 107  CO2 22 - 32 mmol/L 25 24 22   Calcium 8.9 - 10.3 mg/dL 9.7 9.6 9.2  Total Protein 6.5 - 8.1 g/dL 7.3 7.3 7.3  Total Bilirubin 0.3 - 1.2 mg/dL 0.3 0.4 0.5  Alkaline Phos 38 - 126 U/L 49 49 47  AST 15 - 41 U/L 19 21 24   ALT 0 - 44 U/L 26 29 35      RADIOGRAPHIC STUDIES: I have personally reviewed the radiological images as listed and agreed with the findings in the report. No results found.   ASSESSMENT & PLAN:  Treena Cosman is a 49 y.o. female with    1Malignant neoplasm of upper-outer quadrant of right  breast,pT2N1aM0,stage IB,ER+/PR+, HER2-, GradeII -She was diagnosed in 08/2019. Shehas had a right breast mass for 7-8 yearsand recently grew.Her [place] and Biopsy show grade II invasive mammary carcinoma of right breast and lymph node negative.MRI showed tumor 2.5cm, 1 positive axillary LN and a 7-59m  indeterminate mass that biopsies and found to be Fibroadenoma.  -She underwent right lumpectomy and SLNB with Dr. Marlou Starks on 09/24/19.Her mammaprint showedHigh Risk Luminal Type B with 29% risk of recurrence in 10 years. -To reduce her high risk adjuvant chemoI started her on adjuvantIV chemoAC q2weeks for 4 cycles12/14/20-2/5/21with C4 at 25% followed by weekly Taxol dose reduced for 12 weeks starting 01/11/20.Taxol dose reduced again C7 to 60m/m2.  -After chemo she will proceed with Adjuvant Radiation and antiestrogen therapy to further reduce her risk of recurrence.  -Her neuropathy has been stable, slightly improved, after dose reduction and chemo break.  Her vibration sensation was intact on exam today.  We will continue Taxol 50 mg/m weekly today, and 3 more weeks.  If she does have significant worsening neuropathy, I may stop early.  2. Neuropathy G1 -S/p C5 Taxol she had a fall because she could not feel her feet and realized this is from neuropathy.  -She has numbness mainly in her feet with minimal tingling in her toes and fingers. Her symptoms and pain are more present at night and can effect her sleep.  -To reduce long term effects I will dose reduce her Taxol starting with C6. If worsens may not complete all 12 cycles. Continue Cryotherapy. Taxol dose reduced again C7 to 52mm2.  -She is on Gabapentin 20056mightly.  I recommend her to increase to 300 mg at night, mainly for her restless leg syndrome -Neuropathy overall has slightly improved  3. Genetic Testingnegative for pathogenetic mutations -She does haveVUS of BRCA1 c.2048A>G  4. HTN, Depression, Anxiety, obesity  -On Amlodipine, Losartan, Lopressorand Celexa -She [place]. Anemia  -Has been secondary to menorrhagia in the past, now secondary to chemo  -Has been slowly trending down. Hgstable lately.   6. Insomnia  -She had trouble sleeping prior to her cancer diagnosis.  -She notes she has tried Melatonin that has not helped and Amitriptyline takes too long to work  -She has been using Ativan as needed which can get her 6 hours. She has been getting better sleep with Trazadone, will continue.  -I previously advised her to not take Celexa and Trazodone at the same time given small drug interaction. She understands.   PLAN: -She will increase gabapentin to 300 mg at night -Lab reviewed, adequate for treatment, will proceed Taxol at reduced dose 50 mg/m today, and 3 more weeks -Follow-up in 2 weeks  No problem-specific Assessment & Plan notes found for this encounter.   No orders of the defined types were placed in this encounter.  All questions were answered. The patient knows to call the clinic with any problems, questions or concerns. No barriers to learning was detected. The total time spent in the appointment was 30 minutes.     YanTruitt MerleD 03/14/2020   I, AmoJoslyn Devonm acting as scribe for YanTruitt MerleD.   I have reviewed the above documentation for accuracy and completeness, and I agree with the above.

## 2020-03-14 ENCOUNTER — Inpatient Hospital Stay: Payer: BLUE CROSS/BLUE SHIELD

## 2020-03-14 ENCOUNTER — Encounter: Payer: Self-pay | Admitting: Hematology

## 2020-03-14 ENCOUNTER — Telehealth: Payer: Self-pay | Admitting: Hematology

## 2020-03-14 ENCOUNTER — Inpatient Hospital Stay (HOSPITAL_BASED_OUTPATIENT_CLINIC_OR_DEPARTMENT_OTHER): Payer: BLUE CROSS/BLUE SHIELD | Admitting: Hematology

## 2020-03-14 ENCOUNTER — Other Ambulatory Visit: Payer: Self-pay

## 2020-03-14 VITALS — BP 134/75 | HR 78 | Temp 98.9°F | Resp 17 | Ht 66.0 in | Wt 290.0 lb

## 2020-03-14 DIAGNOSIS — Z95828 Presence of other vascular implants and grafts: Secondary | ICD-10-CM

## 2020-03-14 DIAGNOSIS — I10 Essential (primary) hypertension: Secondary | ICD-10-CM

## 2020-03-14 DIAGNOSIS — C50411 Malignant neoplasm of upper-outer quadrant of right female breast: Secondary | ICD-10-CM

## 2020-03-14 DIAGNOSIS — Z170 Estrogen receptor positive status [ER+]: Secondary | ICD-10-CM

## 2020-03-14 LAB — CMP (CANCER CENTER ONLY)
ALT: 26 U/L (ref 0–44)
AST: 19 U/L (ref 15–41)
Albumin: 3.6 g/dL (ref 3.5–5.0)
Alkaline Phosphatase: 49 U/L (ref 38–126)
Anion gap: 9 (ref 5–15)
BUN: 10 mg/dL (ref 6–20)
CO2: 25 mmol/L (ref 22–32)
Calcium: 9.7 mg/dL (ref 8.9–10.3)
Chloride: 106 mmol/L (ref 98–111)
Creatinine: 0.7 mg/dL (ref 0.44–1.00)
GFR, Est AFR Am: >60 mL/min (ref >60)
GFR, Estimated: >60 mL/min (ref >60)
Glucose, Bld: 101 mg/dL — ABNORMAL HIGH (ref 70–99)
Potassium: 3.9 mmol/L (ref 3.5–5.1)
Sodium: 140 mmol/L (ref 135–145)
Total Bilirubin: 0.3 mg/dL (ref 0.3–1.2)
Total Protein: 7.3 g/dL (ref 6.5–8.1)

## 2020-03-14 LAB — CBC WITH DIFFERENTIAL (CANCER CENTER ONLY)
Abs Immature Granulocytes: 0.03 10*3/uL (ref 0.00–0.07)
Basophils Absolute: 0 10*3/uL (ref 0.0–0.1)
Basophils Relative: 1 %
Eosinophils Absolute: 0.2 10*3/uL (ref 0.0–0.5)
Eosinophils Relative: 4 %
HCT: 31.8 % — ABNORMAL LOW (ref 36.0–46.0)
Hemoglobin: 10.2 g/dL — ABNORMAL LOW (ref 12.0–15.0)
Immature Granulocytes: 1 %
Lymphocytes Relative: 25 %
Lymphs Abs: 1.4 10*3/uL (ref 0.7–4.0)
MCH: 28.9 pg (ref 26.0–34.0)
MCHC: 32.1 g/dL (ref 30.0–36.0)
MCV: 90.1 fL (ref 80.0–100.0)
Monocytes Absolute: 0.4 10*3/uL (ref 0.1–1.0)
Monocytes Relative: 8 %
Neutro Abs: 3.4 10*3/uL (ref 1.7–7.7)
Neutrophils Relative %: 61 %
Platelet Count: 401 10*3/uL — ABNORMAL HIGH (ref 150–400)
RBC: 3.53 MIL/uL — ABNORMAL LOW (ref 3.87–5.11)
RDW: 15.7 % — ABNORMAL HIGH (ref 11.5–15.5)
WBC Count: 5.5 10*3/uL (ref 4.0–10.5)
nRBC: 0 % (ref 0.0–0.2)

## 2020-03-14 MED ORDER — TRAZODONE HCL 50 MG PO TABS: 50.0000 mg | ORAL_TABLET | Freq: Every evening | ORAL | 1 refills | Status: DC | PRN

## 2020-03-14 MED ORDER — SODIUM CHLORIDE 0.9% FLUSH
10.0000 mL | Freq: Once | INTRAVENOUS | Status: AC
  Administered 2020-03-14: 10 mL
  Filled 2020-03-14: qty 10

## 2020-03-14 MED ORDER — SODIUM CHLORIDE 0.9 % IV SOLN
Freq: Once | INTRAVENOUS | Status: AC
  Filled 2020-03-14: qty 250

## 2020-03-14 MED ORDER — HEPARIN SOD (PORK) LOCK FLUSH 100 UNIT/ML IV SOLN
500.0000 [IU] | Freq: Once | INTRAVENOUS | Status: AC | PRN
  Administered 2020-03-14: 500 [IU]
  Filled 2020-03-14: qty 5

## 2020-03-14 MED ORDER — SODIUM CHLORIDE 0.9% FLUSH
10.0000 mL | INTRAVENOUS | Status: DC | PRN
  Administered 2020-03-14: 10 mL
  Filled 2020-03-14: qty 10

## 2020-03-14 MED ORDER — SODIUM CHLORIDE 0.9 % IV SOLN
10.0000 mg | Freq: Once | INTRAVENOUS | Status: AC
  Administered 2020-03-14: 10 mg via INTRAVENOUS
  Filled 2020-03-14: qty 10

## 2020-03-14 MED ORDER — FAMOTIDINE IN NACL 20-0.9 MG/50ML-% IV SOLN
INTRAVENOUS | Status: AC
  Filled 2020-03-14: qty 50

## 2020-03-14 MED ORDER — DIPHENHYDRAMINE HCL 50 MG/ML IJ SOLN
25.0000 mg | Freq: Once | INTRAMUSCULAR | Status: AC
  Administered 2020-03-14: 25 mg via INTRAVENOUS

## 2020-03-14 MED ORDER — DIPHENHYDRAMINE HCL 50 MG/ML IJ SOLN
INTRAMUSCULAR | Status: AC
  Filled 2020-03-14: qty 1

## 2020-03-14 MED ORDER — SODIUM CHLORIDE 0.9 % IV SOLN
50.0000 mg/m2 | Freq: Once | INTRAVENOUS | Status: AC
  Administered 2020-03-14: 126 mg via INTRAVENOUS
  Filled 2020-03-14: qty 21

## 2020-03-14 MED ORDER — FAMOTIDINE IN NACL 20-0.9 MG/50ML-% IV SOLN
20.0000 mg | Freq: Once | INTRAVENOUS | Status: AC
  Administered 2020-03-14: 20 mg via INTRAVENOUS

## 2020-03-14 NOTE — Patient Instructions (Signed)

## 2020-03-14 NOTE — Patient Instructions (Signed)
[facility] Discharge Instructions for Patients Receiving Chemotherapy  Today you received the following chemotherapy agents: paclitaxel.  To help prevent nausea and vomiting after your treatment, we encourage you to take your nausea medication as directed.   If you develop nausea and vomiting that is not controlled by your nausea medication, call the clinic.   BELOW ARE SYMPTOMS THAT SHOULD BE REPORTED IMMEDIATELY:  *FEVER GREATER THAN 100.5 F  *CHILLS WITH OR WITHOUT FEVER  NAUSEA AND VOMITING THAT IS NOT CONTROLLED WITH YOUR NAUSEA MEDICATION  *UNUSUAL SHORTNESS OF BREATH  *UNUSUAL BRUISING OR BLEEDING  TENDERNESS IN MOUTH AND THROAT WITH OR WITHOUT PRESENCE OF ULCERS  *URINARY PROBLEMS  *BOWEL PROBLEMS  UNUSUAL RASH Items with * indicate a potential emergency and should be followed up as soon as possible.  Feel free to call the clinic should you have any questions or concerns. The clinic phone number is [phone].  Please show the CHEMO ALERT CARD at check-in to the Emergency Department and triage nurse.   

## 2020-03-14 NOTE — Telephone Encounter (Signed)
Scheduled appt per 4/30 los. ° ° °

## 2020-03-17 NOTE — Progress Notes (Signed)
Pharmacist Chemotherapy Monitoring - Follow Up Assessment    I verify that I have reviewed each item in the below checklist:  . Regimen for the patient is scheduled for the appropriate day and plan matches scheduled date. Marland Kitchen Appropriate non-routine labs are ordered dependent on drug ordered. . If applicable, additional medications reviewed and ordered per protocol based on lifetime cumulative doses and/or treatment regimen.   Plan for follow-up and/or issues identified: No . I-vent associated with next due treatment: No . MD and/or nursing notified: No  Adis Sturgill K 03/17/2020 11:21 AM

## 2020-03-18 ENCOUNTER — Encounter: Payer: Self-pay | Admitting: *Deleted

## 2020-03-21 ENCOUNTER — Inpatient Hospital Stay: Payer: BLUE CROSS/BLUE SHIELD | Attending: Hematology

## 2020-03-21 ENCOUNTER — Other Ambulatory Visit: Payer: Self-pay

## 2020-03-21 ENCOUNTER — Inpatient Hospital Stay: Payer: BLUE CROSS/BLUE SHIELD

## 2020-03-21 VITALS — BP 131/63 | HR 73 | Temp 98.0°F | Resp 18

## 2020-03-21 DIAGNOSIS — Z79899 Other long term (current) drug therapy: Secondary | ICD-10-CM | POA: Diagnosis not present

## 2020-03-21 DIAGNOSIS — K4090 Unilateral inguinal hernia, without obstruction or gangrene, not specified as recurrent: Secondary | ICD-10-CM | POA: Diagnosis not present

## 2020-03-21 DIAGNOSIS — N920 Excessive and frequent menstruation with regular cycle: Secondary | ICD-10-CM | POA: Diagnosis not present

## 2020-03-21 DIAGNOSIS — I10 Essential (primary) hypertension: Secondary | ICD-10-CM | POA: Insufficient documentation

## 2020-03-21 DIAGNOSIS — D6481 Anemia due to antineoplastic chemotherapy: Secondary | ICD-10-CM | POA: Diagnosis present

## 2020-03-21 DIAGNOSIS — T451X5A Adverse effect of antineoplastic and immunosuppressive drugs, initial encounter: Secondary | ICD-10-CM | POA: Insufficient documentation

## 2020-03-21 DIAGNOSIS — E669 Obesity, unspecified: Secondary | ICD-10-CM | POA: Diagnosis not present

## 2020-03-21 DIAGNOSIS — Z170 Estrogen receptor positive status [ER+]: Secondary | ICD-10-CM

## 2020-03-21 DIAGNOSIS — C50411 Malignant neoplasm of upper-outer quadrant of right female breast: Secondary | ICD-10-CM | POA: Diagnosis not present

## 2020-03-21 DIAGNOSIS — G629 Polyneuropathy, unspecified: Secondary | ICD-10-CM | POA: Diagnosis not present

## 2020-03-21 DIAGNOSIS — F418 Other specified anxiety disorders: Secondary | ICD-10-CM | POA: Insufficient documentation

## 2020-03-21 DIAGNOSIS — R911 Solitary pulmonary nodule: Secondary | ICD-10-CM | POA: Diagnosis not present

## 2020-03-21 DIAGNOSIS — Z5111 Encounter for antineoplastic chemotherapy: Secondary | ICD-10-CM | POA: Diagnosis not present

## 2020-03-21 DIAGNOSIS — I700 Atherosclerosis of aorta: Secondary | ICD-10-CM | POA: Diagnosis not present

## 2020-03-21 DIAGNOSIS — K449 Diaphragmatic hernia without obstruction or gangrene: Secondary | ICD-10-CM | POA: Insufficient documentation

## 2020-03-21 DIAGNOSIS — Z95828 Presence of other vascular implants and grafts: Secondary | ICD-10-CM

## 2020-03-21 DIAGNOSIS — K5730 Diverticulosis of large intestine without perforation or abscess without bleeding: Secondary | ICD-10-CM | POA: Diagnosis not present

## 2020-03-21 DIAGNOSIS — G4700 Insomnia, unspecified: Secondary | ICD-10-CM | POA: Diagnosis not present

## 2020-03-21 LAB — CBC WITH DIFFERENTIAL (CANCER CENTER ONLY)
Abs Immature Granulocytes: 0.02 10*3/uL (ref 0.00–0.07)
Basophils Absolute: 0 10*3/uL (ref 0.0–0.1)
Basophils Relative: 1 %
Eosinophils Absolute: 0.1 10*3/uL (ref 0.0–0.5)
Eosinophils Relative: 2 %
HCT: 31.8 % — ABNORMAL LOW (ref 36.0–46.0)
Hemoglobin: 10.1 g/dL — ABNORMAL LOW (ref 12.0–15.0)
Immature Granulocytes: 0 %
Lymphocytes Relative: 24 %
Lymphs Abs: 1.2 10*3/uL (ref 0.7–4.0)
MCH: 28.8 pg (ref 26.0–34.0)
MCHC: 31.8 g/dL (ref 30.0–36.0)
MCV: 90.6 fL (ref 80.0–100.0)
Monocytes Absolute: 0.4 10*3/uL (ref 0.1–1.0)
Monocytes Relative: 8 %
Neutro Abs: 3.3 10*3/uL (ref 1.7–7.7)
Neutrophils Relative %: 65 %
Platelet Count: 400 10*3/uL (ref 150–400)
RBC: 3.51 MIL/uL — ABNORMAL LOW (ref 3.87–5.11)
RDW: 15.9 % — ABNORMAL HIGH (ref 11.5–15.5)
WBC Count: 5.2 10*3/uL (ref 4.0–10.5)
nRBC: 0 % (ref 0.0–0.2)

## 2020-03-21 LAB — CMP (CANCER CENTER ONLY)
ALT: 27 U/L (ref 0–44)
AST: 18 U/L (ref 15–41)
Albumin: 3.6 g/dL (ref 3.5–5.0)
Alkaline Phosphatase: 51 U/L (ref 38–126)
Anion gap: 11 (ref 5–15)
BUN: 8 mg/dL (ref 6–20)
CO2: 24 mmol/L (ref 22–32)
Calcium: 9.7 mg/dL (ref 8.9–10.3)
Chloride: 107 mmol/L (ref 98–111)
Creatinine: 0.68 mg/dL (ref 0.44–1.00)
GFR, Est AFR Am: >60 mL/min (ref >60)
GFR, Estimated: >60 mL/min (ref >60)
Glucose, Bld: 98 mg/dL (ref 70–99)
Potassium: 4.1 mmol/L (ref 3.5–5.1)
Sodium: 142 mmol/L (ref 135–145)
Total Bilirubin: 0.2 mg/dL — ABNORMAL LOW (ref 0.3–1.2)
Total Protein: 7.1 g/dL (ref 6.5–8.1)

## 2020-03-21 MED ORDER — FAMOTIDINE IN NACL 20-0.9 MG/50ML-% IV SOLN
INTRAVENOUS | Status: AC
  Filled 2020-03-21: qty 50

## 2020-03-21 MED ORDER — SODIUM CHLORIDE 0.9% FLUSH
10.0000 mL | INTRAVENOUS | Status: DC | PRN
  Administered 2020-03-21: 10 mL
  Filled 2020-03-21: qty 10

## 2020-03-21 MED ORDER — SODIUM CHLORIDE 0.9 % IV SOLN
50.0000 mg/m2 | Freq: Once | INTRAVENOUS | Status: AC
  Administered 2020-03-21: 126 mg via INTRAVENOUS
  Filled 2020-03-21: qty 21

## 2020-03-21 MED ORDER — FAMOTIDINE IN NACL 20-0.9 MG/50ML-% IV SOLN
20.0000 mg | Freq: Once | INTRAVENOUS | Status: AC
  Administered 2020-03-21: 20 mg via INTRAVENOUS

## 2020-03-21 MED ORDER — SODIUM CHLORIDE 0.9% FLUSH
10.0000 mL | Freq: Once | INTRAVENOUS | Status: AC
  Administered 2020-03-21: 10 mL
  Filled 2020-03-21: qty 10

## 2020-03-21 MED ORDER — SODIUM CHLORIDE 0.9 % IV SOLN
Freq: Once | INTRAVENOUS | Status: AC
  Filled 2020-03-21: qty 250

## 2020-03-21 MED ORDER — DIPHENHYDRAMINE HCL 50 MG/ML IJ SOLN
INTRAMUSCULAR | Status: AC
  Filled 2020-03-21: qty 1

## 2020-03-21 MED ORDER — DIPHENHYDRAMINE HCL 50 MG/ML IJ SOLN
25.0000 mg | Freq: Once | INTRAMUSCULAR | Status: AC
  Administered 2020-03-21: 25 mg via INTRAVENOUS

## 2020-03-21 MED ORDER — SODIUM CHLORIDE 0.9 % IV SOLN
10.0000 mg | Freq: Once | INTRAVENOUS | Status: AC
  Administered 2020-03-21: 10 mg via INTRAVENOUS
  Filled 2020-03-21: qty 10

## 2020-03-21 MED ORDER — HEPARIN SOD (PORK) LOCK FLUSH 100 UNIT/ML IV SOLN
500.0000 [IU] | Freq: Once | INTRAVENOUS | Status: AC | PRN
  Administered 2020-03-21: 13:00:00 500 [IU]
  Filled 2020-03-21: qty 5

## 2020-03-21 NOTE — Patient Instructions (Signed)
[facility] Discharge Instructions for Patients Receiving Chemotherapy  Today you received the following chemotherapy agents taxol  To help prevent nausea and vomiting after your treatment, we encourage you to take your nausea medication as directed   If you develop nausea and vomiting that is not controlled by your nausea medication, call the clinic.   BELOW ARE SYMPTOMS THAT SHOULD BE REPORTED IMMEDIATELY:  *FEVER GREATER THAN 100.5 F  *CHILLS WITH OR WITHOUT FEVER  NAUSEA AND VOMITING THAT IS NOT CONTROLLED WITH YOUR NAUSEA MEDICATION  *UNUSUAL SHORTNESS OF BREATH  *UNUSUAL BRUISING OR BLEEDING  TENDERNESS IN MOUTH AND THROAT WITH OR WITHOUT PRESENCE OF ULCERS  *URINARY PROBLEMS  *BOWEL PROBLEMS  UNUSUAL RASH Items with * indicate a potential emergency and should be followed up as soon as possible.  Feel free to call the clinic should you have any questions or concerns. The clinic phone number is [phone].  Please show the CHEMO ALERT CARD at check-in to the Emergency Department and triage nurse.   

## 2020-03-24 ENCOUNTER — Ambulatory Visit
Admission: RE | Admit: 2020-03-24 | Discharge: 2020-03-24 | Disposition: A | Discharge status: Home / Self Care | Payer: BLUE CROSS/BLUE SHIELD | Source: Ambulatory Visit | Attending: General Surgery | Admitting: General Surgery

## 2020-03-24 DIAGNOSIS — D249 Benign neoplasm of unspecified breast: Secondary | ICD-10-CM

## 2020-03-24 MED ORDER — GADOBENATE DIMEGLUMINE 529 MG/ML IV SOLN
10.0000 mL | Freq: Once | INTRAVENOUS | Status: AC | PRN
  Administered 2020-03-24: 10 mL via INTRAVENOUS

## 2020-03-25 ENCOUNTER — Encounter: Payer: Self-pay | Admitting: *Deleted

## 2020-03-27 NOTE — Progress Notes (Signed)
[place]   [phone] [phone]   Clinic Follow up Note   Patient Care Team: Burnett Sheng, MD as PCP - General (Family Medicine) Mauro Kaufmann, RN as Oncology Nurse Navigator Rockwell Germany, RN as Oncology Nurse Navigator Jovita Kussmaul, MD as Consulting Physician (General Surgery) Truitt Merle, MD as Consulting Physician (Hematology) Eppie Gibson, MD as Attending Physician (Radiation Oncology)  Date of Service:  03/28/2020  CHIEF COMPLAINT: F/u of right breast cancer  SUMMARY OF ONCOLOGIC HISTORY: Oncology History Overview Note  Cancer Staging Malignant neoplasm of upper-outer quadrant of right breast in female, estrogen receptor positive ([place]) Staging form: Breast, AJCC 8th Edition - Clinical stage from 08/28/2019: Stage IB (cT2, cN0, cM0, G2, ER+, PR+, HER2-) - Signed by Truitt Merle, MD on 09/04/2019 - Pathologic stage from 09/24/2019: Stage IB (pT2, pN1a, cM0, G2, ER+, PR+, HER2-) - Signed by Truitt Merle, MD on 10/10/2019    Malignant neoplasm of upper-outer quadrant of right breast in female, estrogen receptor positive ([place])  08/24/2019 Mammogram   Diagnostic mammogram and [place] 08/24/19 IMPRESSION: Suspicious right breast mass 10 o'clock 1 cm from the nipple measuring 3.3 x 1.9 x 1.9 cm and axillary adenopathy.   08/28/2019 Initial Biopsy   Diagnosis 08/28/19 1. Breast, right, needle core biopsy, 10 o'clock - INVASIVE MAMMARY CARCINOMA, GRADE II. - SEE MICROSCOPIC DESCRIPTION. 2. Lymph node, needle/core biopsy, right axilla - BENIGN LYMPH NODE. - NO METASTATIC CARCINOMA IDENTIFIED.    08/28/2019 Receptors her2   Results: IMMUNOHISTOCHEMICAL AND MORPHOMETRIC ANALYSIS PERFORMED MANUALLY The tumor cells are NEGATIVE for Her2 (1+). Estrogen Receptor: 100%, POSITIVE, STRONG STAINING INTENSITY Progesterone Receptor: 100%, POSITIVE, STRONG STAINING INTENSITY Proliferation Marker Ki67: 10%   08/28/2019 Cancer Staging   Staging  form: Breast, AJCC 8th Edition - Clinical stage from 08/28/2019: Stage IB (cT2, cN0, cM0, G2, ER+, PR+, HER2-) - Signed by Truitt Merle, MD on 09/04/2019   08/31/2019 Initial Diagnosis   Malignant neoplasm of upper-outer quadrant of right breast in female, estrogen receptor positive ([place])   09/07/2019 Breast MRI   Breast MRI 09/07/19  IMPRESSION: 1. 2.5 cm known malignancy in the anterior right breast, superficial depth near the nipple. 2. 7-8 mm indeterminate mass in the central upper outer quadrant of the right breast. 3. One right axillary lymph node with apparent mild cortical thickening, which lies slightly anterior and inferior to the biopsied right axillary lymph node ( benign reactive on pathology). This is most likely also benign given the pathology from the adjacent biopsied lymph node. 4. No evidence of left breast malignancy.   09/17/2019 Imaging   [place] right breast 09/17/19  IMPRESSION: Several benign appearing masses in the upper outer quadrant of the right breast without a definite correlate to the finding seen on prior MRI. The right axillary lymph nodes appear similar to the recently biopsied benign lymph node.   09/20/2019 Pathology Results   Diagnosis 09/20/19  Breast, right, needle core biopsy, upper outer quadrant - FIBROADENOMA - NO MALIGNANCY IDENTIFIED    09/21/2019 Genetic Testing   Negative genetic testing:  No pathogenic variants detected on the [facility] Breast Cancer STAT panel and Common Hereditary Cancers panel.  A variant of uncertain significance was identified in the BRCA1 gene, called c.2048A>G ([id number]).  The report date is 09/21/2019.  The STAT Breast cancer panel offered by [facility] includes sequencing and rearrangement analysis for the following 9 genes:  ATM, BRCA1, BRCA2, CDH1, CHEK2, PALB2, PTEN, STK11 and TP53.   The Common  Hereditary Cancers Panel offered by [facility] includes sequencing and/or deletion duplication testing of the following 48  genes: APC, ATM, AXIN2, BARD1, BMPR1A, BRCA1, BRCA2, BRIP1, CDH1, CDK4, CDKN2A (p14ARF), CDKN2A (p16INK4a), CHEK2, CTNNA1, DICER1, EPCAM (Deletion/duplication testing only), GREM1 (promoter region deletion/duplication testing only), KIT, MEN1, MLH1, MSH2, MSH3, MSH6, MUTYH, NBN, NF1, NHTL1, PALB2, PDGFRA, PMS2, POLD1, POLE, PTEN, RAD50, RAD51C, RAD51D, RNF43, SDHB, SDHC, SDHD, SMAD4, SMARCA4. STK11, TP53, TSC1, TSC2, and VHL.  The following genes were evaluated for sequence changes only: SDHA and HOXB13 c.251G>A variant only.    09/24/2019 Surgery   RIGHT BREAST LUMPECTOMY WITH SENTINEL LYMPH NODE BIOPSY by Dr Toth  09/24/19    09/24/2019 Pathology Results   FINAL MICROSCOPIC DIAGNOSIS: 09/24/19   A. LYMPH NODE, RIGHT #1, SENTINEL, BIOPSY:  - There is no evidence of carcinoma in 1 of 1 lymph node (0/1).   B. LYMPH NODE, RIGHT, SENTINEL, BIOPSY:  - There is no evidence of carcinoma in 1 of 1 lymph node (0/1).   C. 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   D. LYMPH NODE, RIGHT #2, SENTINEL, BIOPSY:  - There is no evidence of carcinoma in 1 of 1 lymph node (0/1).   E. LYMPH NODE, RIGHT #3 , SENTINEL, BIOPSY:  - There is no evidence of carcinoma in 1 of 1 lymph node (0/1).   F. LYMPH NODE, RIGHT, SENTINEL, BIOPSY:  - There is no evidence of carcinoma in 1 of 1 lymph node (0/1).   G. LYMPH NODE, RIGHT, SENTINEL, BIOPSY:  -There is no evidence of carcinoma in 1 of 1 lymph node (0/1).   H. 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  - Ductal carcinoma in situ, intermediate grade.  - Perineural invasion is identified.  - The surgical resection margins are negative for carcinoma.  - See oncology table below   I. BREAST, RIGHT ADDITIONAL INFERIOR MARGIN, EXCISION:  - Fibrocystic changes.  - There is no evidence of malignancy.     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 Postoperative findings in the right breast and axilla. Upper normal sized right axillary lymph nodes, nonspecific. 2. 2 mm superior apical segment right upper lobe nodule is technically nonspecific although statistically likely to be benign. 3. 3.6 cm hypodense right adnexal structure, probably a cyst but technically nonspecific. If patient is premenopausal, no follow up is warranted; if early postmenopausal, follow up ultrasound in 6-12 months would be recommended. 4. Other imaging findings of potential clinical significance: Small type 1 hiatal hernia. Descending and sigmoid colon diverticulosis. Small indirect left inguinal hernia contains adipose tissue.   Aortic Atherosclerosis (ICD10-I70.0).   10/26/2019 Imaging   Whole Body Bone scan 10/26/19  IMPRESSION: Nonspecific sites of uptake at the RIGHT ischium and anterior   LEFT sixth rib, cannot exclude metastatic foci.   Otherwise negative exam.     10/29/2019 - 04/04/2020 Chemotherapy   Adjuvant AC q2weeks for 4 cycles starting 10/29/19-12/21/19 followed by weekly Taxol for 12 weeks starting 01/11/20. Taxol reduced 30% starting with C6. Completed on 04/04/20   03/24/2020 Breast MRI   IMPRESSION: 1. Postsurgical changes in the right breast and right axilla. 2. No MRI evidence of malignancy in either breast.   RECOMMENDATION: Diagnostic  bilateral mammogram in September 2021.   BI-RADS CATEGORY  2: Benign.      CURRENT THERAPY:  Adjuvant AC q2weeks for 4 cycles starting 10/29/19-2/5/21followed by weekly Taxol for 12 weeksstarting 01/11/20.Taxol reduced 30% starting with C6. Completed on 04/04/20  INTERVAL HISTORY:  Paula Davidson is here for a follow up and treatment. She presents to the clinic alone. She notes her last few treatments went well. She notes neuropathy is mainly in her lateral 3 toes with slight numbness. She has no issues walking or driving. She notes this flares at night. She is still using ice bags during infusion. She notes she lives near [place] [facility] under [place] where she would like to get her Radiation Treatment. She notes she has not had a period since her IUD was removed and she started chemo.     REVIEW OF SYSTEMS:   Constitutional: Denies fevers, chills or abnormal weight loss Eyes: Denies blurriness of vision Ears, nose, mouth, throat, and face: Denies mucositis or sore throat Respiratory: Denies cough, dyspnea or wheezes Cardiovascular: Denies palpitation, chest discomfort or lower extremity swelling Gastrointestinal:  Denies nausea, heartburn or change in bowel habits Skin: Denies abnormal skin rashes Lymphatics: Denies new lymphadenopathy or easy bruising Neurological:Denies numbness, tingling or new weaknesses Behavioral/Psych: Mood is stable, no new changes  All other systems were reviewed with the patient and are negative.  MEDICAL HISTORY:  Past Medical History:  Diagnosis Date  . Anxiety   . Cancer ([place]) 09/24/2019   right breast cancer-had lumpectomy  . Depression   . DUB (dysfunctional uterine bleeding)   . Family history of pancreatic cancer   . Hypertension     SURGICAL HISTORY: Past Surgical History:  Procedure Laterality Date  . BREAST LUMPECTOMY WITH AXILLARY LYMPH NODE BIOPSY Right 09/24/2019   Procedure: RIGHT BREAST LUMPECTOMY WITH SENTINEL LYMPH NODE  BIOPSY;  Surgeon: Jovita Kussmaul, MD;  Location: [facility];  Service: General;  Laterality: Right;  . BREAST SURGERY  09/24/2019   Breast lump excised  . FOOT SURGERY    . LAPAROSCOPIC GASTRIC BANDING    . PORTACATH PLACEMENT N/A 10/25/2019   Procedure: INSERTION PORT-A-CATH WITH POSSIBLE ULTRASOUND;  Surgeon: Jovita Kussmaul, MD;  Location: [facility];  Service: General;  Laterality: N/A;  . WISDOM TOOTH EXTRACTION      I have reviewed the social history and family history with the patient and they are unchanged from previous note.  ALLERGIES:  is allergic to hydrochlorothiazide; codeine; and lisinopril.  MEDICATIONS:  Current Outpatient Medications  Medication Sig Dispense Refill  . amLODipine (NORVASC) 10 MG tablet Take 10 mg by mouth daily.    Marland Kitchen Bioflavonoid Products (ESTER C PO) Take 1 tablet by mouth daily.    . Calcium Carbonate-Vitamin D (CALCIUM + D PO) Take 1 tablet by mouth daily.     . cetirizine (ZYRTEC) 10 MG tablet Take 10 mg by mouth daily as needed for allergies.    . cholecalciferol (VITAMIN D) 25 MCG (1000 UT) tablet Take 1,000  Units by mouth daily.    . citalopram (CELEXA) 20 MG tablet Take 20 mg by mouth daily.    Marland Kitchen gabapentin (NEURONTIN) 100 MG capsule Take 1 capsule (100 mg total) by mouth at bedtime. Increase to 2 capsule in a week, and 3 capsule in two weeks, if she tolerates well 90 capsule 1  . HYDROcodone-acetaminophen (NORCO/VICODIN) 5-325 MG tablet Take 1-2 tablets by mouth every 6 (six) hours as needed for moderate pain or severe pain. (Patient not taking: Reported on 02/29/2020) 10 tablet 0  . lidocaine-prilocaine (EMLA) cream Apply to affected area once 30 g 3  . LORazepam (ATIVAN) 0.5 MG tablet Take 1-2 tablets (0.5-1 mg total) by mouth every 8 (eight) hours as needed for anxiety (nausea). 20 tablet 0  . losartan (COZAAR) 100 MG tablet Take 100 mg by mouth daily.    . Multiple Vitamin (MULTIVITAMIN WITH MINERALS) TABS tablet Take 1 tablet by  mouth daily.    . ondansetron (ZOFRAN) 8 MG tablet Take 1 tablet (8 mg total) by mouth 2 (two) times daily as needed. Start on the third day after chemotherapy. (Patient not taking: Reported on 02/29/2020) 30 tablet 1  . prochlorperazine (COMPAZINE) 10 MG tablet Take 1 tablet (10 mg total) by mouth every 6 (six) hours as needed (Nausea or vomiting). (Patient not taking: Reported on 02/29/2020) 30 tablet 1  . promethazine (PHENERGAN) 25 MG tablet Take 1 tablet (25 mg total) by mouth every 6 (six) hours as needed for nausea or vomiting. (Patient not taking: Reported on 02/29/2020) 30 tablet 0  . traZODone (DESYREL) 50 MG tablet Take 1 tablet (50 mg total) by mouth at bedtime as needed for sleep. 30 tablet 1   No current facility-administered medications for this visit.   Facility-Administered Medications Ordered in Other Visits  Medication Dose Route Frequency Provider Last Rate Last Admin  . heparin lock flush 100 unit/mL  500 Units Intracatheter Once PRN Truitt Merle, MD      . PACLitaxel (TAXOL) 126 mg in sodium chloride 0.9 % 250 mL chemo infusion (</= '80mg'$ /m2)  50 mg/m2 (Order-Specific) Intravenous Once Truitt Merle, MD 271 mL/hr at 03/28/20 1043 Rate Verify at 03/28/20 1043  . sodium chloride flush (NS) 0.9 % injection 10 mL  10 mL Intracatheter PRN Truitt Merle, MD        PHYSICAL EXAMINATION: ECOG PERFORMANCE STATUS: 1 - Symptomatic but completely ambulatory  Vitals:   03/28/20 0841  BP: 129/79  Pulse: 84  Resp: 18  Temp: 98.2 F (36.8 C)  SpO2: 100%   Filed Weights   03/28/20 0841  Weight: 293 lb 14.4 oz (133.3 kg)    GENERAL:alert, no distress and comfortable SKIN: skin color, texture, turgor are normal, no rashes or significant lesions EYES: normal, Conjunctiva are pink and non-injected, sclera clear  NECK: supple, thyroid normal size, non-tender, without nodularity LYMPH:  no palpable lymphadenopathy in the cervical, axillary  LUNGS: clear to auscultation and percussion with normal  breathing effort HEART: regular rate & rhythm and no murmurs and no lower extremity edema ABDOMEN:abdomen soft, non-tender and normal bowel sounds Musculoskeletal:no cyanosis of digits and no clubbing  NEURO: alert & oriented x 3 with fluent speech, no focal motor/sensory deficits BREAST: Right Lumpectomy surgical incision healed well.   LABORATORY DATA:  I have reviewed the data as listed CBC Latest Ref Rng & Units 03/28/2020 03/21/2020 03/14/2020  WBC 4.0 - 10.5 K/uL 5.3 5.2 5.5  Hemoglobin 12.0 - 15.0 g/dL 10.7(L) 10.1(L) 10.2(L)  Hematocrit  36.0 - 46.0 % 33.2(L) 31.8(L) 31.8(L)  Platelets 150 - 400 K/uL 391 400 401(H)     CMP Latest Ref Rng & Units 03/28/2020 03/21/2020 03/14/2020  Glucose 70 - 99 mg/dL 96 98 101(H)  BUN 6 - 20 mg/dL '8 8 10  '$ Creatinine 0.44 - 1.00 mg/dL 0.74 0.68 0.70  Sodium 135 - 145 mmol/L 139 142 140  Potassium 3.5 - 5.1 mmol/L 4.0 4.1 3.9  Chloride 98 - 111 mmol/L 106 107 106  CO2 22 - 32 mmol/L '24 24 25  '$ Calcium 8.9 - 10.3 mg/dL 9.7 9.7 9.7  Total Protein 6.5 - 8.1 g/dL 7.3 7.1 7.3  Total Bilirubin 0.3 - 1.2 mg/dL 0.3 0.2(L) 0.3  Alkaline Phos 38 - 126 U/L 50 51 49  AST 15 - 41 U/L '18 18 19  '$ ALT 0 - 44 U/L '26 27 26      '$ RADIOGRAPHIC STUDIES: I have personally reviewed the radiological images as listed and agreed with the findings in the report. No results found.   ASSESSMENT & PLAN:  Abeeha Twist is a 49 y.o. 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 Shehas had a right breast mass for 7-8 yearsand recently grew.Her [place] and Biopsy show grade II invasive mammary carcinoma of right breast and lymph node negative.MRI showed tumor 2.5cm, 1 positive axillary LN and a 7-61m indeterminate mass that biopsies and found to be Fibroadenoma.  -She underwent right lumpectomy and SLNB with Dr. TMarlou Starkson 09/24/19.Her mammaprint showedHigh Risk Luminal Type B  with 29% risk of recurrence in 10 years. -To reduce her high risk adjuvant chemoI started her on adjuvantIV chemoAC q2weeks for 4 cycles12/14/20-2/5/21with C4 at 25% followed by weekly Taxol for 12 weeks starting 01/11/20. -After chemo she will proceed with Adjuvant Radiation and antiestrogen therapy to further reduce her risk of recurrence.  -S/p week 10 she is tolerating Taxol well with mild numbness in her feet, mainly at night. Her f/u breast MRI from 03/24/20 shows no evidence of malignancy.  -Labs reviewed, Overall adequate to proceed with C11 Taxol. She will proceed with final Taxol next week.  -I discussed she is fine to have her PAC removed after she completes chemo. If she does not proceed with she will continue Port flushes every 6-8 weeks.  -I discussed she will proceed with adjuvant Radiation next. [place] request [place]  -F/u in early August to discuss start of antiestrogen therapy and breast cancer Surveillance. She notes her period did not restart after IUD was removed and chemo was started. Will check her hormonal level at next visit.     2. Neuropathy G2, Recent Fall  -S/p C5 Taxol she had a fall because she could not feel her feet and realized this is from neuropathy.  -She has numbness mainly in her feet with minimal tingling in her toes and fingers. Her symptoms and pain are more present at night and can effect her sleep.  -To reduce long term effects I will dose reduce her Taxol starting with C6. If worsens may not complete all 12 cycles. Continue Cryotherapy.  -I previously called in Gabapentin '100mg'$  to start once nightly and titrate up to 3 times nightly. -I also recommend she use OTC B complex and continues to exercise. She is fine to use foot spa a few days after chemo.  3. 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  Menorrhagia  -Shehassevere menorrhagia with long term bleeding. Shepreviouslyrequired IV iron.Her Gyn started Mirena 5 years ago which stopped her periods.  -She had Mirena removed in 08/2019. Her period has not returned yet. -Given her age she may start menopause soon. Will test her hormonal level in the near futureif she has no menstrual perioddoes not return.  -She has h/o of ovarian cyst. She currently has one of right ovary as seen on 10/26/19 CT scan. Will f/u with Gyn. -She has not had period on chemo so far.   5. HTN, Depression, Anxiety, obesity -On Amlodipine, Losartan, Lopressorand Celexa -She notes she lives with [place].  6. Anemia  -Has been secondary to menorrhagia in the past, now secondary to chemo  -Has been slowly trending down. Hgstable lately.   7. Insomnia  -She had trouble sleeping prior to her cancer diagnosis.  -She notes she has tried Melatonin that has not helped and Amitriptyline takes too long to work  -She has been using Ativan as needed which can get her 6 hours. She has been getting better sleep with Trazadone, will continue.  -I advised her to not take Celexa and Trazodone at the same time given small drug interaction. She understands.   PLAN: -Labs reviewed and adequate to proceed with week11Taxol today at same reduced dose  -Lab, flush, chemo Taxol in 1 week (last dose) -Refer to [facility] [facility] in [place].  I will communicate with Dr. Isidore Moos  -F/u in 3 months  -will send note to Dr. Marlou Starks for port removal   No problem-specific Assessment & Plan notes found for this encounter.   No orders of the defined types were placed in this encounter.  All questions were answered. The patient knows to call the clinic with any problems, questions or concerns. No barriers to learning was detected. The total time spent in the appointment was 30 minutes.     Truitt Merle, MD 03/28/2020   I, Joslyn Devon, am acting as  scribe for Truitt Merle, MD.   I have reviewed the above documentation for accuracy and completeness, and I agree with the above.

## 2020-03-28 ENCOUNTER — Inpatient Hospital Stay: Payer: BLUE CROSS/BLUE SHIELD

## 2020-03-28 ENCOUNTER — Encounter: Payer: Self-pay | Admitting: Hematology

## 2020-03-28 ENCOUNTER — Other Ambulatory Visit: Payer: Self-pay

## 2020-03-28 ENCOUNTER — Inpatient Hospital Stay (HOSPITAL_BASED_OUTPATIENT_CLINIC_OR_DEPARTMENT_OTHER): Payer: BLUE CROSS/BLUE SHIELD | Admitting: Hematology

## 2020-03-28 ENCOUNTER — Other Ambulatory Visit: Payer: Self-pay | Admitting: *Deleted

## 2020-03-28 ENCOUNTER — Encounter: Payer: Self-pay | Admitting: *Deleted

## 2020-03-28 VITALS — BP 129/79 | HR 84 | Temp 98.2°F | Resp 18 | Ht 66.0 in | Wt 293.9 lb

## 2020-03-28 DIAGNOSIS — Z95828 Presence of other vascular implants and grafts: Secondary | ICD-10-CM

## 2020-03-28 DIAGNOSIS — Z170 Estrogen receptor positive status [ER+]: Secondary | ICD-10-CM

## 2020-03-28 DIAGNOSIS — I10 Essential (primary) hypertension: Secondary | ICD-10-CM

## 2020-03-28 DIAGNOSIS — C50411 Malignant neoplasm of upper-outer quadrant of right female breast: Secondary | ICD-10-CM

## 2020-03-28 LAB — CBC WITH DIFFERENTIAL (CANCER CENTER ONLY)
Abs Immature Granulocytes: 0.02 10*3/uL (ref 0.00–0.07)
Basophils Absolute: 0 10*3/uL (ref 0.0–0.1)
Basophils Relative: 1 %
Eosinophils Absolute: 0.1 10*3/uL (ref 0.0–0.5)
Eosinophils Relative: 3 %
HCT: 33.2 % — ABNORMAL LOW (ref 36.0–46.0)
Hemoglobin: 10.7 g/dL — ABNORMAL LOW (ref 12.0–15.0)
Immature Granulocytes: 0 %
Lymphocytes Relative: 24 %
Lymphs Abs: 1.3 10*3/uL (ref 0.7–4.0)
MCH: 28.7 pg (ref 26.0–34.0)
MCHC: 32.2 g/dL (ref 30.0–36.0)
MCV: 89 fL (ref 80.0–100.0)
Monocytes Absolute: 0.5 10*3/uL (ref 0.1–1.0)
Monocytes Relative: 9 %
Neutro Abs: 3.4 10*3/uL (ref 1.7–7.7)
Neutrophils Relative %: 63 %
Platelet Count: 391 10*3/uL (ref 150–400)
RBC: 3.73 MIL/uL — ABNORMAL LOW (ref 3.87–5.11)
RDW: 15.4 % (ref 11.5–15.5)
WBC Count: 5.3 10*3/uL (ref 4.0–10.5)
nRBC: 0 % (ref 0.0–0.2)

## 2020-03-28 LAB — CMP (CANCER CENTER ONLY)
ALT: 26 U/L (ref 0–44)
AST: 18 U/L (ref 15–41)
Albumin: 3.7 g/dL (ref 3.5–5.0)
Alkaline Phosphatase: 50 U/L (ref 38–126)
Anion gap: 9 (ref 5–15)
BUN: 8 mg/dL (ref 6–20)
CO2: 24 mmol/L (ref 22–32)
Calcium: 9.7 mg/dL (ref 8.9–10.3)
Chloride: 106 mmol/L (ref 98–111)
Creatinine: 0.74 mg/dL (ref 0.44–1.00)
GFR, Est AFR Am: >60 mL/min (ref >60)
GFR, Estimated: >60 mL/min (ref >60)
Glucose, Bld: 96 mg/dL (ref 70–99)
Potassium: 4 mmol/L (ref 3.5–5.1)
Sodium: 139 mmol/L (ref 135–145)
Total Bilirubin: 0.3 mg/dL (ref 0.3–1.2)
Total Protein: 7.3 g/dL (ref 6.5–8.1)

## 2020-03-28 MED ORDER — SODIUM CHLORIDE 0.9% FLUSH
10.0000 mL | Freq: Once | INTRAVENOUS | Status: AC
  Administered 2020-03-28: 10 mL
  Filled 2020-03-28: qty 10

## 2020-03-28 MED ORDER — SODIUM CHLORIDE 0.9% FLUSH
10.0000 mL | INTRAVENOUS | Status: DC | PRN
  Administered 2020-03-28: 10 mL
  Filled 2020-03-28: qty 10

## 2020-03-28 MED ORDER — DIPHENHYDRAMINE HCL 50 MG/ML IJ SOLN
INTRAMUSCULAR | Status: AC
  Filled 2020-03-28: qty 1

## 2020-03-28 MED ORDER — FAMOTIDINE IN NACL 20-0.9 MG/50ML-% IV SOLN
20.0000 mg | Freq: Once | INTRAVENOUS | Status: AC
  Administered 2020-03-28: 20 mg via INTRAVENOUS

## 2020-03-28 MED ORDER — HEPARIN SOD (PORK) LOCK FLUSH 100 UNIT/ML IV SOLN
500.0000 [IU] | Freq: Once | INTRAVENOUS | Status: AC | PRN
  Administered 2020-03-28: 500 [IU]
  Filled 2020-03-28: qty 5

## 2020-03-28 MED ORDER — DIPHENHYDRAMINE HCL 50 MG/ML IJ SOLN
25.0000 mg | Freq: Once | INTRAMUSCULAR | Status: AC
  Administered 2020-03-28: 25 mg via INTRAVENOUS

## 2020-03-28 MED ORDER — SODIUM CHLORIDE 0.9 % IV SOLN
50.0000 mg/m2 | Freq: Once | INTRAVENOUS | Status: AC
  Administered 2020-03-28: 126 mg via INTRAVENOUS
  Filled 2020-03-28: qty 21

## 2020-03-28 MED ORDER — SODIUM CHLORIDE 0.9 % IV SOLN
10.0000 mg | Freq: Once | INTRAVENOUS | Status: AC
  Administered 2020-03-28: 10 mg via INTRAVENOUS
  Filled 2020-03-28: qty 10

## 2020-03-28 MED ORDER — SODIUM CHLORIDE 0.9 % IV SOLN
Freq: Once | INTRAVENOUS | Status: AC
  Filled 2020-03-28: qty 250

## 2020-03-28 MED ORDER — FAMOTIDINE IN NACL 20-0.9 MG/50ML-% IV SOLN
INTRAVENOUS | Status: AC
  Filled 2020-03-28: qty 50

## 2020-03-28 NOTE — Patient Instructions (Signed)
[facility] Discharge Instructions for Patients Receiving Chemotherapy  Today you received the following chemotherapy agents taxol  To help prevent nausea and vomiting after your treatment, we encourage you to take your nausea medication as directed   If you develop nausea and vomiting that is not controlled by your nausea medication, call the clinic.   BELOW ARE SYMPTOMS THAT SHOULD BE REPORTED IMMEDIATELY:  *FEVER GREATER THAN 100.5 F  *CHILLS WITH OR WITHOUT FEVER  NAUSEA AND VOMITING THAT IS NOT CONTROLLED WITH YOUR NAUSEA MEDICATION  *UNUSUAL SHORTNESS OF BREATH  *UNUSUAL BRUISING OR BLEEDING  TENDERNESS IN MOUTH AND THROAT WITH OR WITHOUT PRESENCE OF ULCERS  *URINARY PROBLEMS  *BOWEL PROBLEMS  UNUSUAL RASH Items with * indicate a potential emergency and should be followed up as soon as possible.  Feel free to call the clinic should you have any questions or concerns. The clinic phone number is [phone].  Please show the CHEMO ALERT CARD at check-in to the Emergency Department and triage nurse.   

## 2020-03-31 ENCOUNTER — Other Ambulatory Visit: Payer: Self-pay | Admitting: *Deleted

## 2020-03-31 ENCOUNTER — Telehealth: Payer: Self-pay | Admitting: Hematology

## 2020-03-31 DIAGNOSIS — Z170 Estrogen receptor positive status [ER+]: Secondary | ICD-10-CM

## 2020-03-31 NOTE — Telephone Encounter (Signed)
Scheduled appt per 5/14 los.  Pt will get an updated appt calendar at her next scheduled appt.

## 2020-03-31 NOTE — Progress Notes (Signed)
Pharmacist Chemotherapy Monitoring - Follow Up Assessment    I verify that I have reviewed each item in the below checklist:  . Regimen for the patient is scheduled for the appropriate day and plan matches scheduled date. Marland Kitchen Appropriate non-routine labs are ordered dependent on drug ordered. . If applicable, additional medications reviewed and ordered per protocol based on lifetime cumulative doses and/or treatment regimen.   Plan for follow-up and/or issues identified: No . I-vent associated with next due treatment: No . MD and/or nursing notified: No  Shifa Brisbon K 03/31/2020 8:51 AM

## 2020-04-04 ENCOUNTER — Inpatient Hospital Stay: Payer: BLUE CROSS/BLUE SHIELD

## 2020-04-04 ENCOUNTER — Inpatient Hospital Stay: Payer: BLUE CROSS/BLUE SHIELD | Admitting: Hematology

## 2020-04-04 ENCOUNTER — Encounter: Payer: Self-pay | Admitting: *Deleted

## 2020-04-04 ENCOUNTER — Other Ambulatory Visit: Payer: Self-pay

## 2020-04-04 VITALS — BP 131/70 | HR 73 | Temp 98.3°F | Resp 18

## 2020-04-04 DIAGNOSIS — Z170 Estrogen receptor positive status [ER+]: Secondary | ICD-10-CM

## 2020-04-04 DIAGNOSIS — Z95828 Presence of other vascular implants and grafts: Secondary | ICD-10-CM

## 2020-04-04 DIAGNOSIS — C50411 Malignant neoplasm of upper-outer quadrant of right female breast: Secondary | ICD-10-CM | POA: Diagnosis not present

## 2020-04-04 LAB — CBC WITH DIFFERENTIAL (CANCER CENTER ONLY)
Abs Immature Granulocytes: 0.03 10*3/uL (ref 0.00–0.07)
Basophils Absolute: 0 10*3/uL (ref 0.0–0.1)
Basophils Relative: 0 %
Eosinophils Absolute: 0.1 10*3/uL (ref 0.0–0.5)
Eosinophils Relative: 3 %
HCT: 33 % — ABNORMAL LOW (ref 36.0–46.0)
Hemoglobin: 10.6 g/dL — ABNORMAL LOW (ref 12.0–15.0)
Immature Granulocytes: 1 %
Lymphocytes Relative: 19 %
Lymphs Abs: 1 10*3/uL (ref 0.7–4.0)
MCH: 28 pg (ref 26.0–34.0)
MCHC: 32.1 g/dL (ref 30.0–36.0)
MCV: 87.3 fL (ref 80.0–100.0)
Monocytes Absolute: 0.5 10*3/uL (ref 0.1–1.0)
Monocytes Relative: 9 %
Neutro Abs: 3.6 10*3/uL (ref 1.7–7.7)
Neutrophils Relative %: 68 %
Platelet Count: 355 10*3/uL (ref 150–400)
RBC: 3.78 MIL/uL — ABNORMAL LOW (ref 3.87–5.11)
RDW: 15.2 % (ref 11.5–15.5)
WBC Count: 5.2 10*3/uL (ref 4.0–10.5)
nRBC: 0 % (ref 0.0–0.2)

## 2020-04-04 LAB — CMP (CANCER CENTER ONLY)
ALT: 27 U/L (ref 0–44)
AST: 19 U/L (ref 15–41)
Albumin: 3.6 g/dL (ref 3.5–5.0)
Alkaline Phosphatase: 52 U/L (ref 38–126)
Anion gap: 10 (ref 5–15)
BUN: 6 mg/dL (ref 6–20)
CO2: 25 mmol/L (ref 22–32)
Calcium: 9.8 mg/dL (ref 8.9–10.3)
Chloride: 107 mmol/L (ref 98–111)
Creatinine: 0.77 mg/dL (ref 0.44–1.00)
GFR, Est AFR Am: >60 mL/min (ref >60)
GFR, Estimated: >60 mL/min (ref >60)
Glucose, Bld: 118 mg/dL — ABNORMAL HIGH (ref 70–99)
Potassium: 3.8 mmol/L (ref 3.5–5.1)
Sodium: 142 mmol/L (ref 135–145)
Total Bilirubin: 0.2 mg/dL — ABNORMAL LOW (ref 0.3–1.2)
Total Protein: 7.1 g/dL (ref 6.5–8.1)

## 2020-04-04 MED ORDER — SODIUM CHLORIDE 0.9 % IV SOLN
10.0000 mg | Freq: Once | INTRAVENOUS | Status: AC
  Administered 2020-04-04: 10 mg via INTRAVENOUS
  Filled 2020-04-04: qty 10

## 2020-04-04 MED ORDER — SODIUM CHLORIDE 0.9% FLUSH
10.0000 mL | Freq: Once | INTRAVENOUS | Status: AC
  Administered 2020-04-04: 10 mL
  Filled 2020-04-04: qty 10

## 2020-04-04 MED ORDER — DIPHENHYDRAMINE HCL 50 MG/ML IJ SOLN
INTRAMUSCULAR | Status: AC
  Filled 2020-04-04: qty 1

## 2020-04-04 MED ORDER — DIPHENHYDRAMINE HCL 50 MG/ML IJ SOLN
25.0000 mg | Freq: Once | INTRAMUSCULAR | Status: AC
  Administered 2020-04-04: 25 mg via INTRAVENOUS

## 2020-04-04 MED ORDER — ALTEPLASE 2 MG IJ SOLR
2.0000 mg | Freq: Once | INTRAMUSCULAR | Status: AC
  Administered 2020-04-04: 2 mg
  Filled 2020-04-04: qty 2

## 2020-04-04 MED ORDER — HEPARIN SOD (PORK) LOCK FLUSH 100 UNIT/ML IV SOLN
500.0000 [IU] | Freq: Once | INTRAVENOUS | Status: AC | PRN
  Administered 2020-04-04: 500 [IU]
  Filled 2020-04-04: qty 5

## 2020-04-04 MED ORDER — FAMOTIDINE IN NACL 20-0.9 MG/50ML-% IV SOLN
20.0000 mg | Freq: Once | INTRAVENOUS | Status: AC
  Administered 2020-04-04: 20 mg via INTRAVENOUS

## 2020-04-04 MED ORDER — SODIUM CHLORIDE 0.9 % IV SOLN
50.0000 mg/m2 | Freq: Once | INTRAVENOUS | Status: AC
  Administered 2020-04-04: 126 mg via INTRAVENOUS
  Filled 2020-04-04: qty 21

## 2020-04-04 MED ORDER — FAMOTIDINE IN NACL 20-0.9 MG/50ML-% IV SOLN
INTRAVENOUS | Status: AC
  Filled 2020-04-04: qty 50

## 2020-04-04 MED ORDER — SODIUM CHLORIDE 0.9 % IV SOLN
Freq: Once | INTRAVENOUS | Status: AC
  Filled 2020-04-04: qty 250

## 2020-04-04 MED ORDER — ALTEPLASE 2 MG IJ SOLR
INTRAMUSCULAR | Status: AC
  Filled 2020-04-04: qty 2

## 2020-04-04 MED ORDER — SODIUM CHLORIDE 0.9% FLUSH
10.0000 mL | INTRAVENOUS | Status: DC | PRN
  Administered 2020-04-04: 10 mL
  Filled 2020-04-04: qty 10

## 2020-04-04 NOTE — Patient Instructions (Signed)
[facility] Discharge Instructions for Patients Receiving Chemotherapy  Today you received the following chemotherapy agents taxol  To help prevent nausea and vomiting after your treatment, we encourage you to take your nausea medication as directed   If you develop nausea and vomiting that is not controlled by your nausea medication, call the clinic.   BELOW ARE SYMPTOMS THAT SHOULD BE REPORTED IMMEDIATELY:  *FEVER GREATER THAN 100.5 F  *CHILLS WITH OR WITHOUT FEVER  NAUSEA AND VOMITING THAT IS NOT CONTROLLED WITH YOUR NAUSEA MEDICATION  *UNUSUAL SHORTNESS OF BREATH  *UNUSUAL BRUISING OR BLEEDING  TENDERNESS IN MOUTH AND THROAT WITH OR WITHOUT PRESENCE OF ULCERS  *URINARY PROBLEMS  *BOWEL PROBLEMS  UNUSUAL RASH Items with * indicate a potential emergency and should be followed up as soon as possible.  Feel free to call the clinic should you have any questions or concerns. The clinic phone number is [phone].  Please show the CHEMO ALERT CARD at check-in to the Emergency Department and triage nurse.   

## 2020-04-04 NOTE — Progress Notes (Signed)
CATHFLO given by Delle Reining, RN at [phone]. I drew blood peripherally.

## 2020-04-04 NOTE — Patient Instructions (Signed)

## 2020-04-07 ENCOUNTER — Encounter: Payer: Self-pay | Admitting: *Deleted

## 2020-04-12 ENCOUNTER — Other Ambulatory Visit: Payer: Self-pay | Admitting: Hematology

## 2020-04-16 ENCOUNTER — Encounter: Payer: Self-pay | Admitting: Hematology

## 2020-04-30 ENCOUNTER — Encounter: Payer: Self-pay | Admitting: *Deleted

## 2020-05-13 ENCOUNTER — Encounter: Payer: Self-pay | Admitting: *Deleted

## 2020-05-15 ENCOUNTER — Other Ambulatory Visit: Payer: Self-pay | Admitting: Hematology

## 2020-05-15 MED ORDER — TRAZODONE HCL 100 MG PO TABS: 100.0000 mg | ORAL_TABLET | Freq: Every day | ORAL | 2 refills | Status: DC

## 2020-05-15 NOTE — Telephone Encounter (Signed)
I spoke with Ms Paolillo.  She has a refill for her gabapentin.  She is currently taking 4 capsules at bedtime.  She is requesting a refill for trazodone.

## 2020-05-19 ENCOUNTER — Other Ambulatory Visit: Payer: Self-pay | Admitting: Hematology

## 2020-06-18 ENCOUNTER — Encounter: Payer: Self-pay | Admitting: *Deleted

## 2020-06-21 ENCOUNTER — Other Ambulatory Visit: Payer: Self-pay | Admitting: Hematology

## 2020-06-24 ENCOUNTER — Encounter: Payer: Self-pay | Admitting: *Deleted

## 2020-06-25 NOTE — Progress Notes (Signed)
[place]   [phone]) [phone] Fax:(336) [phone]   Clinic Follow up Note   Patient Care Team: Burnett Sheng, MD as PCP - General (Family Medicine) Mauro Kaufmann, RN as Oncology Nurse Navigator Rockwell Germany, RN as Oncology Nurse Navigator Jovita Kussmaul, MD as Consulting Physician (General Surgery) Truitt Merle, MD as Consulting Physician (Hematology) Eppie Gibson, MD as Attending Physician (Radiation Oncology)  Date of Service:  06/27/2020  CHIEF COMPLAINT: F/u of right breast cancer  SUMMARY OF ONCOLOGIC HISTORY: Oncology History Overview Note  Cancer Staging Malignant neoplasm of upper-outer quadrant of right breast in female, estrogen receptor positive ([place]) Staging form: Breast, AJCC 8th Edition - Clinical stage from 08/28/2019: Stage IB (cT2, cN0, cM0, G2, ER+, PR+, HER2-) - Signed by Truitt Merle, MD on 09/04/2019 - Pathologic stage from 09/24/2019: Stage IB (pT2, pN1a, cM0, G2, ER+, PR+, HER2-) - Signed by Truitt Merle, MD on 10/10/2019    Malignant neoplasm of upper-outer quadrant of right breast in female, estrogen receptor positive ([place])  08/24/2019 Mammogram   Diagnostic mammogram and [place] 08/24/19 IMPRESSION: Suspicious right breast mass 10 o'clock 1 cm from the nipple measuring 3.3 x 1.9 x 1.9 cm and axillary adenopathy.   08/28/2019 Initial Biopsy   Diagnosis 08/28/19 1. Breast, right, needle core biopsy, 10 o'clock - INVASIVE MAMMARY CARCINOMA, GRADE II. - SEE MICROSCOPIC DESCRIPTION. 2. Lymph node, needle/core biopsy, right axilla - BENIGN LYMPH NODE. - NO METASTATIC CARCINOMA IDENTIFIED.    08/28/2019 Receptors her2   Results: IMMUNOHISTOCHEMICAL AND MORPHOMETRIC ANALYSIS PERFORMED MANUALLY The tumor cells are NEGATIVE for Her2 (1+). Estrogen Receptor: 100%, POSITIVE, STRONG STAINING INTENSITY Progesterone Receptor: 100%, POSITIVE, STRONG STAINING INTENSITY Proliferation Marker Ki67: 10%   08/28/2019 Cancer Staging   Staging  form: Breast, AJCC 8th Edition - Clinical stage from 08/28/2019: Stage IB (cT2, cN0, cM0, G2, ER+, PR+, HER2-) - Signed by Truitt Merle, MD on 09/04/2019   08/31/2019 Initial Diagnosis   Malignant neoplasm of upper-outer quadrant of right breast in female, estrogen receptor positive ([place])   09/07/2019 Breast MRI   Breast MRI 09/07/19  IMPRESSION: 1. 2.5 cm known malignancy in the anterior right breast, superficial depth near the nipple. 2. 7-8 mm indeterminate mass in the central upper outer quadrant of the right breast. 3. One right axillary lymph node with apparent mild cortical thickening, which lies slightly anterior and inferior to the biopsied right axillary lymph node ( benign reactive on pathology). This is most likely also benign given the pathology from the adjacent biopsied lymph node. 4. No evidence of left breast malignancy.   09/17/2019 Imaging   [place] right breast 09/17/19  IMPRESSION: Several benign appearing masses in the upper outer quadrant of the right breast without a definite correlate to the finding seen on prior MRI. The right axillary lymph nodes appear similar to the recently biopsied benign lymph node.   09/20/2019 Pathology Results   Diagnosis 09/20/19  Breast, right, needle core biopsy, upper outer quadrant - FIBROADENOMA - NO MALIGNANCY IDENTIFIED    09/21/2019 Genetic Testing   Negative genetic testing:  No pathogenic variants detected on the [facility] Breast Cancer STAT panel and Common Hereditary Cancers panel.  A variant of uncertain significance was identified in the BRCA1 gene, called c.2048A>G ([id number]).  The report date is 09/21/2019.  The STAT Breast cancer panel offered by [facility] includes sequencing and rearrangement analysis for the following 9 genes:  ATM, BRCA1, BRCA2, CDH1, CHEK2, PALB2, PTEN, STK11 and TP53.   The Common  Hereditary Cancers Panel offered by [facility] includes sequencing and/or deletion duplication testing of the following 48  genes: APC, ATM, AXIN2, BARD1, BMPR1A, BRCA1, BRCA2, BRIP1, CDH1, CDK4, CDKN2A (p14ARF), CDKN2A (p16INK4a), CHEK2, CTNNA1, DICER1, EPCAM (Deletion/duplication testing only), GREM1 (promoter region deletion/duplication testing only), KIT, MEN1, MLH1, MSH2, MSH3, MSH6, MUTYH, NBN, NF1, NHTL1, PALB2, PDGFRA, PMS2, POLD1, POLE, PTEN, RAD50, RAD51C, RAD51D, RNF43, SDHB, SDHC, SDHD, SMAD4, SMARCA4. STK11, TP53, TSC1, TSC2, and VHL.  The following genes were evaluated for sequence changes only: SDHA and HOXB13 c.251G>A variant only.    09/24/2019 Surgery   RIGHT BREAST LUMPECTOMY WITH SENTINEL LYMPH NODE BIOPSY by Dr Marlou Starks  09/24/19    09/24/2019 Pathology Results   FINAL MICROSCOPIC DIAGNOSIS: 09/24/19   A. LYMPH NODE, RIGHT #1, SENTINEL, BIOPSY:  - There is no evidence of carcinoma in 1 of 1 lymph node (0/1).   B. LYMPH NODE, RIGHT, SENTINEL, BIOPSY:  - There is no evidence of carcinoma in 1 of 1 lymph node (0/1).   C. 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   D. LYMPH NODE, RIGHT #2, SENTINEL, BIOPSY:  - There is no evidence of carcinoma in 1 of 1 lymph node (0/1).   E. LYMPH NODE, RIGHT #3 , SENTINEL, BIOPSY:  - There is no evidence of carcinoma in 1 of 1 lymph node (0/1).   F. LYMPH NODE, RIGHT, SENTINEL, BIOPSY:  - There is no evidence of carcinoma in 1 of 1 lymph node (0/1).   G. LYMPH NODE, RIGHT, SENTINEL, BIOPSY:  -There is no evidence of carcinoma in 1 of 1 lymph node (0/1).   H. 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  - Ductal carcinoma in situ, intermediate grade.  - Perineural invasion is identified.  - The surgical resection margins are negative for carcinoma.  - See oncology table below   I. BREAST, RIGHT ADDITIONAL INFERIOR MARGIN, EXCISION:  - Fibrocystic changes.  - There is no evidence of malignancy.     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 Postoperative findings in the right breast and axilla. Upper normal sized right axillary lymph nodes, nonspecific. 2. 2 mm superior apical segment right upper lobe nodule is technically nonspecific although statistically likely to be benign. 3. 3.6 cm hypodense right adnexal structure, probably a cyst but technically nonspecific. If patient is premenopausal, no follow up is warranted; if early postmenopausal, follow up ultrasound in 6-12 months would be recommended. 4. Other imaging findings of potential clinical significance: Small type 1 hiatal hernia. Descending and sigmoid colon diverticulosis. Small indirect left inguinal hernia contains adipose tissue.   Aortic Atherosclerosis (ICD10-I70.0).   10/26/2019 Imaging   Whole Body Bone scan 10/26/19  IMPRESSION: Nonspecific sites of uptake at the RIGHT ischium and anterior  LEFT sixth rib, cannot exclude metastatic foci.   Otherwise negative exam.     10/29/2019 - 04/04/2020 Chemotherapy   Adjuvant AC q2weeks for 4 cycles starting 10/29/19-12/21/19 followed by weekly Taxol for 12 weeks starting 01/11/20. Taxol reduced 30% starting with C6. Completed on 04/04/20   03/24/2020 Breast MRI   IMPRESSION: 1. Postsurgical changes in the right breast and right axilla. 2. No MRI evidence of malignancy in either breast.   RECOMMENDATION: Diagnostic  bilateral mammogram in September 2021.   BI-RADS CATEGORY  2: Benign.   04/21/2020 - 06/18/2020 Radiation Therapy   adjuvant Radiation in [facility] 04/21/20-06/18/20 with Marguerita Merles      CURRENT THERAPY:  PENDING Antiestrogen therapy   INTERVAL HISTORY:  Paula Davidson is here for a follow up. She presents to the clinic alone. She notes she feels great. She notes she tolerated Radiation and has skin irritation. She noes her prior skin breakdown is healing. She notes she is regaining her energy. She notes residual numbness in her toes. This continues to improve. She denies anymore falls. She denies neuropathy in her fingers. She notes recent dysphagia during Radiation, this is improving.  She notes she still has not had period in years due to IUD and chemo. She is not interested in BSO.    REVIEW OF SYSTEMS:   Constitutional: Denies fevers, chills or abnormal weight loss (+) Improved energy  Eyes: Denies blurriness of vision Ears, nose, mouth, throat, and face: Denies mucositis or sore throat (+) Improving Dysphasia  Respiratory: Denies cough, dyspnea or wheezes Cardiovascular: Denies palpitation, chest discomfort or lower extremity swelling Gastrointestinal:  Denies nausea, heartburn or change in bowel habits Skin: Denies abnormal skin rashes (+) Improved skin breakdown below right breast Lymphatics: Denies new lymphadenopathy or easy bruising Neurological: (+) Residual neuropathy in toes, improving  Behavioral/Psych: Mood is stable, no new changes  All other systems were reviewed with the patient and are negative.  MEDICAL HISTORY:  Past Medical History:  Diagnosis Date   Anxiety    Cancer ([place]) 09/24/2019   right breast cancer-had lumpectomy   Depression    DUB (dysfunctional uterine bleeding)    Family history of pancreatic cancer    Hypertension     SURGICAL HISTORY: Past Surgical History:  Procedure Laterality Date   BREAST LUMPECTOMY WITH AXILLARY LYMPH NODE  BIOPSY Right 09/24/2019   Procedure: RIGHT BREAST LUMPECTOMY WITH SENTINEL LYMPH NODE BIOPSY;  Surgeon: Jovita Kussmaul, MD;  Location: [place];  Service: General;  Laterality: Right;   BREAST SURGERY  09/24/2019   Breast lump excised   FOOT SURGERY     LAPAROSCOPIC GASTRIC BANDING     PORTACATH PLACEMENT N/A 10/25/2019   Procedure: INSERTION PORT-A-CATH WITH POSSIBLE ULTRASOUND;  Surgeon: Jovita Kussmaul, MD;  Location: [facility];  Service: General;  Laterality: N/A;   WISDOM TOOTH EXTRACTION      I have reviewed the social history and family history with the patient and they are unchanged from previous note.  ALLERGIES:  is allergic to hydrochlorothiazide, codeine, and lisinopril.  MEDICATIONS:  Current Outpatient Medications  Medication Sig Dispense Refill   amLODipine (NORVASC) 10 MG tablet Take 10 mg by mouth daily.     Bioflavonoid Products (ESTER C PO) Take 1 tablet by mouth daily.     Calcium Carbonate-Vitamin D (CALCIUM + D PO) Take 1 tablet by mouth daily.      cetirizine (ZYRTEC) 10 MG tablet Take 10 mg by mouth daily as needed for  allergies.     cholecalciferol (VITAMIN D) 25 MCG (1000 UT) tablet Take 1,000 Units by mouth daily.     citalopram (CELEXA) 20 MG tablet Take 20 mg by mouth daily.     gabapentin (NEURONTIN) 100 MG capsule TAKE 1 CAPSULE(100 MG) BY MOUTH AT BEDTIME. INCREASE TO 2 CAPSULE IN A WEEK, AND 3 CAPSULE IN 2 WEEKS, IF SHE TOLERATES WELL 90 capsule 1   LORazepam (ATIVAN) 0.5 MG tablet Take 1-2 tablets (0.5-1 mg total) by mouth every 8 (eight) hours as needed for anxiety (nausea). 20 tablet 0   losartan (COZAAR) 100 MG tablet Take 100 mg by mouth daily.     Multiple Vitamin (MULTIVITAMIN WITH MINERALS) TABS tablet Take 1 tablet by mouth daily.     traZODone (DESYREL) 100 MG tablet Take 1-2 tablets (100-200 mg total) by mouth at bedtime. 60 tablet 2   HYDROcodone-acetaminophen (NORCO/VICODIN) 5-325 MG tablet Take 1-2 tablets by  mouth every 6 (six) hours as needed for moderate pain or severe pain. (Patient not taking: Reported on 02/29/2020) 10 tablet 0   promethazine (PHENERGAN) 25 MG tablet Take 1 tablet (25 mg total) by mouth every 6 (six) hours as needed for nausea or vomiting. (Patient not taking: Reported on 02/29/2020) 30 tablet 0   No current facility-administered medications for this visit.    PHYSICAL EXAMINATION: ECOG PERFORMANCE STATUS: 1 - Symptomatic but completely ambulatory  Vitals:   06/27/20 0959  BP: 126/82  Pulse: 82  Resp: 18  Temp: (!) 96.3 F (35.7 C)  SpO2: 100%   Filed Weights   06/27/20 0959  Weight: 290 lb 3.2 oz (131.6 kg)    GENERAL:alert, no distress and comfortable SKIN: skin color, texture, turgor are normal, no rashes or significant lesions EYES: normal, Conjunctiva are pink and non-injected, sclera clear  NECK: supple, thyroid normal size, non-tender, without nodularity LYMPH:  no palpable lymphadenopathy in the cervical, axillary  LUNGS: clear to auscultation and percussion with normal breathing effort HEART: regular rate & rhythm and no murmurs and no lower extremity edema ABDOMEN:abdomen soft, non-tender and normal bowel sounds Musculoskeletal:no cyanosis of digits and no clubbing  NEURO: alert & oriented x 3 with fluent speech, no focal motor/sensory deficits BREAST: S/p right lumpectomy: Surgical incision healed well (+) Skin hyperpigmentation of right breast with healing skin under right breast No palpable mass, nodules or adenopathy bilaterally. Breast exam benign.   LABORATORY DATA:  I have reviewed the data as listed CBC Latest Ref Rng & Units 06/27/2020 04/04/2020 03/28/2020  WBC 4.0 - 10.5 K/uL 4.0 5.2 5.3  Hemoglobin 12.0 - 15.0 g/dL 11.3(L) 10.6(L) 10.7(L)  Hematocrit 36 - 46 % 35.8(L) 33.0(L) 33.2(L)  Platelets 150 - 400 K/uL 302 355 391     CMP Latest Ref Rng & Units 06/27/2020 04/04/2020 03/28/2020  Glucose 70 - 99 mg/dL 81 118(H) 96  BUN 6 - 20 mg/dL  8 6 8   Creatinine 0.44 - 1.00 mg/dL 0.80 0.77 0.74  Sodium 135 - 145 mmol/L 140 142 139  Potassium 3.5 - 5.1 mmol/L 3.6 3.8 4.0  Chloride 98 - 111 mmol/L 106 107 106  CO2 22 - 32 mmol/L 24 25 24   Calcium 8.9 - 10.3 mg/dL 10.4(H) 9.8 9.7  Total Protein 6.5 - 8.1 g/dL 7.5 7.1 7.3  Total Bilirubin 0.3 - 1.2 mg/dL 0.4 0.2(L) 0.3  Alkaline Phos 38 - 126 U/L 68 52 50  AST 15 - 41 U/L 17 19 18   ALT 0 - 44 U/L 21  27 26      RADIOGRAPHIC STUDIES: I have personally reviewed the radiological images as listed and agreed with the findings in the report. No results found.   ASSESSMENT & PLAN:  Marquitta Persichetti is a 49 y.o. 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  -She underwent right lumpectomy and SLNB with Dr. TMarlou Starkson 09/24/19.Her mammaprint showedHigh Risk Luminal Type B with 29% risk of recurrence in 10 years. -To reduce her high risk adjuvant chemoshe completed adjuvant AC-T chemo on 04/04/20.   -To reduce her risk of local recurrence, [facility]. She tolerated well.  -She is recovering from prior treatment well. She has residual numbness in toes from chemo and residual dysphagia from RT. Side effects are improving and will likely resolve with time. Her energy is returning.  -Given her strong ER and PR expression, I recommend adjuvant antiestrogen therapy. I discussed option of pre and perimenopausal Tamoxifen and Postmenopausal AI with Letrozole. I discussed AI is slightly stronger than Tamoxifen. I would recommend AI for this reason if she is  postmenpopausal. She has not had period for years due to IUD and chemo. I will check hormone level for better evaluation. I discussed option of Ovarian suppression with injections or BSO in order to take AI. She declined for now. She is fine to start with Tamoxifen if indicated.   -The potential benefit and side effects, which includes but not limited to, hot flash, skin and vaginal dryness, metabolic changes ( increased blood glucose, cholesterol, weight, etc.), slightly in increased risk of cardiovascular disease, cataracts, muscular and joint discomfort, osteopenia and osteoporosis, etc, were discussed with her in great details.  I also discussed small risk of thrombosis and endometrial cancer from tamoxifen.  She is interested. I will call in medication based on her hormonal level. I gave her print out of medications.  -Will obtain baseline DEXA when she is postmenopausal.  -She is fine to have PAC removed. She will F/u with Dr TMarlou Starkssoon.  -She will also proceed with breast cancer surveillance. Her  03/2020 MRI was normal. Next Mammogram in 07/2020.  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  -S/p chemo her neuropathy is improving. Currently mild and residual in toes.  -She is on Gabapentin 1038m 4 tabs nightly. I recommend she start weaning down if tolerable. She can reduce to 3 tabs nightly to start.    3. Genetic Testingnegative for pathogenetic mutations -She does haveVUS of BRCA1 c.2048A>G  4. Menorrhagia  -Shehassevere menorrhagia with long term bleeding. Shepreviouslyrequired IV iron.Her Gyn started Mirena 5 years ago before breast cancer diagnosis which stopped her periods.  -She had Mirena removed in 08/2019. Her period has not returned yet.She remains to not have period, during or after chemo.  -She has h/o of ovarian cyst. She currently has one of right ovary as seen on  10/26/19 CT scan. Will f/u with Gyn.   5. HTN, Depression, Anxiety, obesity -On Amlodipine, Losartan, Lopressorand Celexa -She notes [place].  6. Anemia  -Has been secondary to menorrhagia in the past, now secondary to chemo  -Remains mild. Hg at 11.3 today (06/27/20).   7. Insomnia -She had trouble sleeping prior to her cancer diagnosis. She previously tried Melatonin and Amitriptyline with little help.   -She is on Ativan as needed and she gets better sleep with Trazodone.  -She has been counseled on drug interaction with Celexa and Trazodone and knows not to take it at the same time given small drug interaction.   PLAN: -Labs with Hyndman and estradiol levels today, based on the results, I will call you tamoxifen or letrozole to be started 3 to 3 weeks -Mammogram in 07/2020  -Proceed with PAC removal with Dr Marlou Starks  -Survivorship clinic with NP Lacie in 3 months  -Lab and f/u in 6 months    No problem-specific Assessment & Plan notes found for this encounter.   Orders Placed This Encounter  Procedures   MM DIAG BREAST TOMO BILATERAL    Standing Status:   Future    Standing Expiration Date:   06/27/2021    Order Specific Question:   Reason for Exam (SYMPTOM  OR DIAGNOSIS REQUIRED)    Answer:   screening    Order Specific Question:   Is the patient pregnant?    Answer:   No    Order Specific Question:   Preferred imaging location?    Answer:   [facility]   FSH-Follicle stimulating hormone    Standing Status:   Future    Number of Occurrences:   1    Standing Expiration Date:   06/27/2021   Estradiol    Standing Status:   Future    Number of Occurrences:   1    Standing Expiration Date:   06/27/2021   All questions were answered. The patient knows to call the clinic with any problems, questions or concerns. No barriers to learning was detected. The total time spent in the appointment was 30 minutes.     Truitt Merle,  MD 06/27/2020   I, Joslyn Devon, am acting as scribe for Truitt Merle, MD.   I have reviewed the above documentation for accuracy and completeness, and I agree with the above.

## 2020-06-27 ENCOUNTER — Inpatient Hospital Stay: Payer: BLUE CROSS/BLUE SHIELD

## 2020-06-27 ENCOUNTER — Other Ambulatory Visit: Payer: Self-pay

## 2020-06-27 ENCOUNTER — Encounter: Payer: Self-pay | Admitting: Hematology

## 2020-06-27 ENCOUNTER — Inpatient Hospital Stay: Payer: BLUE CROSS/BLUE SHIELD | Attending: Hematology | Admitting: Hematology

## 2020-06-27 VITALS — BP 126/82 | HR 82 | Temp 96.3°F | Resp 18 | Ht 66.0 in | Wt 290.2 lb

## 2020-06-27 DIAGNOSIS — F418 Other specified anxiety disorders: Secondary | ICD-10-CM | POA: Insufficient documentation

## 2020-06-27 DIAGNOSIS — Z170 Estrogen receptor positive status [ER+]: Secondary | ICD-10-CM

## 2020-06-27 DIAGNOSIS — Z79899 Other long term (current) drug therapy: Secondary | ICD-10-CM | POA: Insufficient documentation

## 2020-06-27 DIAGNOSIS — C3411 Malignant neoplasm of upper lobe, right bronchus or lung: Secondary | ICD-10-CM | POA: Diagnosis not present

## 2020-06-27 DIAGNOSIS — C50411 Malignant neoplasm of upper-outer quadrant of right female breast: Secondary | ICD-10-CM

## 2020-06-27 DIAGNOSIS — I10 Essential (primary) hypertension: Secondary | ICD-10-CM | POA: Diagnosis not present

## 2020-06-27 DIAGNOSIS — G4700 Insomnia, unspecified: Secondary | ICD-10-CM | POA: Diagnosis not present

## 2020-06-27 DIAGNOSIS — R1310 Dysphagia, unspecified: Secondary | ICD-10-CM | POA: Insufficient documentation

## 2020-06-27 DIAGNOSIS — N920 Excessive and frequent menstruation with regular cycle: Secondary | ICD-10-CM | POA: Insufficient documentation

## 2020-06-27 DIAGNOSIS — I700 Atherosclerosis of aorta: Secondary | ICD-10-CM | POA: Diagnosis not present

## 2020-06-27 DIAGNOSIS — Z9221 Personal history of antineoplastic chemotherapy: Secondary | ICD-10-CM | POA: Diagnosis not present

## 2020-06-27 DIAGNOSIS — D6481 Anemia due to antineoplastic chemotherapy: Secondary | ICD-10-CM | POA: Diagnosis not present

## 2020-06-27 DIAGNOSIS — Z923 Personal history of irradiation: Secondary | ICD-10-CM | POA: Diagnosis not present

## 2020-06-27 DIAGNOSIS — K449 Diaphragmatic hernia without obstruction or gangrene: Secondary | ICD-10-CM | POA: Insufficient documentation

## 2020-06-27 DIAGNOSIS — Z8719 Personal history of other diseases of the digestive system: Secondary | ICD-10-CM | POA: Insufficient documentation

## 2020-06-27 DIAGNOSIS — D649 Anemia, unspecified: Secondary | ICD-10-CM | POA: Diagnosis not present

## 2020-06-27 DIAGNOSIS — Z95828 Presence of other vascular implants and grafts: Secondary | ICD-10-CM

## 2020-06-27 DIAGNOSIS — E669 Obesity, unspecified: Secondary | ICD-10-CM | POA: Diagnosis not present

## 2020-06-27 LAB — CBC WITH DIFFERENTIAL (CANCER CENTER ONLY)
Abs Immature Granulocytes: 0 10*3/uL (ref 0.00–0.07)
Basophils Absolute: 0 10*3/uL (ref 0.0–0.1)
Basophils Relative: 1 %
Eosinophils Absolute: 0.2 10*3/uL (ref 0.0–0.5)
Eosinophils Relative: 4 %
HCT: 35.8 % — ABNORMAL LOW (ref 36.0–46.0)
Hemoglobin: 11.3 g/dL — ABNORMAL LOW (ref 12.0–15.0)
Immature Granulocytes: 0 %
Lymphocytes Relative: 19 %
Lymphs Abs: 0.8 10*3/uL (ref 0.7–4.0)
MCH: 25.7 pg — ABNORMAL LOW (ref 26.0–34.0)
MCHC: 31.6 g/dL (ref 30.0–36.0)
MCV: 81.5 fL (ref 80.0–100.0)
Monocytes Absolute: 0.5 10*3/uL (ref 0.1–1.0)
Monocytes Relative: 12 %
Neutro Abs: 2.5 10*3/uL (ref 1.7–7.7)
Neutrophils Relative %: 64 %
Platelet Count: 302 10*3/uL (ref 150–400)
RBC: 4.39 MIL/uL (ref 3.87–5.11)
RDW: 15.6 % — ABNORMAL HIGH (ref 11.5–15.5)
WBC Count: 4 10*3/uL (ref 4.0–10.5)
nRBC: 0 % (ref 0.0–0.2)

## 2020-06-27 LAB — CMP (CANCER CENTER ONLY)
ALT: 21 U/L (ref 0–44)
AST: 17 U/L (ref 15–41)
Albumin: 3.7 g/dL (ref 3.5–5.0)
Alkaline Phosphatase: 68 U/L (ref 38–126)
Anion gap: 10 (ref 5–15)
BUN: 8 mg/dL (ref 6–20)
CO2: 24 mmol/L (ref 22–32)
Calcium: 10.4 mg/dL — ABNORMAL HIGH (ref 8.9–10.3)
Chloride: 106 mmol/L (ref 98–111)
Creatinine: 0.8 mg/dL (ref 0.44–1.00)
GFR, Est AFR Am: >60 mL/min (ref >60)
GFR, Estimated: >60 mL/min (ref >60)
Glucose, Bld: 81 mg/dL (ref 70–99)
Potassium: 3.6 mmol/L (ref 3.5–5.1)
Sodium: 140 mmol/L (ref 135–145)
Total Bilirubin: 0.4 mg/dL (ref 0.3–1.2)
Total Protein: 7.5 g/dL (ref 6.5–8.1)

## 2020-06-27 MED ORDER — HEPARIN SOD (PORK) LOCK FLUSH 100 UNIT/ML IV SOLN
500.0000 [IU] | Freq: Once | INTRAVENOUS | Status: DC
  Filled 2020-06-27: qty 5

## 2020-06-27 MED ORDER — SODIUM CHLORIDE 0.9% FLUSH
10.0000 mL | Freq: Once | INTRAVENOUS | Status: DC
  Filled 2020-06-27: qty 10

## 2020-06-27 NOTE — Patient Instructions (Signed)

## 2020-06-28 ENCOUNTER — Encounter: Payer: Self-pay | Admitting: Hematology

## 2020-06-28 LAB — FOLLICLE STIMULATING HORMONE: FSH: 39 m[IU]/mL

## 2020-06-28 LAB — ESTRADIOL: Estradiol: 9.4 pg/mL

## 2020-06-29 ENCOUNTER — Other Ambulatory Visit: Payer: Self-pay | Admitting: Hematology

## 2020-06-29 DIAGNOSIS — C50411 Malignant neoplasm of upper-outer quadrant of right female breast: Secondary | ICD-10-CM

## 2020-06-29 MED ORDER — LETROZOLE 2.5 MG PO TABS
2.5000 mg | ORAL_TABLET | Freq: Every day | ORAL | 3 refills | Status: DC
Start: 2020-06-29 — End: 2020-12-23

## 2020-07-02 ENCOUNTER — Other Ambulatory Visit: Payer: Self-pay | Admitting: Hematology

## 2020-07-02 DIAGNOSIS — C50411 Malignant neoplasm of upper-outer quadrant of right female breast: Secondary | ICD-10-CM

## 2020-07-25 ENCOUNTER — Ambulatory Visit
Admission: RE | Admit: 2020-07-25 | Discharge: 2020-07-25 | Disposition: A | Discharge status: Home / Self Care | Payer: BLUE CROSS/BLUE SHIELD | Source: Ambulatory Visit | Attending: Hematology | Admitting: Hematology

## 2020-07-25 ENCOUNTER — Other Ambulatory Visit: Payer: Self-pay

## 2020-07-25 DIAGNOSIS — C50411 Malignant neoplasm of upper-outer quadrant of right female breast: Secondary | ICD-10-CM

## 2020-07-25 HISTORY — DX: Personal history of irradiation: Z92.3

## 2020-07-25 HISTORY — DX: Personal history of antineoplastic chemotherapy: Z92.21

## 2020-08-07 IMAGING — MR MR BREAST BILAT WO/W CM
8 of 12 series · 29 of 48 positions shown · IV contrast (gadavist)
Comparison: Previous exam(s).

CLINICAL DATA: History of right breast cancer in August 2019
status post lumpectomy, sentinel biopsy, and chemotherapy. Benign
right axillary biopsy. Patient also had a benign biopsy in September 2019 demonstrating fibroadenoma of the upper-outer right breast.

LABS:  None.
EXAM:
BILATERAL BREAST MRI WITH AND WITHOUT CONTRAST
TECHNIQUE: Multiplanar, multisequence MR images of both breasts were obtained
prior to and following the intravenous administration of 10 ml of
Gadavist

[Series 7: t2_tirm_tra ipat (a-p) · axial · 3.0mm · 0.78mm/px · 1 of 75 slices shown]
[im 1/75]
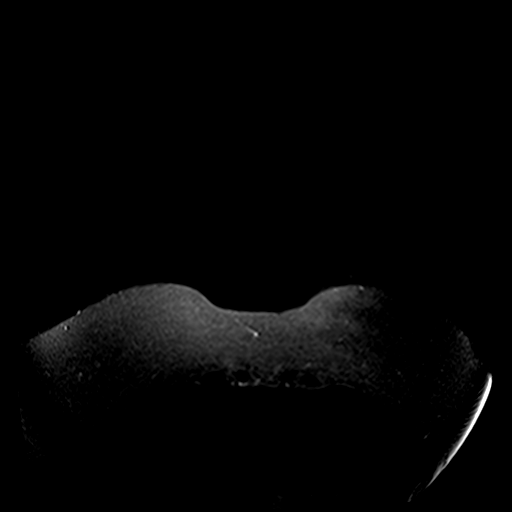

[Series 8: fl3d pre-cm no · axial · non-contrast · 1.2mm · 1.04mm/px · z∈[-102,+147]mm · 5 of 208 slices shown]
[im 1/208]
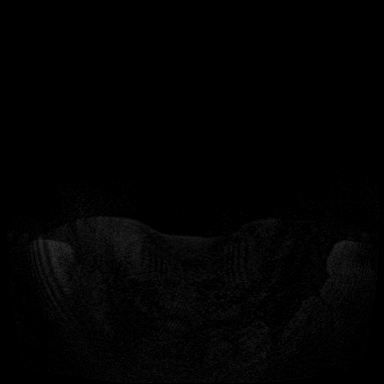
[im 52/208]
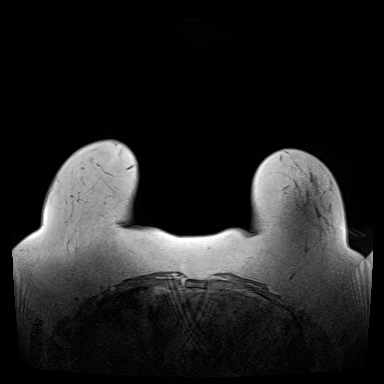
[im 104/208]
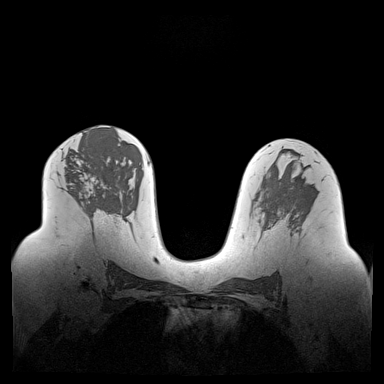
[im 156/208]
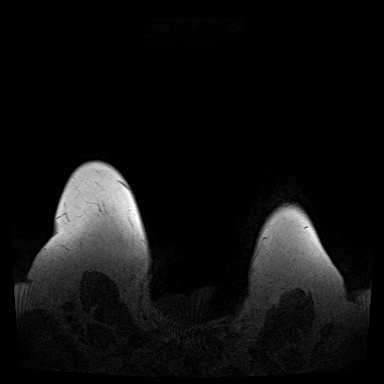
[im 208/208]
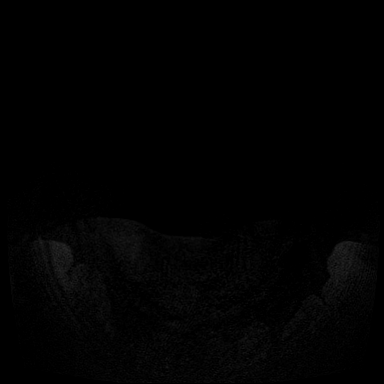

[Series 9: fl3d pre-cm · axial · non-contrast · 1.2mm · 0.96mm/px · z∈[-102,+147]mm · 5 of 208 slices shown]
[im 1/208]
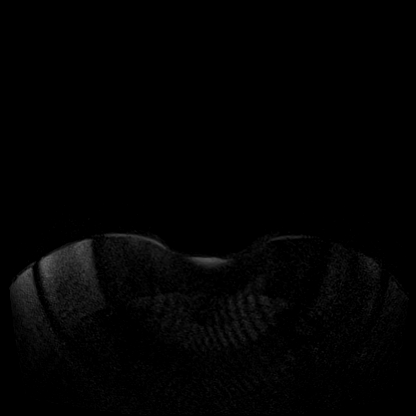
[im 52/208]
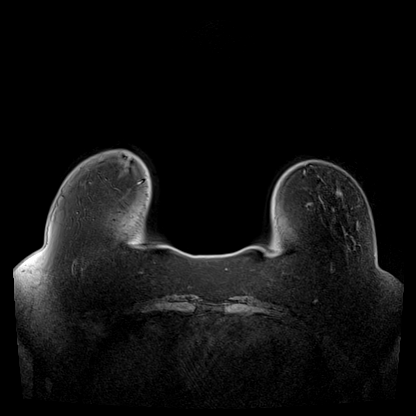
[im 104/208]
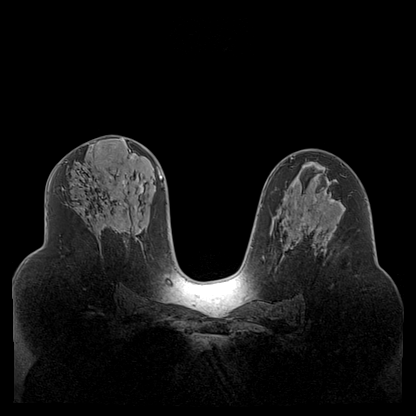
[im 156/208]
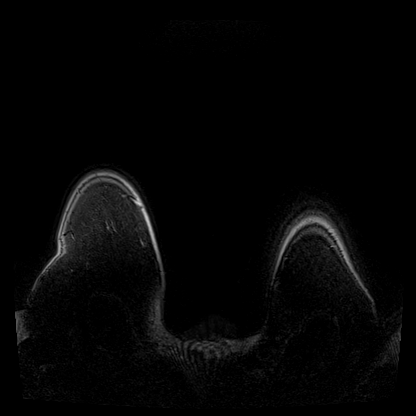
[im 208/208]
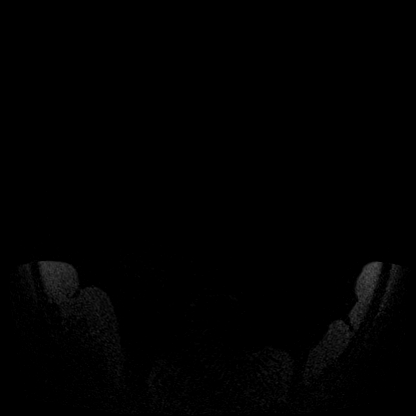

[Series 10: fl3d post-cm 20 · axial · 1.2mm · 0.96mm/px · z∈[-102,+147]mm · 5 of 208 slices shown (1 of 3)]
[im 1/208]
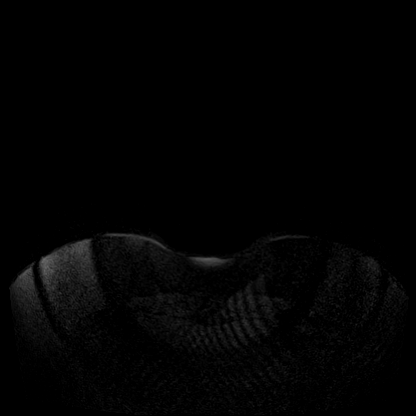
[im 52/208]
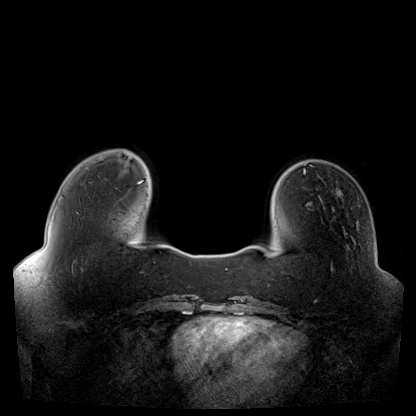
[im 104/208]
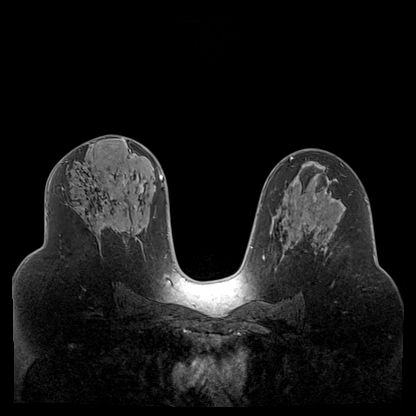
[im 156/208]
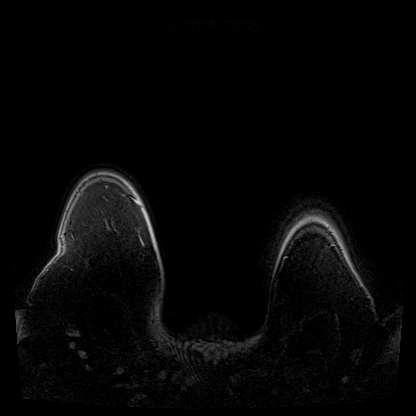
[im 208/208]
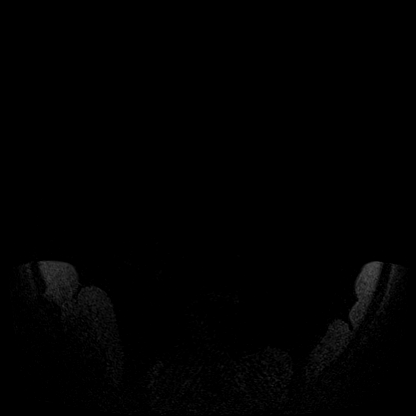

[Series 11: fl3d post-cm 20 · axial · 1.2mm · 0.96mm/px · z∈[-102,+147]mm · 5 of 208 slices shown (2 of 3)]
[im 1/208]
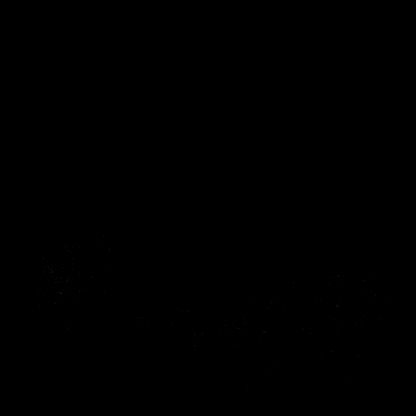
[im 52/208]
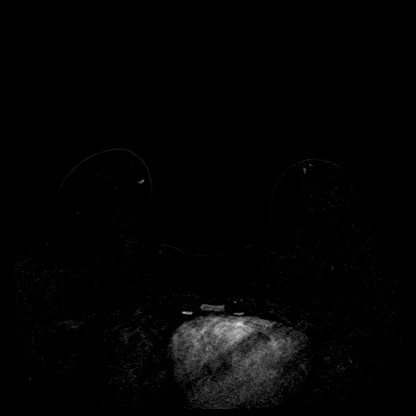
[im 104/208]
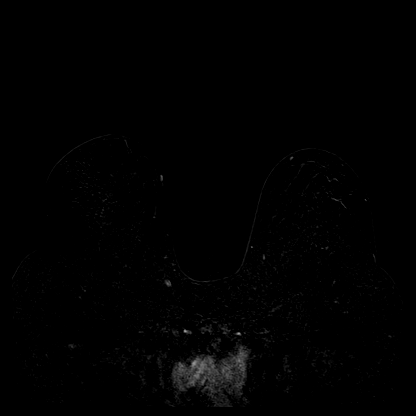
[im 156/208]
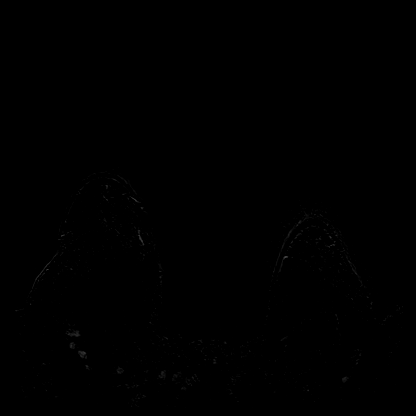
[im 208/208]
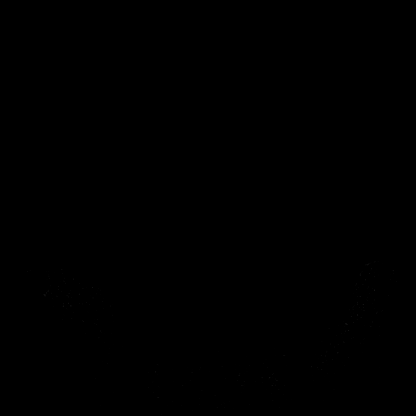

[Series 12: fl3d post-cm 20 · axial · 249.6mm · 0.96mm/px · 1 of 1 slices shown (3 of 3)]
[im 1/1]
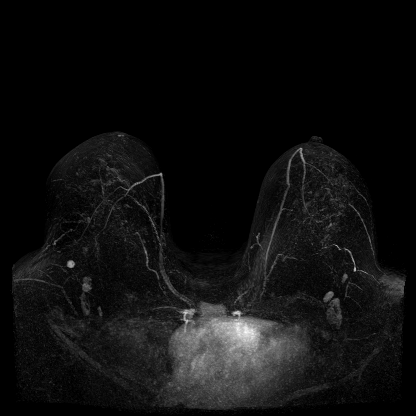

[Series 13: fl3d post-cm 3min · axial · 1.2mm · 0.96mm/px · z∈[-102,+147]mm · 6 of 208 slices shown]
[im 1/208]
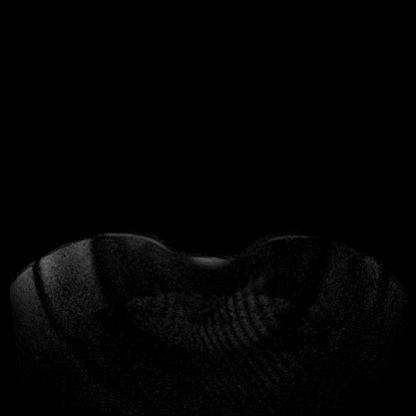
[im 42/208]
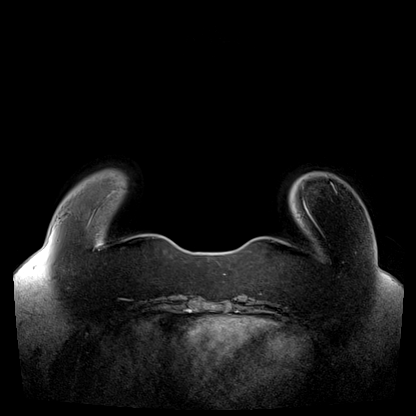
[im 83/208]
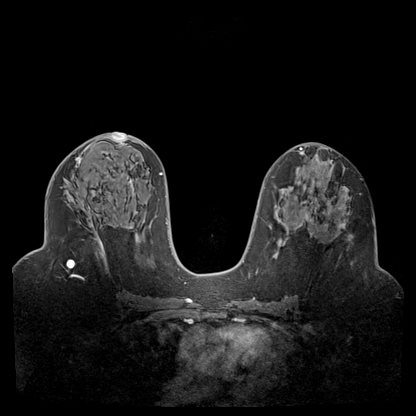
[im 125/208]
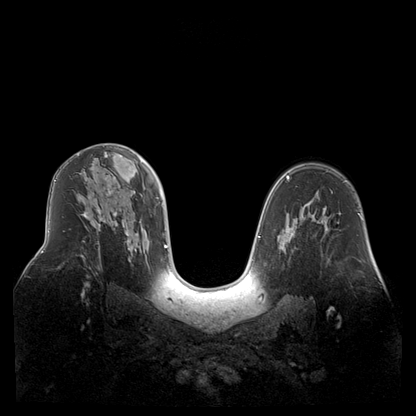
[im 166/208]
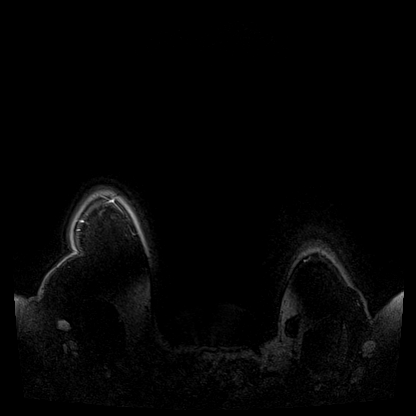
[im 208/208]
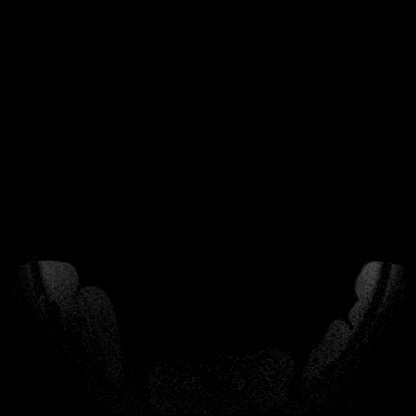

[Series 14: fl3d post-cm 3min_sub · axial · 1.2mm · 0.96mm/px · 1 of 208 slices shown]
[im 1/208]
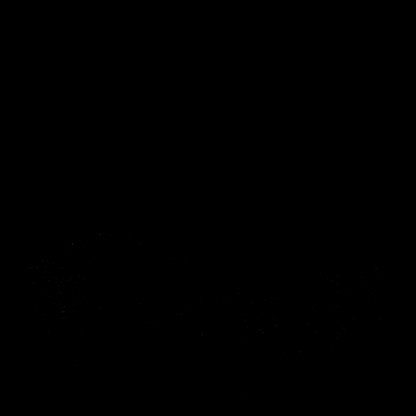

[29 of 48 positions shown; findings below may reference images not displayed]

Three-dimensional MR images were rendered by post-processing of the
original MR data on an independent workstation. The
three-dimensional MR images were interpreted, and findings are
reported in the following complete MRI report for this study. Three
dimensional images were evaluated at the independent [employer]
workstation
FINDINGS: Breast composition: d. Extreme fibroglandular tissue.

Background parenchymal enhancement: Moderate.

Right breast: There is susceptibility artifact within the right
axilla as well in the outer right breast consistent with
postsurgical changes following lumpectomy and sentinel node biopsy.

There is also a focus of susceptibility artifact in the upper outer
right breast associated both small mass consistent with the benign
biopsy proven fibroadenoma. There is no new suspicious enhancing
mass or non mass enhancement in the right breast.

Left breast: No mass or abnormal enhancement.

Lymph nodes: No abnormal appearing lymph nodes.

Ancillary findings:  None.
IMPRESSION: 1. Postsurgical changes in the right breast and right axilla.
2. No MRI evidence of malignancy in either breast.

RECOMMENDATION:
Diagnostic bilateral mammogram in July 2020.

BI-RADS CATEGORY  2: Benign.

## 2020-08-22 ENCOUNTER — Other Ambulatory Visit: Payer: Self-pay | Admitting: Hematology

## 2020-09-15 ENCOUNTER — Encounter: Payer: Self-pay | Admitting: Hematology

## 2020-09-17 ENCOUNTER — Other Ambulatory Visit: Payer: Self-pay | Admitting: Hematology

## 2020-09-17 ENCOUNTER — Telehealth: Payer: Self-pay

## 2020-09-17 NOTE — Telephone Encounter (Signed)
I spoke with Ms Paula Davidson she has intermittent edema and mild pain in her right breast.  She just want to let us know.  She has appt with rad onc at [facility] tomorrow.  This is were she had rad therapy.  She has an appt with Paula Rue, NP next week.  [name] did not want an appointment sooner.

## 2020-09-18 ENCOUNTER — Other Ambulatory Visit: Payer: Self-pay | Admitting: Hematology

## 2020-09-18 MED ORDER — TRAZODONE HCL 100 MG PO TABS: 100.0000 mg | ORAL_TABLET | Freq: Every day | ORAL | 2 refills | Status: DC

## 2020-09-25 ENCOUNTER — Inpatient Hospital Stay: Payer: BLUE CROSS/BLUE SHIELD | Admitting: Nurse Practitioner

## 2020-09-25 ENCOUNTER — Inpatient Hospital Stay: Payer: BLUE CROSS/BLUE SHIELD | Attending: Hematology

## 2020-12-08 IMAGING — MG DIGITAL DIAGNOSTIC BILAT W/ TOMO W/ CAD
6 of 9 series · 6 of 25 positions shown · non-contrast
Comparison: Previous exam(s).

CLINICAL DATA: Patient underwent a right lumpectomy for breast
carcinoma performed in Monday September, 2019, with adjuvant radiation and
chemotherapy. She also has a more remote benign excision of the
right breast with the scar at 12 o'clock. Current exam is for
routine annual surveillance.

EXAM:
DIGITAL DIAGNOSTIC BILATERAL MAMMOGRAM WITH TOMO AND CAD

[R CC]
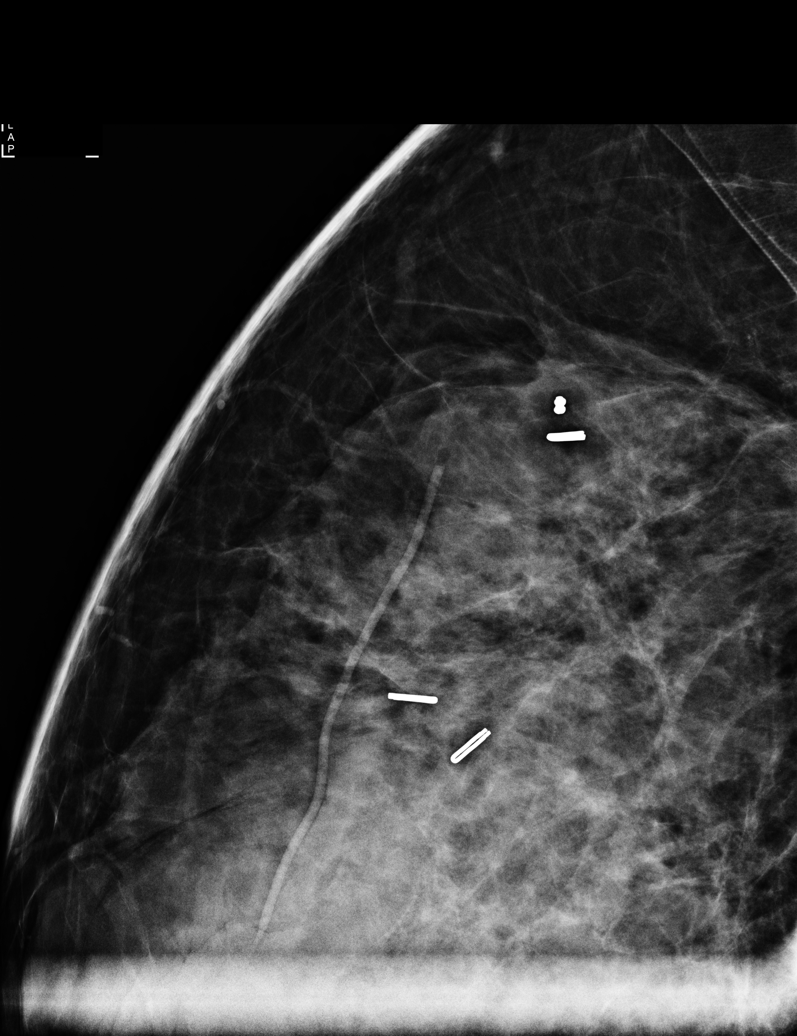

[R MLO synth-2D]
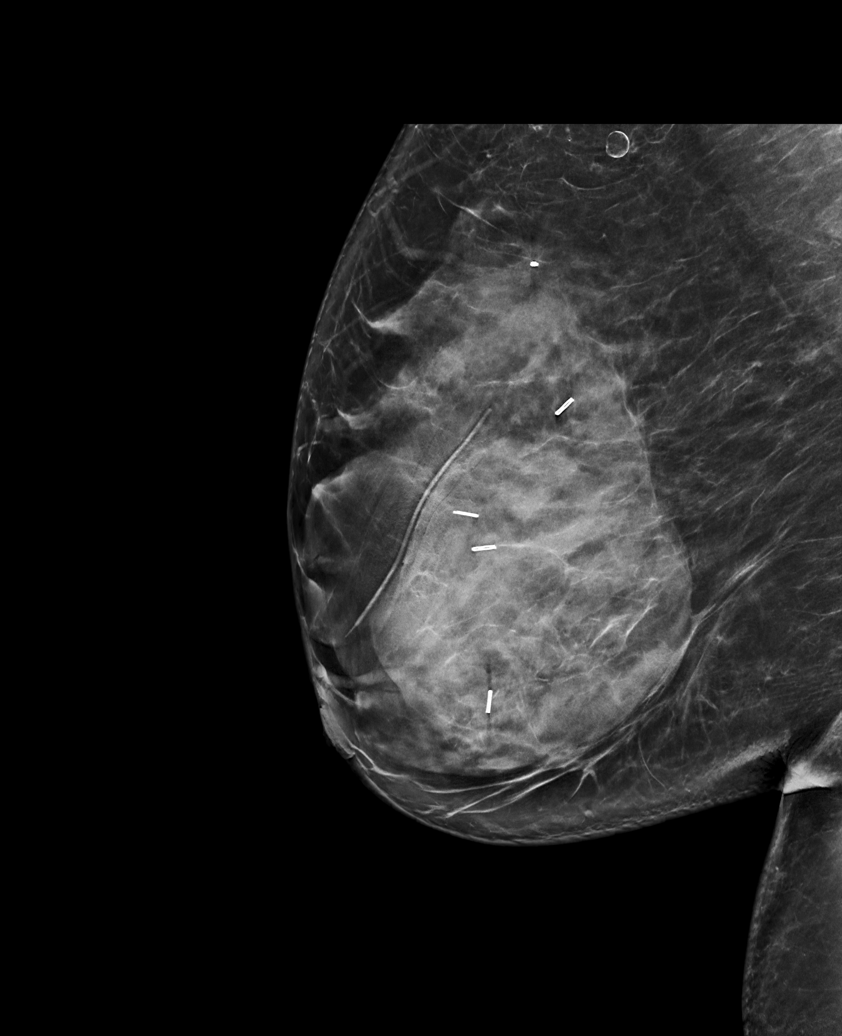

[R CC synth-2D]
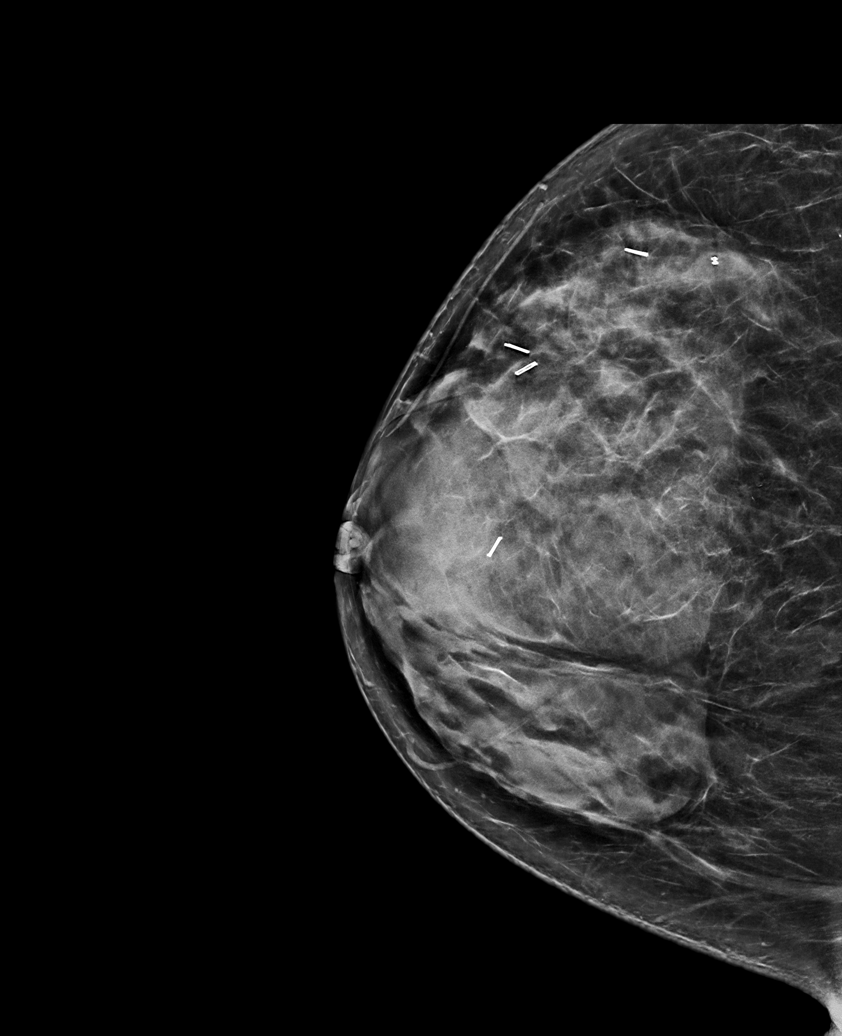

[L MLO synth-2D]
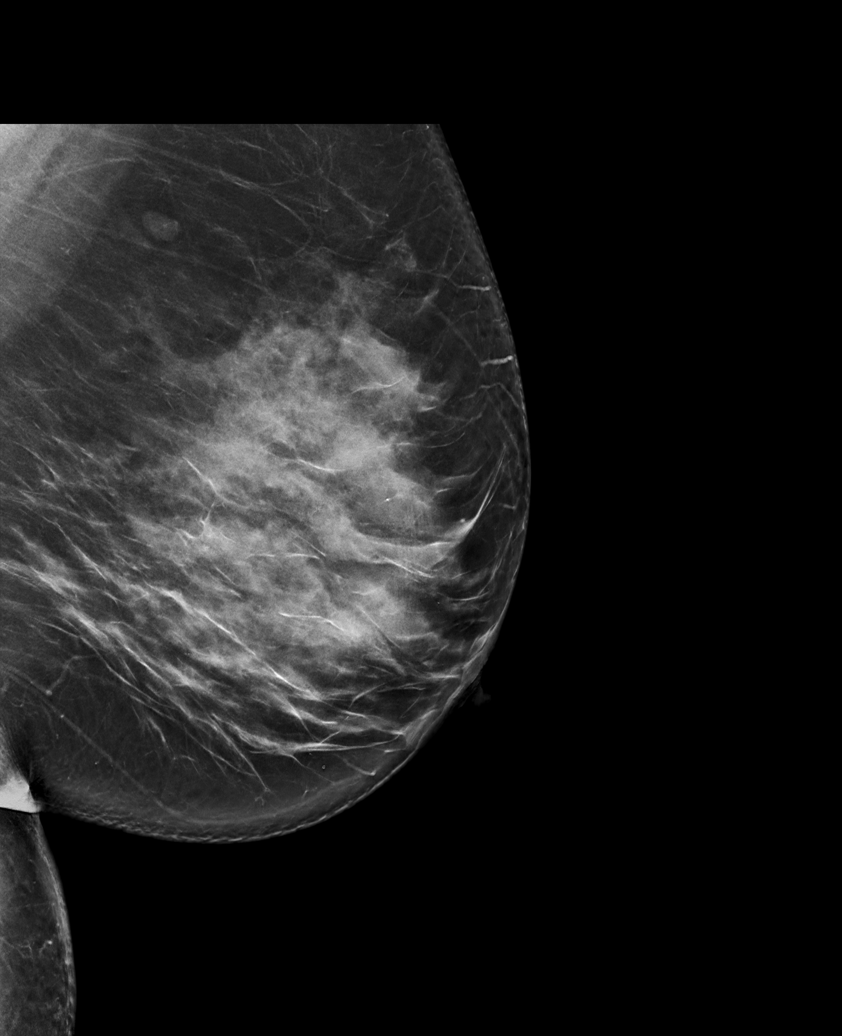

[L CC synth-2D]
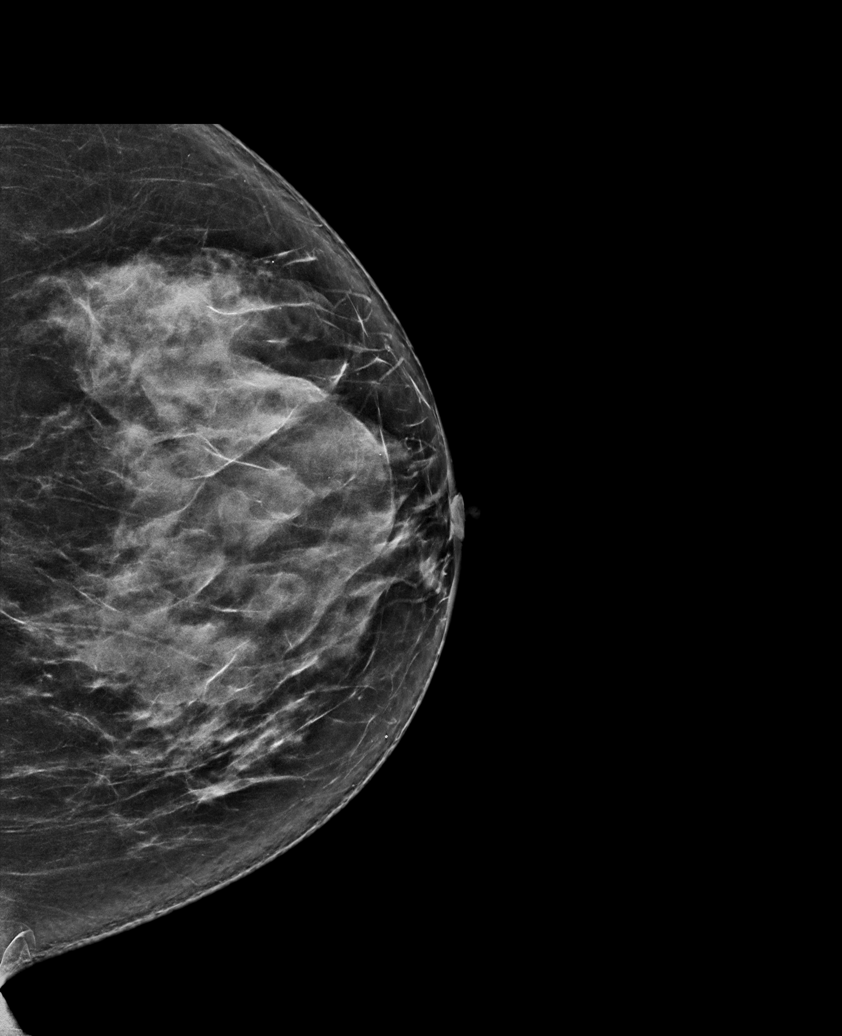

[R CC tomo · tomo slice 42/83.0]
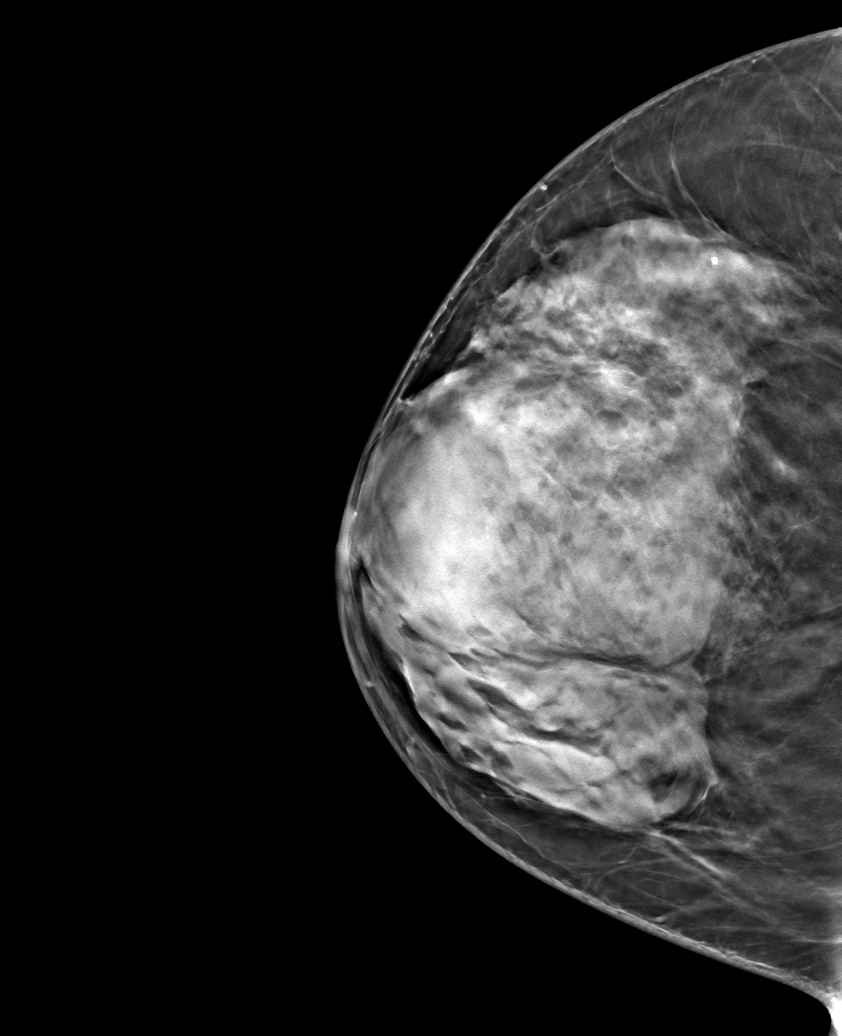

[6 of 25 positions shown; findings below may reference images not displayed]

ACR Breast Density Category d: The breast tissue is extremely dense,
which lowers the sensitivity of mammography.
FINDINGS: Postsurgical architectural distortion with associated surgical
vascular clips lie in the lateral right breast and right axilla.

There are no suspicious masses, areas of nonsurgical architectural
distortion or suspicious calcifications.

Mammographic images were processed with CAD.
IMPRESSION: 1. No evidence of new or recurrent breast carcinoma.
2. Benign postsurgical changes on the right.

RECOMMENDATION:
1. Diagnostic mammogram is suggested in 1 year per standard post
lumpectomy protocol. (Code:[id number]-[id number])
2. Recommend annual breast MRI given the patient's elevated risk for
developing additional breast carcinoma and given her extremely dense
breast architecture mammographically.

I have discussed the findings and recommendations with the patient.
If applicable, a reminder letter will be sent to the patient
regarding the next appointment.

BI-RADS CATEGORY  2: Benign.

## 2020-12-17 ENCOUNTER — Ambulatory Visit: Payer: Self-pay | Admitting: General Surgery

## 2020-12-20 ENCOUNTER — Other Ambulatory Visit: Payer: Self-pay | Admitting: Hematology

## 2021-01-02 ENCOUNTER — Inpatient Hospital Stay: Payer: BLUE CROSS/BLUE SHIELD

## 2021-01-02 ENCOUNTER — Inpatient Hospital Stay: Payer: BLUE CROSS/BLUE SHIELD | Admitting: Hematology

## 2021-01-07 NOTE — Progress Notes (Incomplete)
[place]   [phone] Fax:([phone]   Clinic Follow up Note   Patient Care Team: Burnett Sheng, MD as PCP - General (Family Medicine) Mauro Kaufmann, RN as Oncology Nurse Navigator Rockwell Germany, RN as Oncology Nurse Navigator Jovita Kussmaul, MD as Consulting Physician (General Surgery) Truitt Merle, MD as Consulting Physician (Hematology) Eppie Gibson, MD as Attending Physician (Radiation Oncology) Leanna Sato, MD as Referring Physician Alla Feeling, NP as Nurse Practitioner (Nurse Practitioner)  Date of Service:  01/07/2021  CHIEF COMPLAINT: F/u of right breast cancer  SUMMARY OF ONCOLOGIC HISTORY: Oncology History Overview Note  Cancer Staging Malignant neoplasm of upper-outer quadrant of right breast in female, estrogen receptor positive ([place]) Staging form: Breast, AJCC 8th Edition - Clinical stage from 08/28/2019: Stage IB (cT2, cN0, cM0, G2, ER+, PR+, HER2-) - Signed by Truitt Merle, MD on 09/04/2019 - Pathologic stage from 09/24/2019: Stage IB (pT2, pN1a, cM0, G2, ER+, PR+, HER2-) - Signed by Truitt Merle, MD on 10/10/2019    Malignant neoplasm of upper-outer quadrant of right breast in female, estrogen receptor positive ([place])  08/24/2019 Mammogram   Diagnostic mammogram and [place] 08/24/19 IMPRESSION: Suspicious right breast mass 10 o'clock 1 cm from the nipple measuring 3.3 x 1.9 x 1.9 cm and axillary adenopathy.   08/28/2019 Initial Biopsy   Diagnosis 08/28/19 1. Breast, right, needle core biopsy, 10 o'clock - INVASIVE MAMMARY CARCINOMA, GRADE II. - SEE MICROSCOPIC DESCRIPTION. 2. Lymph node, needle/core biopsy, right axilla - BENIGN LYMPH NODE. - NO METASTATIC CARCINOMA IDENTIFIED.    08/28/2019 Receptors her2   Results: IMMUNOHISTOCHEMICAL AND MORPHOMETRIC ANALYSIS PERFORMED MANUALLY The tumor cells are NEGATIVE for Her2 (1+). Estrogen Receptor: 100%, POSITIVE, STRONG STAINING INTENSITY Progesterone Receptor:  100%, POSITIVE, STRONG STAINING INTENSITY Proliferation Marker Ki67: 10%   08/28/2019 Cancer Staging   Staging form: Breast, AJCC 8th Edition - Clinical stage from 08/28/2019: Stage IB (cT2, cN0, cM0, G2, ER+, PR+, HER2-) - Signed by Truitt Merle, MD on 09/04/2019   08/31/2019 Initial Diagnosis   Malignant neoplasm of upper-outer quadrant of right breast in female, estrogen receptor positive ([place])   09/07/2019 Breast MRI   Breast MRI 09/07/19  IMPRESSION: 1. 2.5 cm known malignancy in the anterior right breast, superficial depth near the nipple. 2. 7-8 mm indeterminate mass in the central upper outer quadrant of the right breast. 3. One right axillary lymph node with apparent mild cortical thickening, which lies slightly anterior and inferior to the biopsied right axillary lymph node ( benign reactive on pathology). This is most likely also benign given the pathology from the adjacent biopsied lymph node. 4. No evidence of left breast malignancy.   09/17/2019 Imaging   [place] right breast 09/17/19  IMPRESSION: Several benign appearing masses in the upper outer quadrant of the right breast without a definite correlate to the finding seen on prior MRI. The right axillary lymph nodes appear similar to the recently biopsied benign lymph node.   09/20/2019 Pathology Results   Diagnosis 09/20/19  Breast, right, needle core biopsy, upper outer quadrant - FIBROADENOMA - NO MALIGNANCY IDENTIFIED    09/21/2019 Genetic Testing   Negative genetic testing:  No pathogenic variants detected on the [facility] Breast Cancer STAT panel and Common Hereditary Cancers panel.  A variant of uncertain significance was identified in the BRCA1 gene, called c.2048A>G ([id number]).  The report date is 09/21/2019.  The STAT Breast cancer panel offered by [facility] includes sequencing and rearrangement analysis for the following  9 genes:  ATM, BRCA1, BRCA2, CDH1, CHEK2, PALB2, PTEN, STK11 and TP53.   The Common  Hereditary Cancers Panel offered by [facility] includes sequencing and/or deletion duplication testing of the following 48 genes: APC, ATM, AXIN2, BARD1, BMPR1A, BRCA1, BRCA2, BRIP1, CDH1, CDK4, CDKN2A (p14ARF), CDKN2A (p16INK4a), CHEK2, CTNNA1, DICER1, EPCAM (Deletion/duplication testing only), GREM1 (promoter region deletion/duplication testing only), KIT, MEN1, MLH1, MSH2, MSH3, MSH6, MUTYH, NBN, NF1, NHTL1, PALB2, PDGFRA, PMS2, POLD1, POLE, PTEN, RAD50, RAD51C, RAD51D, RNF43, SDHB, SDHC, SDHD, SMAD4, SMARCA4. STK11, TP53, TSC1, TSC2, and VHL.  The following genes were evaluated for sequence changes only: SDHA and HOXB13 c.251G>A variant only.    09/24/2019 Surgery   RIGHT BREAST LUMPECTOMY WITH SENTINEL LYMPH NODE BIOPSY by Dr Marlou Starks  09/24/19    09/24/2019 Pathology Results   FINAL MICROSCOPIC DIAGNOSIS: 09/24/19   A. LYMPH NODE, RIGHT #1, SENTINEL, BIOPSY:  - There is no evidence of carcinoma in 1 of 1 lymph node (0/1).   B. LYMPH NODE, RIGHT, SENTINEL, BIOPSY:  - There is no evidence of carcinoma in 1 of 1 lymph node (0/1).   C. 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   D. LYMPH NODE, RIGHT #2, SENTINEL, BIOPSY:  - There is no evidence of carcinoma in 1 of 1 lymph node (0/1).   E. LYMPH NODE, RIGHT #3 , SENTINEL, BIOPSY:  - There is no evidence of carcinoma in 1 of 1 lymph node (0/1).   F. LYMPH NODE, RIGHT, SENTINEL, BIOPSY:  - There is no evidence of carcinoma in 1 of 1 lymph node (0/1).   G. LYMPH NODE, RIGHT, SENTINEL, BIOPSY:  -There is no evidence of carcinoma in 1 of 1 lymph node (0/1).   H. 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  - Ductal carcinoma in situ, intermediate grade.  - Perineural invasion is identified.  - The surgical resection margins are negative for carcinoma.  - See oncology table below   I. BREAST, RIGHT ADDITIONAL INFERIOR MARGIN, EXCISION:  - Fibrocystic  changes.  - There is no evidence of malignancy.     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 Postoperative findings in the right breast and axilla. Upper normal sized right axillary lymph nodes, nonspecific. 2. 2 mm superior apical segment right upper lobe nodule is technically nonspecific although statistically likely to be benign. 3. 3.6 cm hypodense right adnexal structure, probably a cyst but technically nonspecific. If patient is premenopausal, no follow up is warranted; if early postmenopausal, follow up ultrasound in 6-12 months would be recommended. 4. Other imaging findings of potential clinical significance: Small type 1 hiatal hernia. Descending and sigmoid colon diverticulosis. Small indirect left inguinal hernia contains adipose tissue.   Aortic Atherosclerosis (ICD10-I70.0).   10/26/2019 Imaging  Whole Body Bone scan 10/26/19  IMPRESSION: Nonspecific sites of uptake at the RIGHT ischium and anterior LEFT sixth rib, cannot exclude metastatic foci.   Otherwise negative exam.     10/29/2019 - 04/04/2020 Chemotherapy   Adjuvant AC q2weeks for 4 cycles starting 10/29/19-12/21/19 followed by weekly Taxol for 12 weeks starting 01/11/20. Taxol reduced 30% starting with C6. Completed on 04/04/20   03/24/2020 Breast MRI   IMPRESSION: 1. Postsurgical changes in the  right breast and right axilla. 2. No MRI evidence of malignancy in either breast.   RECOMMENDATION: Diagnostic bilateral mammogram in September 2021.   BI-RADS CATEGORY  2: Benign.   04/21/2020 - 06/18/2020 Radiation Therapy   adjuvant Radiation in [facility] 04/21/20-06/18/20 with Marguerita Merles      CURRENT THERAPY:  Letrozole 2.$RemoveBefo'5mg'CRXkFriLRMN$  started in ***  INTERVAL HISTORY: *** Paula Davidson is here for a follow up of right breast cancer. She presents to the clinic alone.    REVIEW OF SYSTEMS:  *** Constitutional: Denies fevers, chills or abnormal weight loss Eyes: Denies blurriness of vision Ears, nose, mouth, throat, and face: Denies mucositis or sore throat Respiratory: Denies cough, dyspnea or wheezes Cardiovascular: Denies palpitation, chest discomfort or lower extremity swelling Gastrointestinal:  Denies nausea, heartburn or change in bowel habits Skin: Denies abnormal skin rashes Lymphatics: Denies new lymphadenopathy or easy bruising Neurological:Denies numbness, tingling or new weaknesses Behavioral/Psych: Mood is stable, no new changes  All other systems were reviewed with the patient and are negative.  MEDICAL HISTORY:  Past Medical History:  Diagnosis Date  . Anxiety   . Cancer ([place]) 09/24/2019   right breast cancer-had lumpectomy  . Depression   . DUB (dysfunctional uterine bleeding)   . Family history of pancreatic cancer   . Hypertension   . Personal history of chemotherapy   . Personal history of radiation therapy     SURGICAL HISTORY: Past Surgical History:  Procedure Laterality Date  . BREAST LUMPECTOMY    . BREAST LUMPECTOMY WITH AXILLARY LYMPH NODE BIOPSY Right 09/24/2019   Procedure: RIGHT BREAST LUMPECTOMY WITH SENTINEL LYMPH NODE BIOPSY;  Surgeon: Jovita Kussmaul, MD;  Location: [facility];  Service: General;  Laterality: Right;  . BREAST SURGERY  09/24/2019   Breast lump excised  . FOOT SURGERY    . LAPAROSCOPIC GASTRIC BANDING    .  PORTACATH PLACEMENT N/A 10/25/2019   Procedure: INSERTION PORT-A-CATH WITH POSSIBLE ULTRASOUND;  Surgeon: Jovita Kussmaul, MD;  Location: [facility];  Service: General;  Laterality: N/A;  . WISDOM TOOTH EXTRACTION      I have reviewed the social history and family history with the patient and they are unchanged from previous note.  ALLERGIES:  is allergic to hydrochlorothiazide, codeine, and lisinopril.  MEDICATIONS:  Current Outpatient Medications  Medication Sig Dispense Refill  . amLODipine (NORVASC) 10 MG tablet Take 10 mg by mouth daily.    Marland Kitchen Bioflavonoid Products (ESTER C PO) Take 1 tablet by mouth daily.    . Calcium Carbonate-Vitamin D (CALCIUM + D PO) Take 1 tablet by mouth daily.     . cetirizine (ZYRTEC) 10 MG tablet Take 10 mg by mouth daily as needed for allergies.    . cholecalciferol (VITAMIN D) 25 MCG (1000 UT) tablet Take 1,000 Units by mouth daily.    . citalopram (CELEXA) 20 MG tablet Take 20 mg by mouth daily.    Marland Kitchen gabapentin (NEURONTIN) 100 MG capsule TAKE 1 CAPSULE(100 MG) BY MOUTH AT BEDTIME. INCREASE TO 2 CAPSULES  IN A WEEK, AND 3 CAPSULES IN 2 WEEKS, IF SHE TOLERATES WELL 90 capsule 1  . HYDROcodone-acetaminophen (NORCO/VICODIN) 5-325 MG tablet Take 1-2 tablets by mouth every 6 (six) hours as needed for moderate pain or severe pain. (Patient not taking: Reported on 02/29/2020) 10 tablet 0  . letrozole (FEMARA) 2.5 MG tablet TAKE 1 TABLET(2.5 MG) BY MOUTH DAILY 30 tablet 3  . LORazepam (ATIVAN) 0.5 MG tablet Take 1-2 tablets (0.5-1 mg total) by mouth every 8 (eight) hours as needed for anxiety (nausea). 20 tablet 0  . losartan (COZAAR) 100 MG tablet Take 100 mg by mouth daily.    . Multiple Vitamin (MULTIVITAMIN WITH MINERALS) TABS tablet Take 1 tablet by mouth daily.    . promethazine (PHENERGAN) 25 MG tablet Take 1 tablet (25 mg total) by mouth every 6 (six) hours as needed for nausea or vomiting. (Patient not taking: Reported on 02/29/2020) 30 tablet 0  . traZODone  (DESYREL) 100 MG tablet Take 1-2 tablets (100-200 mg total) by mouth at bedtime. 60 tablet 2   No current facility-administered medications for this visit.    PHYSICAL EXAMINATION: ECOG PERFORMANCE STATUS: {CHL ONC ECOG PS:[id number]}  There were no vitals filed for this visit. There were no vitals filed for this visit. *** GENERAL:alert, no distress and comfortable SKIN: skin color, texture, turgor are normal, no rashes or significant lesions EYES: normal, Conjunctiva are pink and non-injected, sclera clear {OROPHARYNX:no exudate, no erythema and lips, buccal mucosa, and tongue normal}  NECK: supple, thyroid normal size, non-tender, without nodularity LYMPH:  no palpable lymphadenopathy in the cervical, axillary {or inguinal} LUNGS: clear to auscultation and percussion with normal breathing effort HEART: regular rate & rhythm and no murmurs and no lower extremity edema ABDOMEN:abdomen soft, non-tender and normal bowel sounds Musculoskeletal:no cyanosis of digits and no clubbing  NEURO: alert & oriented x 3 with fluent speech, no focal motor/sensory deficits  LABORATORY DATA:  I have reviewed the data as listed CBC Latest Ref Rng & Units 06/27/2020 04/04/2020 03/28/2020  WBC 4.0 - 10.5 K/uL 4.0 5.2 5.3  Hemoglobin 12.0 - 15.0 g/dL 11.3(L) 10.6(L) 10.7(L)  Hematocrit 36.0 - 46.0 % 35.8(L) 33.0(L) 33.2(L)  Platelets 150 - 400 K/uL 302 355 391     CMP Latest Ref Rng & Units 06/27/2020 04/04/2020 03/28/2020  Glucose 70 - 99 mg/dL 81 118(H) 96  BUN 6 - 20 mg/dL $Remove'8 6 8  'gVisvxs$ Creatinine 0.44 - 1.00 mg/dL 0.80 0.77 0.74  Sodium 135 - 145 mmol/L 140 142 139  Potassium 3.5 - 5.1 mmol/L 3.6 3.8 4.0  Chloride 98 - 111 mmol/L 106 107 106  CO2 22 - 32 mmol/L $RemoveB'24 25 24  'WJtVMQhH$ Calcium 8.9 - 10.3 mg/dL 10.4(H) 9.8 9.7  Total Protein 6.5 - 8.1 g/dL 7.5 7.1 7.3  Total Bilirubin 0.3 - 1.2 mg/dL 0.4 0.2(L) 0.3  Alkaline Phos 38 - 126 U/L 68 52 50  AST 15 - 41 U/L $Remo'17 19 18  'iSBEX$ ALT 0 - 44 U/L $Remo'21 27 26       'XlsXC$ RADIOGRAPHIC STUDIES: I have personally reviewed the radiological images as listed and agreed with the findings in the report. No results found.   ASSESSMENT & PLAN:  Paula Davidson is a 50 y.o. female with   1Malignant neoplasm of upper-outer quadrant of right breast,pT2N1aM0,stage IB,ER+/PR+, HER2-, [place] -She was diagnosed in 08/2019. Shehas had a right breast mass for 7-8 years that later grew into grade II invasive mammary carcinoma of right breast and lymph node  negative. -She underwent right lumpectomy and SLNB with Dr. Marlou Starks on 09/24/19.Her mammaprint showedHigh Risk Luminal Type B with 29% risk of recurrence in 10 years.She was treated with adjuvant chemo AC-T chemo on 04/04/20 and adjuvant Radiation.  -She started antiestrogen therapy with Letrozole in ***. Will obtain baseline DEXA *** -She will also proceed with breast cancer surveillance. Her  03/2020 MRI was normal. Next Mammogram in 07/2020.    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  -S/p chemo her neuropathy is improving. Currently mild and residual in toes.  -She is on Gabapentin $RemoveBefor'100mg'AejGnsXdChbD$ , 4 tabs nightly. I recommend she start weaning down if tolerable. She can reduce to 3 tabs nightly to start.   3. Genetic Testingnegative for pathogenetic mutations -She does haveVUS of BRCA1 c.2048A>G  4. Menorrhagia  -Shehassevere menorrhagia with long term bleeding. Shepreviouslyrequired IV iron.Her Gyn started Mirena 5 years ago before breast cancer diagnosis which stopped her periods.  -She had Mirena removed in 08/2019. Her period has not returned yet.She remains to not have period, during or after chemo.  -She has h/o of ovarian cyst. She currently has one of right ovary as seen on 10/26/19 CT scan. Will f/u with Gyn.   5. HTN, Depression, Anxiety, obesity -On Amlodipine, Losartan, Lopressorand Celexa -She notes she lives with Emanuel.  6. Anemia  -Has been secondary  to menorrhagia in the past, now secondary to chemo ***   7. Insomnia -She had trouble sleeping prior to her cancer diagnosis. She previously tried Melatonin and Amitriptyline with little help.   -She is on Ativan as needed and she gets better sleep with Trazodone.    PLAN: ***   No problem-specific Assessment & Plan notes found for this encounter.   No orders of the defined types were placed in this encounter.  All questions were answered. The patient knows to call the clinic with any problems, questions or concerns. No barriers to learning was detected. The total time spent in the appointment was {CHL ONC TIME VISIT - VAPOL:[id number]}.     Joslyn Devon 01/07/2021   Oneal Deputy, am acting as scribe for Truitt Merle, MD.   {Add scribe attestation statement}

## 2021-01-08 ENCOUNTER — Inpatient Hospital Stay: Payer: BLUE CROSS/BLUE SHIELD | Attending: Hematology

## 2021-01-08 ENCOUNTER — Inpatient Hospital Stay: Payer: BLUE CROSS/BLUE SHIELD | Admitting: Hematology

## 2021-01-14 ENCOUNTER — Telehealth: Payer: Self-pay | Admitting: Hematology

## 2021-01-14 NOTE — Telephone Encounter (Signed)
Scheduled appointments per 03/01 schedule message. Attempted to contact patient, left a detailed message about appointment.

## 2021-01-17 ENCOUNTER — Other Ambulatory Visit: Payer: Self-pay | Admitting: Hematology

## 2021-01-19 ENCOUNTER — Other Ambulatory Visit: Payer: Self-pay | Admitting: Hematology

## 2021-01-19 MED ORDER — TRAZODONE HCL 100 MG PO TABS: 100.0000 mg | ORAL_TABLET | Freq: Every day | ORAL | 0 refills | Status: DC

## 2021-01-30 NOTE — Progress Notes (Incomplete)
Paula Davidson   [phone] [phone]   Clinic Follow up Note   Patient Care Team: Burnett Sheng, MD as PCP - General (Family Medicine) Mauro Kaufmann, RN as Oncology Nurse Navigator Rockwell Germany, RN as Oncology Nurse Navigator Jovita Kussmaul, MD as Consulting Physician (General Surgery) Truitt Merle, MD as Consulting Physician (Hematology) Eppie Gibson, MD as Attending Physician (Radiation Oncology) Leanna Sato, MD as Referring Physician Alla Feeling, NP as Nurse Practitioner (Nurse Practitioner)  Date of Service:  01/30/2021  CHIEF COMPLAINT: F/u of right breast cancer  SUMMARY OF ONCOLOGIC HISTORY: Oncology History Overview Note  Cancer Staging Malignant neoplasm of upper-outer quadrant of right breast in female, estrogen receptor positive ([place]) Staging form: Breast, AJCC 8th Edition - Clinical stage from 08/28/2019: Stage IB (cT2, cN0, cM0, G2, ER+, PR+, HER2-) - Signed by Truitt Merle, MD on 09/04/2019 - Pathologic stage from 09/24/2019: Stage IB (pT2, pN1a, cM0, G2, ER+, PR+, HER2-) - Signed by Truitt Merle, MD on 10/10/2019    Malignant neoplasm of upper-outer quadrant of right breast in female, estrogen receptor positive ([place])  08/24/2019 Mammogram   Diagnostic mammogram and [place] 08/24/19 IMPRESSION: Suspicious right breast mass 10 o'clock 1 cm from the nipple measuring 3.3 x 1.9 x 1.9 cm and axillary adenopathy.   08/28/2019 Initial Biopsy   Diagnosis 08/28/19 1. Breast, right, needle core biopsy, 10 o'clock - INVASIVE MAMMARY CARCINOMA, GRADE II. - SEE MICROSCOPIC DESCRIPTION. 2. Lymph node, needle/core biopsy, right axilla - BENIGN LYMPH NODE. - NO METASTATIC CARCINOMA IDENTIFIED.    08/28/2019 Receptors her2   Results: IMMUNOHISTOCHEMICAL AND MORPHOMETRIC ANALYSIS PERFORMED MANUALLY The tumor cells are NEGATIVE for Her2 (1+). Estrogen Receptor: 100%, POSITIVE, STRONG STAINING INTENSITY Progesterone Receptor:  100%, POSITIVE, STRONG STAINING INTENSITY Proliferation Marker Ki67: 10%   08/28/2019 Cancer Staging   Staging form: Breast, AJCC 8th Edition - Clinical stage from 08/28/2019: Stage IB (cT2, cN0, cM0, G2, ER+, PR+, HER2-) - Signed by Truitt Merle, MD on 09/04/2019   08/31/2019 Initial Diagnosis   Malignant neoplasm of upper-outer quadrant of right breast in female, estrogen receptor positive ([place])   09/07/2019 Breast MRI   Breast MRI 09/07/19  IMPRESSION: 1. 2.5 cm known malignancy in the anterior right breast, superficial depth near the nipple. 2. 7-8 mm indeterminate mass in the central upper outer quadrant of the right breast. 3. One right axillary lymph node with apparent mild cortical thickening, which lies slightly anterior and inferior to the biopsied right axillary lymph node ( benign reactive on pathology). This is most likely also benign given the pathology from the adjacent biopsied lymph node. 4. No evidence of left breast malignancy.   09/17/2019 Imaging   [place] right breast 09/17/19  IMPRESSION: Several benign appearing masses in the upper outer quadrant of the right breast without a definite correlate to the finding seen on prior MRI. The right axillary lymph nodes appear similar to the recently biopsied benign lymph node.   09/20/2019 Pathology Results   Diagnosis 09/20/19  Breast, right, needle core biopsy, upper outer quadrant - FIBROADENOMA - NO MALIGNANCY IDENTIFIED    09/21/2019 Genetic Testing   Negative genetic testing:  No pathogenic variants detected on the [facility] Breast Cancer STAT panel and Common Hereditary Cancers panel.  A variant of uncertain significance was identified in the BRCA1 gene, called c.2048A>G ([id number]).  The report date is 09/21/2019.  The STAT Breast cancer panel offered by [facility] includes sequencing and rearrangement analysis for the following  9 genes:  ATM, BRCA1, BRCA2, CDH1, CHEK2, PALB2, PTEN, STK11 and TP53.   The Common  Hereditary Cancers Panel offered by [facility] includes sequencing and/or deletion duplication testing of the following 48 genes: APC, ATM, AXIN2, BARD1, BMPR1A, BRCA1, BRCA2, BRIP1, CDH1, CDK4, CDKN2A (p14ARF), CDKN2A (p16INK4a), CHEK2, CTNNA1, DICER1, EPCAM (Deletion/duplication testing only), GREM1 (promoter region deletion/duplication testing only), KIT, MEN1, MLH1, MSH2, MSH3, MSH6, MUTYH, NBN, NF1, NHTL1, PALB2, PDGFRA, PMS2, POLD1, POLE, PTEN, RAD50, RAD51C, RAD51D, RNF43, SDHB, SDHC, SDHD, SMAD4, SMARCA4. STK11, TP53, TSC1, TSC2, and VHL.  The following genes were evaluated for sequence changes only: SDHA and HOXB13 c.251G>A variant only.    09/24/2019 Surgery   RIGHT BREAST LUMPECTOMY WITH SENTINEL LYMPH NODE BIOPSY by Dr Marlou Starks  09/24/19    09/24/2019 Pathology Results   FINAL MICROSCOPIC DIAGNOSIS: 09/24/19   A. LYMPH NODE, RIGHT #1, SENTINEL, BIOPSY:  - There is no evidence of carcinoma in 1 of 1 lymph node (0/1).   B. LYMPH NODE, RIGHT, SENTINEL, BIOPSY:  - There is no evidence of carcinoma in 1 of 1 lymph node (0/1).   C. 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   D. LYMPH NODE, RIGHT #2, SENTINEL, BIOPSY:  - There is no evidence of carcinoma in 1 of 1 lymph node (0/1).   E. LYMPH NODE, RIGHT #3 , SENTINEL, BIOPSY:  - There is no evidence of carcinoma in 1 of 1 lymph node (0/1).   F. LYMPH NODE, RIGHT, SENTINEL, BIOPSY:  - There is no evidence of carcinoma in 1 of 1 lymph node (0/1).   G. LYMPH NODE, RIGHT, SENTINEL, BIOPSY:  -There is no evidence of carcinoma in 1 of 1 lymph node (0/1).   H. 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  - Ductal carcinoma in situ, intermediate grade.  - Perineural invasion is identified.  - The surgical resection margins are negative for carcinoma.  - See oncology table below   I. BREAST, RIGHT ADDITIONAL INFERIOR MARGIN, EXCISION:  - Fibrocystic  changes.  - There is no evidence of malignancy.     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 Postoperative findings in the right breast and axilla. Upper normal sized right axillary lymph nodes, nonspecific. 2. 2 mm superior apical segment right upper lobe nodule is technically nonspecific although statistically likely to be benign. 3. 3.6 cm hypodense right adnexal structure, probably a cyst but technically nonspecific. If patient is premenopausal, no follow up is warranted; if early postmenopausal, follow up ultrasound in 6-12 months would be recommended. 4. Other imaging findings of potential clinical significance: Small type 1 hiatal hernia. Descending and sigmoid colon diverticulosis. Small indirect left inguinal hernia contains adipose tissue.   Aortic Atherosclerosis (ICD10-I70.0).   10/26/2019 Imaging  Whole Body Bone scan 10/26/19  IMPRESSION: Nonspecific sites of uptake at the RIGHT ischium and anterior LEFT sixth rib, cannot exclude metastatic foci.   Otherwise negative exam.     10/29/2019 - 04/04/2020 Chemotherapy   Adjuvant AC q2weeks for 4 cycles starting 10/29/19-12/21/19 followed by weekly Taxol for 12 weeks starting 01/11/20. Taxol reduced 30% starting with C6. Completed on 04/04/20   03/24/2020 Breast MRI   IMPRESSION: 1. Postsurgical changes in the  right breast and right axilla. 2. No MRI evidence of malignancy in either breast.   RECOMMENDATION: Diagnostic bilateral mammogram in September 2021.   BI-RADS CATEGORY  2: Benign.   04/21/2020 - 06/18/2020 Radiation Therapy   adjuvant Radiation in [facility] 04/21/20-06/18/20 with Marguerita Merles      CURRENT THERAPY:  Letrozole 2.5cm starting****  INTERVAL HISTORY: *** Paula Davidson is here for a follow up. She was last seen by me 7 months and seen by NP Lacie 4 months ago. She was last seen by me She presents to the clinic alone.    REVIEW OF SYSTEMS:  *** Constitutional: Denies fevers, chills or abnormal weight loss Eyes: Denies blurriness of vision Ears, nose, mouth, throat, and face: Denies mucositis or sore throat Respiratory: Denies cough, dyspnea or wheezes Cardiovascular: Denies palpitation, chest discomfort or lower extremity swelling Gastrointestinal:  Denies nausea, heartburn or change in bowel habits Skin: Denies abnormal skin rashes Lymphatics: Denies new lymphadenopathy or easy bruising Neurological:Denies numbness, tingling or new weaknesses Behavioral/Psych: Mood is stable, no new changes  All other systems were reviewed with the patient and are negative.  MEDICAL HISTORY:  Past Medical History:  Diagnosis Date  . Anxiety   . Cancer ([facility] [place]) 09/24/2019   right breast cancer-had lumpectomy  . Depression   . DUB (dysfunctional uterine bleeding)   . Family history of pancreatic cancer   . Hypertension   . Personal history of chemotherapy   . Personal history of radiation therapy     SURGICAL HISTORY: Past Surgical History:  Procedure Laterality Date  . BREAST LUMPECTOMY    . BREAST LUMPECTOMY WITH AXILLARY LYMPH NODE BIOPSY Right 09/24/2019   Procedure: RIGHT BREAST LUMPECTOMY WITH SENTINEL LYMPH NODE BIOPSY;  Surgeon: Jovita Kussmaul, MD;  Location: [facility];  Service: General;  Laterality: Right;  . BREAST SURGERY  09/24/2019   Breast lump  excised  . FOOT SURGERY    . LAPAROSCOPIC GASTRIC BANDING    . PORTACATH PLACEMENT N/A 10/25/2019   Procedure: INSERTION PORT-A-CATH WITH POSSIBLE ULTRASOUND;  Surgeon: Jovita Kussmaul, MD;  Location: [facility];  Service: General;  Laterality: N/A;  . WISDOM TOOTH EXTRACTION      I have reviewed the social history and family history with the patient and they are unchanged from previous note.  ALLERGIES:  is allergic to hydrochlorothiazide, codeine, and lisinopril.  MEDICATIONS:  Current Outpatient Medications  Medication Sig Dispense Refill  . amLODipine (NORVASC) 10 MG tablet Take 10 mg by mouth daily.    Marland Kitchen Bioflavonoid Products (ESTER C PO) Take 1 tablet by mouth daily.    . Calcium Carbonate-Vitamin D (CALCIUM + D PO) Take 1 tablet by mouth daily.     . cetirizine (ZYRTEC) 10 MG tablet Take 10 mg by mouth daily as needed for allergies.    . cholecalciferol (VITAMIN D) 25 MCG (1000 UT) tablet Take 1,000 Units by mouth daily.    . citalopram (CELEXA) 20 MG tablet Take 20 mg by mouth daily.    Marland Kitchen gabapentin (  NEURONTIN) 100 MG capsule TAKE 1 CAPSULE(100 MG) BY MOUTH AT BEDTIME. INCREASE TO 2 CAPSULES IN A WEEK, AND 3 CAPSULES IN 2 WEEKS, IF SHE TOLERATES WELL 90 capsule 1  . HYDROcodone-acetaminophen (NORCO/VICODIN) 5-325 MG tablet Take 1-2 tablets by mouth every 6 (six) hours as needed for moderate pain or severe pain. (Patient not taking: Reported on 02/29/2020) 10 tablet 0  . letrozole (FEMARA) 2.5 MG tablet TAKE 1 TABLET(2.5 MG) BY MOUTH DAILY 30 tablet 3  . LORazepam (ATIVAN) 0.5 MG tablet Take 1-2 tablets (0.5-1 mg total) by mouth every 8 (eight) hours as needed for anxiety (nausea). 20 tablet 0  . losartan (COZAAR) 100 MG tablet Take 100 mg by mouth daily.    . Multiple Vitamin (MULTIVITAMIN WITH MINERALS) TABS tablet Take 1 tablet by mouth daily.    . promethazine (PHENERGAN) 25 MG tablet Take 1 tablet (25 mg total) by mouth every 6 (six) hours as needed for nausea or vomiting. (Patient  not taking: Reported on 02/29/2020) 30 tablet 0  . traZODone (DESYREL) 100 MG tablet Take 1 tablet (100 mg total) by mouth at bedtime. 30 tablet 0   No current facility-administered medications for this visit.    PHYSICAL EXAMINATION: ECOG PERFORMANCE STATUS: {CHL ONC ECOG PS:[phone]}  There were no vitals filed for this visit. There were no vitals filed for this visit. *** GENERAL:alert, no distress and comfortable SKIN: skin color, texture, turgor are normal, no rashes or significant lesions EYES: normal, Conjunctiva are pink and non-injected, sclera clear {OROPHARYNX:no exudate, no erythema and lips, buccal mucosa, and tongue normal}  NECK: supple, thyroid normal size, non-tender, without nodularity LYMPH:  no palpable lymphadenopathy in the cervical, axillary {or inguinal} LUNGS: clear to auscultation and percussion with normal breathing effort HEART: regular rate & rhythm and no murmurs and no lower extremity edema ABDOMEN:abdomen soft, non-tender and normal bowel sounds Musculoskeletal:no cyanosis of digits and no clubbing  NEURO: alert & oriented x 3 with fluent speech, no focal motor/sensory deficits  LABORATORY DATA:  I have reviewed the data as listed CBC Latest Ref Rng & Units 06/27/2020 04/04/2020 03/28/2020  WBC 4.0 - 10.5 K/uL 4.0 5.2 5.3  Hemoglobin 12.0 - 15.0 g/dL 11.3(L) 10.6(L) 10.7(L)  Hematocrit 36.0 - 46.0 % 35.8(L) 33.0(L) 33.2(L)  Platelets 150 - 400 K/uL 302 355 391     CMP Latest Ref Rng & Units 06/27/2020 04/04/2020 03/28/2020  Glucose 70 - 99 mg/dL 81 118(H) 96  BUN 6 - 20 mg/dL _0 Creatinine 0.44 - 1.00 mg/dL 0.80 0.77 0.74  Sodium 135 - 145 mmol/L 140 142 139  Potassium 3.5 - 5.1 mmol/L 3.6 3.8 4.0  Chloride 98 - 111 mmol/L 106 107 106  CO2 22 - 32 mmol/L _1 Calcium 8.9 - 10.3 mg/dL 10.4(H) 9.8 9.7  Total Protein 6.5 - 8.1 g/dL 7.5 7.1 7.3  Total Bilirubin 0.3 - 1.2 mg/dL 0.4 0.2(L) 0.3  Alkaline Phos 38 - 126 U/L 68 52 50  AST 15 -  41 U/L _2 ALT 0 - 44 U/L _3 RADIOGRAPHIC STUDIES: I have personally reviewed the radiological images as listed and agreed with the findings in the report. No results found.   ASSESSMENT & PLAN:  Paula Davidson is a 50 y.o. female with     1.Malignant neoplasm of upper-outer quadrant of right breast,pT2N1aM0,stage IB,ER+/PR+, HER2-, GradeII -She was diagnosed in 08/2019. She has had a right breast  mass for 7-8 years that later grew into grade II invasive mammary carcinoma of right breast and lymph node negative. -She underwent right lumpectomy and SLNB with Dr. Marlou Starks on 09/24/19. 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 She was treated with adjuvant chemo AC-T chemo on 04/04/20 and adjuvant Radiation.  -She started antiestrogen therapy with Letrozole in ***. Will obtain baseline DEXA *** -She will also proceed with breast cancer surveillance. Her  03/2020 MRI was normal. Next Mammogram in 07/2020.      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  -S/p chemo her neuropathy is improving. Currently mild and residual in toes.  -She is on Gabapentin 147m, 4 tabs nightly. I recommend she start weaning down if tolerable. She can reduce to 3 tabs nightly to start.    3. Genetic Testing negative for pathogenetic mutations -She does have VUS of BRCA1 c.2048A>G     4. Menorrhagia  -She has severe menorrhagia with long term bleeding. She previously required IV iron. Her Gyn started Mirena 5 years ago before breast cancer diagnosis which stopped her periods.  -She had Mirena removed in 08/2019. Her period has not returned yet. She remains to not have period, during or after chemo.  -She has h/o of ovarian cyst. She currently has one of right ovary as seen on 10/26/19 CT scan. Will f/u with Gyn.     5. HTN, Depression, Anxiety, obesity  -On Amlodipine, Losartan, Lopressor and Celexa -She [place]    6. Anemia  -Has been secondary to menorrhagia in the past, now secondary to chemo ***    7. Insomnia  -She had trouble sleeping prior to her cancer diagnosis. She previously tried Melatonin and Amitriptyline with little help.   -She is on Ativan as needed and she gets better sleep with Trazodone.      PLAN:  ***    No problem-specific Assessment & Plan notes found for this encounter.   No orders of the defined types were placed in this encounter.  All questions were answered. The patient knows to call the clinic with any problems, questions or concerns. No barriers to learning was detected. The total time spent in the appointment was {CHL ONC TIME VISIT - [id number]}     AJoslyn Devon3/18/2022   IOneal Deputy am acting as scribe for YTruitt Merle MD.   {Add scribe attestation statement}

## 2021-02-04 ENCOUNTER — Inpatient Hospital Stay: Payer: BLUE CROSS/BLUE SHIELD | Admitting: Hematology

## 2021-02-04 ENCOUNTER — Inpatient Hospital Stay: Payer: BLUE CROSS/BLUE SHIELD | Attending: Hematology

## 2021-02-19 ENCOUNTER — Telehealth: Payer: Self-pay

## 2021-02-19 NOTE — Telephone Encounter (Signed)
Paula Davidson has been a no show for multiple appts.  I left a vm for her to call me.

## 2021-03-25 ENCOUNTER — Telehealth: Payer: Self-pay | Admitting: Hematology

## 2021-03-25 NOTE — Telephone Encounter (Signed)
Scheduled appts per 5/11 sch msg. Pt aware.

## 2021-03-25 NOTE — Progress Notes (Incomplete)
[place]   [phone] [phone]   Clinic Follow up Note   Patient Care Team: Burnett Sheng, MD as PCP - General (Family Medicine) Mauro Kaufmann, RN as Oncology Nurse Navigator Rockwell Germany, RN as Oncology Nurse Navigator Jovita Kussmaul, MD as Consulting Physician (General Surgery) Truitt Merle, MD as Consulting Physician (Hematology) Eppie Gibson, MD as Attending Physician (Radiation Oncology) Leanna Sato, MD as Referring Physician Alla Feeling, NP as Nurse Practitioner (Nurse Practitioner)  Date of Service:  03/25/2021  CHIEF COMPLAINT: F/u of right breast cancer  SUMMARY OF ONCOLOGIC HISTORY: Oncology History Overview Note  Cancer Staging Malignant neoplasm of upper-outer quadrant of right breast in female, estrogen receptor positive ([employer]) Staging form: Breast, AJCC 8th Edition - Clinical stage from 08/28/2019: Stage IB (cT2, cN0, cM0, G2, ER+, PR+, HER2-) - Signed by Truitt Merle, MD on 09/04/2019 - Pathologic stage from 09/24/2019: Stage IB (pT2, pN1a, cM0, G2, ER+, PR+, HER2-) - Signed by Truitt Merle, MD on 10/10/2019    Malignant neoplasm of upper-outer quadrant of right breast in female, estrogen receptor positive ([place])  08/24/2019 Mammogram   Diagnostic mammogram and [place] 08/24/19 IMPRESSION: Suspicious right breast mass 10 o'clock 1 cm from the nipple measuring 3.3 x 1.9 x 1.9 cm and axillary adenopathy.   08/28/2019 Initial Biopsy   Diagnosis 08/28/19 1. Breast, right, needle core biopsy, 10 o'clock - INVASIVE MAMMARY CARCINOMA, GRADE II. - SEE MICROSCOPIC DESCRIPTION. 2. Lymph node, needle/core biopsy, right axilla - BENIGN LYMPH NODE. - NO METASTATIC CARCINOMA IDENTIFIED.    08/28/2019 Receptors her2   Results: IMMUNOHISTOCHEMICAL AND MORPHOMETRIC ANALYSIS PERFORMED MANUALLY The tumor cells are NEGATIVE for Her2 (1+). Estrogen Receptor: 100%, POSITIVE, STRONG STAINING INTENSITY Progesterone Receptor:  100%, POSITIVE, STRONG STAINING INTENSITY Proliferation Marker Ki67: 10%   08/28/2019 Cancer Staging   Staging form: Breast, AJCC 8th Edition - Clinical stage from 08/28/2019: Stage IB (cT2, cN0, cM0, G2, ER+, PR+, HER2-) - Signed by Truitt Merle, MD on 09/04/2019   08/31/2019 Initial Diagnosis   Malignant neoplasm of upper-outer quadrant of right breast in female, estrogen receptor positive ([place])   09/07/2019 Breast MRI   Breast MRI 09/07/19  IMPRESSION: 1. 2.5 cm known malignancy in the anterior right breast, superficial depth near the nipple. 2. 7-8 mm indeterminate mass in the central upper outer quadrant of the right breast. 3. One right axillary lymph node with apparent mild cortical thickening, which lies slightly anterior and inferior to the biopsied right axillary lymph node ( benign reactive on pathology). This is most likely also benign given the pathology from the adjacent biopsied lymph node. 4. No evidence of left breast malignancy.   09/17/2019 Imaging   [place] right breast 09/17/19  IMPRESSION: Several benign appearing masses in the upper outer quadrant of the right breast without a definite correlate to the finding seen on prior MRI. The right axillary lymph nodes appear similar to the recently biopsied benign lymph node.   09/20/2019 Pathology Results   Diagnosis 09/20/19  Breast, right, needle core biopsy, upper outer quadrant - FIBROADENOMA - NO MALIGNANCY IDENTIFIED    09/21/2019 Genetic Testing   Negative genetic testing:  No pathogenic variants detected on the [facility] Breast Cancer STAT panel and Common Hereditary Cancers panel.  A variant of uncertain significance was identified in the BRCA1 gene, called c.2048A>G ([id number]).  The report date is 09/21/2019.  The STAT Breast cancer panel offered by [facility] includes sequencing and rearrangement analysis for the following  9 genes:  ATM, BRCA1, BRCA2, CDH1, CHEK2, PALB2, PTEN, STK11 and TP53.   The Common  Hereditary Cancers Panel offered by [facility] includes sequencing and/or deletion duplication testing of the following 48 genes: APC, ATM, AXIN2, BARD1, BMPR1A, BRCA1, BRCA2, BRIP1, CDH1, CDK4, CDKN2A (p14ARF), CDKN2A (p16INK4a), CHEK2, CTNNA1, DICER1, EPCAM (Deletion/duplication testing only), GREM1 (promoter region deletion/duplication testing only), KIT, MEN1, MLH1, MSH2, MSH3, MSH6, MUTYH, NBN, NF1, NHTL1, PALB2, PDGFRA, PMS2, POLD1, POLE, PTEN, RAD50, RAD51C, RAD51D, RNF43, SDHB, SDHC, SDHD, SMAD4, SMARCA4. STK11, TP53, TSC1, TSC2, and VHL.  The following genes were evaluated for sequence changes only: SDHA and HOXB13 c.251G>A variant only.    09/24/2019 Surgery   RIGHT BREAST LUMPECTOMY WITH SENTINEL LYMPH NODE BIOPSY by Dr Marlou Starks  09/24/19    09/24/2019 Pathology Results   FINAL MICROSCOPIC DIAGNOSIS: 09/24/19   A. LYMPH NODE, RIGHT #1, SENTINEL, BIOPSY:  - There is no evidence of carcinoma in 1 of 1 lymph node (0/1).   B. LYMPH NODE, RIGHT, SENTINEL, BIOPSY:  - There is no evidence of carcinoma in 1 of 1 lymph node (0/1).   C. 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   D. LYMPH NODE, RIGHT #2, SENTINEL, BIOPSY:  - There is no evidence of carcinoma in 1 of 1 lymph node (0/1).   E. LYMPH NODE, RIGHT #3 , SENTINEL, BIOPSY:  - There is no evidence of carcinoma in 1 of 1 lymph node (0/1).   F. LYMPH NODE, RIGHT, SENTINEL, BIOPSY:  - There is no evidence of carcinoma in 1 of 1 lymph node (0/1).   G. LYMPH NODE, RIGHT, SENTINEL, BIOPSY:  -There is no evidence of carcinoma in 1 of 1 lymph node (0/1).   H. 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  - Ductal carcinoma in situ, intermediate grade.  - Perineural invasion is identified.  - The surgical resection margins are negative for carcinoma.  - See oncology table below   I. BREAST, RIGHT ADDITIONAL INFERIOR MARGIN, EXCISION:  - Fibrocystic  changes.  - There is no evidence of malignancy.     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 Postoperative findings in the right breast and axilla. Upper normal sized right axillary lymph nodes, nonspecific. 2. 2 mm superior apical segment right upper lobe nodule is technically nonspecific although statistically likely to be benign. 3. 3.6 cm hypodense right adnexal structure, probably a cyst but technically nonspecific. If patient is premenopausal, no follow up is warranted; if early postmenopausal, follow up ultrasound in 6-12 months would be recommended. 4. Other imaging findings of potential clinical significance: Small type 1 hiatal hernia. Descending and sigmoid colon diverticulosis. Small indirect left inguinal hernia contains adipose tissue.   Aortic Atherosclerosis (ICD10-I70.0).   10/26/2019 Imaging  Whole Body Bone scan 10/26/19  IMPRESSION: Nonspecific sites of uptake at the RIGHT ischium and anterior LEFT sixth rib, cannot exclude metastatic foci.   Otherwise negative exam.     10/29/2019 - 04/04/2020 Chemotherapy   Adjuvant AC q2weeks for 4 cycles starting 10/29/19-12/21/19 followed by weekly Taxol for 12 weeks starting 01/11/20. Taxol reduced 30% starting with C6. Completed on 04/04/20   03/24/2020 Breast MRI   IMPRESSION: 1. Postsurgical changes in the  right breast and right axilla. 2. No MRI evidence of malignancy in either breast.   RECOMMENDATION: Diagnostic bilateral mammogram in September 2021.   BI-RADS CATEGORY  2: Benign.   04/21/2020 - 06/18/2020 Radiation Therapy   adjuvant Radiation in [facility] 04/21/20-06/18/20 with Marguerita Merles      CURRENT THERAPY:  Letrozole 2.$RemoveBefo'5mg'utnOyuJiNZn$  daily starting ***   INTERVAL HISTORY: *** Paula Davidson is here for a follow up of right breast cancer. She was last seen by me 9 months ago. She presents to the clinic alone.     REVIEW OF SYSTEMS:  *** Constitutional: Denies fevers, chills or abnormal weight loss Eyes: Denies blurriness of vision Ears, nose, mouth, throat, and face: Denies mucositis or sore throat Respiratory: Denies cough, dyspnea or wheezes Cardiovascular: Denies palpitation, chest discomfort or lower extremity swelling Gastrointestinal:  Denies nausea, heartburn or change in bowel habits Skin: Denies abnormal skin rashes Lymphatics: Denies new lymphadenopathy or easy bruising Neurological:Denies numbness, tingling or new weaknesses Behavioral/Psych: Mood is stable, no new changes  All other systems were reviewed with the patient and are negative.  MEDICAL HISTORY:  Past Medical History:  Diagnosis Date  . Anxiety   . Cancer ([place]) 09/24/2019   right breast cancer-had lumpectomy  . Depression   . DUB (dysfunctional uterine bleeding)   . Family history of pancreatic cancer   . Hypertension   . Personal history of chemotherapy   . Personal history of radiation therapy     SURGICAL HISTORY: Past Surgical History:  Procedure Laterality Date  . BREAST LUMPECTOMY    . BREAST LUMPECTOMY WITH AXILLARY LYMPH NODE BIOPSY Right 09/24/2019   Procedure: RIGHT BREAST LUMPECTOMY WITH SENTINEL LYMPH NODE BIOPSY;  Surgeon: Jovita Kussmaul, MD;  Location: [facility];  Service: General;  Laterality: Right;  . BREAST SURGERY  09/24/2019   Breast lump excised  . FOOT  SURGERY    . LAPAROSCOPIC GASTRIC BANDING    . PORTACATH PLACEMENT N/A 10/25/2019   Procedure: INSERTION PORT-A-CATH WITH POSSIBLE ULTRASOUND;  Surgeon: Jovita Kussmaul, MD;  Location: [facility];  Service: General;  Laterality: N/A;  . WISDOM TOOTH EXTRACTION      I have reviewed the social history and family history with the patient and they are unchanged from previous note.  ALLERGIES:  is allergic to hydrochlorothiazide, codeine, and lisinopril.  MEDICATIONS:  Current Outpatient Medications  Medication Sig Dispense Refill  . amLODipine (NORVASC) 10 MG tablet Take 10 mg by mouth daily.    Marland Kitchen Bioflavonoid Products (ESTER C PO) Take 1 tablet by mouth daily.    . Calcium Carbonate-Vitamin D (CALCIUM + D PO) Take 1 tablet by mouth daily.     . cetirizine (ZYRTEC) 10 MG tablet Take 10 mg by mouth daily as needed for allergies.    . cholecalciferol (VITAMIN D) 25 MCG (1000 UT) tablet Take 1,000 Units by mouth daily.    . citalopram (CELEXA) 20 MG tablet Take 20 mg by mouth daily.    Marland Kitchen gabapentin (NEURONTIN) 100 MG capsule TAKE  1 CAPSULE(100 MG) BY MOUTH AT BEDTIME. INCREASE TO 2 CAPSULES IN A WEEK, AND 3 CAPSULES IN 2 WEEKS, IF SHE TOLERATES WELL 90 capsule 1  . HYDROcodone-acetaminophen (NORCO/VICODIN) 5-325 MG tablet Take 1-2 tablets by mouth every 6 (six) hours as needed for moderate pain or severe pain. (Patient not taking: Reported on 02/29/2020) 10 tablet 0  . letrozole (FEMARA) 2.5 MG tablet TAKE 1 TABLET(2.5 MG) BY MOUTH DAILY 30 tablet 3  . LORazepam (ATIVAN) 0.5 MG tablet Take 1-2 tablets (0.5-1 mg total) by mouth every 8 (eight) hours as needed for anxiety (nausea). 20 tablet 0  . losartan (COZAAR) 100 MG tablet Take 100 mg by mouth daily.    . Multiple Vitamin (MULTIVITAMIN WITH MINERALS) TABS tablet Take 1 tablet by mouth daily.    . promethazine (PHENERGAN) 25 MG tablet Take 1 tablet (25 mg total) by mouth every 6 (six) hours as needed for nausea or vomiting. (Patient not taking:  Reported on 02/29/2020) 30 tablet 0  . traZODone (DESYREL) 100 MG tablet Take 1 tablet (100 mg total) by mouth at bedtime. 30 tablet 0   No current facility-administered medications for this visit.    PHYSICAL EXAMINATION: ECOG PERFORMANCE STATUS: {CHL ONC ECOG PS:[phone]}  There were no vitals filed for this visit. There were no vitals filed for this visit. *** GENERAL:alert, no distress and comfortable SKIN: skin color, texture, turgor are normal, no rashes or significant lesions EYES: normal, Conjunctiva are pink and non-injected, sclera clear {OROPHARYNX:no exudate, no erythema and lips, buccal mucosa, and tongue normal}  NECK: supple, thyroid normal size, non-tender, without nodularity LYMPH:  no palpable lymphadenopathy in the cervical, axillary {or inguinal} LUNGS: clear to auscultation and percussion with normal breathing effort HEART: regular rate & rhythm and no murmurs and no lower extremity edema ABDOMEN:abdomen soft, non-tender and normal bowel sounds Musculoskeletal:no cyanosis of digits and no clubbing  NEURO: alert & oriented x 3 with fluent speech, no focal motor/sensory deficits  LABORATORY DATA:  I have reviewed the data as listed CBC Latest Ref Rng & Units 06/27/2020 04/04/2020 03/28/2020  WBC 4.0 - 10.5 K/uL 4.0 5.2 5.3  Hemoglobin 12.0 - 15.0 g/dL 11.3(L) 10.6(L) 10.7(L)  Hematocrit 36.0 - 46.0 % 35.8(L) 33.0(L) 33.2(L)  Platelets 150 - 400 K/uL 302 355 391     CMP Latest Ref Rng & Units 06/27/2020 04/04/2020 03/28/2020  Glucose 70 - 99 mg/dL 81 118(H) 96  BUN 6 - 20 mg/dL $Remove'8 6 8  'ElySPeY$ Creatinine 0.44 - 1.00 mg/dL 0.80 0.77 0.74  Sodium 135 - 145 mmol/L 140 142 139  Potassium 3.5 - 5.1 mmol/L 3.6 3.8 4.0  Chloride 98 - 111 mmol/L 106 107 106  CO2 22 - 32 mmol/L $RemoveB'24 25 24  'YYzFVzUo$ Calcium 8.9 - 10.3 mg/dL 10.4(H) 9.8 9.7  Total Protein 6.5 - 8.1 g/dL 7.5 7.1 7.3  Total Bilirubin 0.3 - 1.2 mg/dL 0.4 0.2(L) 0.3  Alkaline Phos 38 - 126 U/L 68 52 50  AST 15 - 41 U/L $Remo'17 19  18  'TbyxG$ ALT 0 - 44 U/L $Remo'21 27 26      'UmUQL$ RADIOGRAPHIC STUDIES: I have personally reviewed the radiological images as listed and agreed with the findings in the report. No results found.   ASSESSMENT & PLAN:  Paula Davidson is a 50 y.o. female with    1Malignant neoplasm of upper-outer quadrant of right breast,pT2N1aM0,stage IB,ER+/PR+, HER2-, [place] -She was diagnosed in 08/2019. Shehas had a right breast mass for 7-8 yearsand recently grew.Her [place]  and Biopsy show grade II invasive mammary carcinoma of right breast and lymph node negative.MRI showed tumor 2.5cm, 1 positive axillary LN and a 7-44mm indeterminate mass that biopsies and found to be Fibroadenoma.  -She underwent right lumpectomy and SLNB with Dr. Marlou Starks on 09/24/19.Her mammaprint showedHigh Risk Luminal Type B with 29% risk of recurrence in 10 years. -To reduce her high risk adjuvant chemoshe completed adjuvant AC-T chemo on 04/04/20.   -To reduce her risk of local recurrence, she underwent adjuvant Radiation in [facility] 04/21/20-06/18/20. She tolerated well.  -She is recovering from prior treatment well. She has residual numbness in toes from chemo and residual dysphagia from RT. Side effects are improving and will likely resolve with time. Her energy is returning.  -Given her strong ER and PR expression, I recommend adjuvant antiestrogen therapy. I discussed option of pre and perimenopausal Tamoxifen and Postmenopausal AI with Letrozole. I discussed AI is slightly stronger than Tamoxifen. I would recommend AI for this reason if she is postmenpopausal. She has not had period for years due to IUD and chemo. I will check hormone level for better evaluation. I discussed option of Ovarian suppression with injections or BSO in order to take AI. She declined for now. She is fine to start with Tamoxifen if indicated.              -The potential benefit and side effects, which includes but not limited to, hot flash, skin and vaginal dryness,  metabolic changes ( increased blood glucose, cholesterol, weight, etc.), slightly in increased risk of cardiovascular disease, cataracts, muscular and joint discomfort, osteopenia and osteoporosis, etc, were discussed with her in great details.  I also discussed small risk of thrombosis and endometrial cancer from tamoxifen.  She is interested. I will call in medication based on her hormonal level. I gave her print out of medications.  -Will obtain baseline DEXA when she is postmenopausal.  -She is fine to have PAC removed. She will F/u with Dr Marlou Starks soon.  -She will also proceed with breast cancer surveillance. Her  03/2020 MRI was normal. Next Mammogram in 07/2020.  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  -S/p chemo her neuropathy is improving. Currently mild and residual in toes.  -She is on Gabapentin $RemoveBefor'100mg'ZSouXPaBiXZK$ , 4 tabs nightly. I recommend she start weaning down if tolerable. She can reduce to 3 tabs nightly to start.    3. Genetic Testingnegative for pathogenetic mutations -She does haveVUS of BRCA1 c.2048A>G  4. Menorrhagia  -Shehassevere menorrhagia with long term bleeding. Shepreviouslyrequired IV iron.Her Gyn started Mirena 5 years ago before breast cancer diagnosis which stopped her periods.  -She had Mirena removed in 08/2019. Her period has not returned yet.She remains to not have period, during or after chemo.  -She has h/o of ovarian cyst. She currently has one of right ovary as seen on 10/26/19 CT scan. Will f/u with Gyn.   5. HTN, Depression, Anxiety, obesity -On Amlodipine, Losartan, Lopressorand Celexa -She notes she lives with [facility].  6. Anemia  -Has been secondary to menorrhagia in the past, now secondary to chemo  -Remains mild. Hg at 11.3 today (06/27/20).   7. Insomnia -She had trouble sleeping prior to her cancer diagnosis.  She previously tried Melatonin and Amitriptyline with little help.   -She is on Ativan as needed and she gets better sleep with Trazodone.  -  She has been counseled on drug interaction with Celexa and Trazodone and knows not to take it at the same time given small drug interaction.   PLAN: -Labs with [place] and estradiol levels today, based on the results, I will call you tamoxifen or letrozole to be started 3 to 3 weeks -Mammogram in 07/2020  -Proceed with PAC removal with Dr Marlou Starks  -Survivorship clinic with NP Lacie in 3 months  -Lab and f/u in 6 months    No problem-specific Assessment & Plan notes found for this encounter.   No orders of the defined types were placed in this encounter.  All questions were answered. The patient knows to call the clinic with any problems, questions or concerns. No barriers to learning was detected. The total time spent in the appointment was {CHL ONC TIME VISIT - RWKET:[id number]}.     Joslyn Devon 03/25/2021   Oneal Deputy, am acting as scribe for Truitt Merle, MD.   {Add scribe attestation statement}

## 2021-03-30 ENCOUNTER — Inpatient Hospital Stay: Payer: BLUE CROSS/BLUE SHIELD

## 2021-03-30 ENCOUNTER — Inpatient Hospital Stay: Payer: BLUE CROSS/BLUE SHIELD | Admitting: Hematology

## 2021-03-30 ENCOUNTER — Other Ambulatory Visit: Payer: Self-pay

## 2021-03-30 ENCOUNTER — Telehealth: Payer: Self-pay | Admitting: Hematology

## 2021-03-30 DIAGNOSIS — C50411 Malignant neoplasm of upper-outer quadrant of right female breast: Secondary | ICD-10-CM

## 2021-03-30 DIAGNOSIS — Z170 Estrogen receptor positive status [ER+]: Secondary | ICD-10-CM

## 2021-03-30 NOTE — Telephone Encounter (Signed)
R/s 516 appt per patient request due to her being sick. Confirmed new date and time

## 2021-04-01 NOTE — Progress Notes (Signed)
[facility]   [phone] [phone]   Clinic Follow up Note   Patient Care Team: Burnett Sheng, MD as PCP - General (Family Medicine) Mauro Kaufmann, RN as Oncology Nurse Navigator Rockwell Germany, RN as Oncology Nurse Navigator Jovita Kussmaul, MD as Consulting Physician (General Surgery) Truitt Merle, MD as Consulting Physician (Hematology) Eppie Gibson, MD as Attending Physician (Radiation Oncology) Leanna Sato, MD as Referring Physician Alla Feeling, NP as Nurse Practitioner (Nurse Practitioner)  Date of Service:  04/06/2021  CHIEF COMPLAINT: F/u of right breast cancer  SUMMARY OF ONCOLOGIC HISTORY: Oncology History Overview Note  Cancer Staging Malignant neoplasm of upper-outer quadrant of right breast in female, estrogen receptor positive ([place]) Staging form: Breast, AJCC 8th Edition - Clinical stage from 08/28/2019: Stage IB (cT2, cN0, cM0, G2, ER+, PR+, HER2-) - Signed by Truitt Merle, MD on 09/04/2019 - Pathologic stage from 09/24/2019: Stage IB (pT2, pN1a, cM0, G2, ER+, PR+, HER2-) - Signed by Truitt Merle, MD on 10/10/2019    Malignant neoplasm of upper-outer quadrant of right breast in female, estrogen receptor positive ([place])  08/24/2019 Mammogram   Diagnostic mammogram and [place] 08/24/19 IMPRESSION: Suspicious right breast mass 10 o'clock 1 cm from the nipple measuring 3.3 x 1.9 x 1.9 cm and axillary adenopathy.   08/28/2019 Initial Biopsy   Diagnosis 08/28/19 1. Breast, right, needle core biopsy, 10 o'clock - INVASIVE MAMMARY CARCINOMA, GRADE II. - SEE MICROSCOPIC DESCRIPTION. 2. Lymph node, needle/core biopsy, right axilla - BENIGN LYMPH NODE. - NO METASTATIC CARCINOMA IDENTIFIED.    08/28/2019 Receptors her2   Results: IMMUNOHISTOCHEMICAL AND MORPHOMETRIC ANALYSIS PERFORMED MANUALLY The tumor cells are NEGATIVE for Her2 (1+). Estrogen Receptor: 100%, POSITIVE, STRONG STAINING INTENSITY Progesterone Receptor:  100%, POSITIVE, STRONG STAINING INTENSITY Proliferation Marker Ki67: 10%   08/28/2019 Cancer Staging   Staging form: Breast, AJCC 8th Edition - Clinical stage from 08/28/2019: Stage IB (cT2, cN0, cM0, G2, ER+, PR+, HER2-) - Signed by Truitt Merle, MD on 09/04/2019   08/31/2019 Initial Diagnosis   Malignant neoplasm of upper-outer quadrant of right breast in female, estrogen receptor positive ([place])   09/07/2019 Breast MRI   Breast MRI 09/07/19  IMPRESSION: 1. 2.5 cm known malignancy in the anterior right breast, superficial depth near the nipple. 2. 7-8 mm indeterminate mass in the central upper outer quadrant of the right breast. 3. One right axillary lymph node with apparent mild cortical thickening, which lies slightly anterior and inferior to the biopsied right axillary lymph node ( benign reactive on pathology). This is most likely also benign given the pathology from the adjacent biopsied lymph node. 4. No evidence of left breast malignancy.   09/17/2019 Imaging   [place] right breast 09/17/19  IMPRESSION: Several benign appearing masses in the upper outer quadrant of the right breast without a definite correlate to the finding seen on prior MRI. The right axillary lymph nodes appear similar to the recently biopsied benign lymph node.   09/20/2019 Pathology Results   Diagnosis 09/20/19  Breast, right, needle core biopsy, upper outer quadrant - FIBROADENOMA - NO MALIGNANCY IDENTIFIED    09/21/2019 Genetic Testing   Negative genetic testing:  No pathogenic variants detected on the [facility] Breast Cancer STAT panel and Common Hereditary Cancers panel.  A variant of uncertain significance was identified in the BRCA1 gene, called c.2048A>G ([id number]).  The report date is 09/21/2019.  The STAT Breast cancer panel offered by [facility] includes sequencing and rearrangement analysis for the following  9 genes:  ATM, BRCA1, BRCA2, CDH1, CHEK2, PALB2, PTEN, STK11 and TP53.   The Common  Hereditary Cancers Panel offered by [facility] includes sequencing and/or deletion duplication testing of the following 48 genes: APC, ATM, AXIN2, BARD1, BMPR1A, BRCA1, BRCA2, BRIP1, CDH1, CDK4, CDKN2A (p14ARF), CDKN2A (p16INK4a), CHEK2, CTNNA1, DICER1, EPCAM (Deletion/duplication testing only), GREM1 (promoter region deletion/duplication testing only), KIT, MEN1, MLH1, MSH2, MSH3, MSH6, MUTYH, NBN, NF1, NHTL1, PALB2, PDGFRA, PMS2, POLD1, POLE, PTEN, RAD50, RAD51C, RAD51D, RNF43, SDHB, SDHC, SDHD, SMAD4, SMARCA4. STK11, TP53, TSC1, TSC2, and VHL.  The following genes were evaluated for sequence changes only: SDHA and HOXB13 c.251G>A variant only.    09/24/2019 Surgery   RIGHT BREAST LUMPECTOMY WITH SENTINEL LYMPH NODE BIOPSY by Dr Marlou Starks  09/24/19    09/24/2019 Pathology Results   FINAL MICROSCOPIC DIAGNOSIS: 09/24/19   A. LYMPH NODE, RIGHT #1, SENTINEL, BIOPSY:  - There is no evidence of carcinoma in 1 of 1 lymph node (0/1).   B. LYMPH NODE, RIGHT, SENTINEL, BIOPSY:  - There is no evidence of carcinoma in 1 of 1 lymph node (0/1).   C. 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   D. LYMPH NODE, RIGHT #2, SENTINEL, BIOPSY:  - There is no evidence of carcinoma in 1 of 1 lymph node (0/1).   E. LYMPH NODE, RIGHT #3 , SENTINEL, BIOPSY:  - There is no evidence of carcinoma in 1 of 1 lymph node (0/1).   F. LYMPH NODE, RIGHT, SENTINEL, BIOPSY:  - There is no evidence of carcinoma in 1 of 1 lymph node (0/1).   G. LYMPH NODE, RIGHT, SENTINEL, BIOPSY:  -There is no evidence of carcinoma in 1 of 1 lymph node (0/1).   H. 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  - Ductal carcinoma in situ, intermediate grade.  - Perineural invasion is identified.  - The surgical resection margins are negative for carcinoma.  - See oncology table below   I. BREAST, RIGHT ADDITIONAL INFERIOR MARGIN, EXCISION:  - Fibrocystic  changes.  - There is no evidence of malignancy.     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 Postoperative findings in the right breast and axilla. Upper normal sized right axillary lymph nodes, nonspecific. 2. 2 mm superior apical segment right upper lobe nodule is technically nonspecific although statistically likely to be benign. 3. 3.6 cm hypodense right adnexal structure, probably a cyst but technically nonspecific. If patient is premenopausal, no follow up is warranted; if early postmenopausal, follow up ultrasound in 6-12 months would be recommended. 4. Other imaging findings of potential clinical significance: Small type 1 hiatal hernia. Descending and sigmoid colon diverticulosis. Small indirect left inguinal hernia contains adipose tissue.   Aortic Atherosclerosis (ICD10-I70.0).   10/26/2019 Imaging  Whole Body Bone scan 10/26/19  IMPRESSION: Nonspecific sites of uptake at the RIGHT ischium and anterior LEFT sixth rib, cannot exclude metastatic foci.   Otherwise negative exam.     10/29/2019 - 04/04/2020 Chemotherapy   Adjuvant AC q2weeks for 4 cycles starting 10/29/19-12/21/19 followed by weekly Taxol for 12 weeks starting 01/11/20. Taxol reduced 30% starting with C6. Completed on 04/04/20   03/24/2020 Breast MRI   IMPRESSION: 1. Postsurgical changes in the  right breast and right axilla. 2. No MRI evidence of malignancy in either breast.   RECOMMENDATION: Diagnostic bilateral mammogram in September 2021.   BI-RADS CATEGORY  2: Benign.   04/21/2020 - 06/18/2020 Radiation Therapy   adjuvant Radiation in [facility] 04/21/20-06/18/20 with Marguerita Merles   12/2020 -  Anti-estrogen oral therapy   Letrozole 2.$RemoveBefo'5mg'dcWVsYnNwST$  once daily starting 12/2020      CURRENT THERAPY:  Letrozole 2.$RemoveBefo'5mg'DycBzHUTGIU$  once daily starting 12/2020  INTERVAL HISTORY:  Wade Sigala is here for a follow up of right breast cancer. She was last seen by me over 6 months ago. She presents to the clinic alone. She is doing well overall. Since she started letrozole she has had hot flashes which effect her sleep. She also has gained weight. She is interested in management for this.  She is otherwise doing well, denies any new complaints, arthralgia, or other new symptoms.   REVIEW OF SYSTEMS:   Constitutional: Denies fevers, chills or abnormal weight loss (+) Hot flashes  Eyes: Denies blurriness of vision Ears, nose, mouth, throat, and face: Denies mucositis or sore throat Respiratory: Denies cough, dyspnea or wheezes Cardiovascular: Denies palpitation, chest discomfort or lower extremity swelling Gastrointestinal:  Denies nausea, heartburn or change in bowel habits Skin: Denies abnormal skin rashes Lymphatics: Denies new lymphadenopathy or easy bruising Neurological:Denies numbness, tingling or new weaknesses Behavioral/Psych: Mood is stable, no new changes  All other systems were reviewed with the patient and are negative.  MEDICAL HISTORY:  Past Medical History:  Diagnosis Date  . Anxiety   . Cancer ([place]) 09/24/2019   right breast cancer-had lumpectomy  . Depression   . DUB (dysfunctional uterine bleeding)   . Family history of pancreatic cancer   . Hypertension   . Personal history of chemotherapy   . Personal history of radiation therapy     SURGICAL HISTORY: Past Surgical History:   Procedure Laterality Date  . BREAST LUMPECTOMY    . BREAST LUMPECTOMY WITH AXILLARY LYMPH NODE BIOPSY Right 09/24/2019   Procedure: RIGHT BREAST LUMPECTOMY WITH SENTINEL LYMPH NODE BIOPSY;  Surgeon: Jovita Kussmaul, MD;  Location: [facility];  Service: General;  Laterality: Right;  . BREAST SURGERY  09/24/2019   Breast lump excised  . FOOT SURGERY    . LAPAROSCOPIC GASTRIC BANDING    . PORTACATH PLACEMENT N/A 10/25/2019   Procedure: INSERTION PORT-A-CATH WITH POSSIBLE ULTRASOUND;  Surgeon: Jovita Kussmaul, MD;  Location: [facility];  Service: General;  Laterality: N/A;  . WISDOM TOOTH EXTRACTION      I have reviewed the social history and family history with the patient and they are unchanged from previous note.  ALLERGIES:  is allergic to hydrochlorothiazide, codeine, and lisinopril.  MEDICATIONS:  Current Outpatient Medications  Medication Sig Dispense Refill  . amLODipine (NORVASC) 10 MG tablet Take 10 mg by mouth daily.    Marland Kitchen Bioflavonoid Products (ESTER C PO) Take 1 tablet by mouth daily.    . Calcium Carbonate-Vitamin D (CALCIUM + D PO) Take 1 tablet by mouth  daily.     . cetirizine (ZYRTEC) 10 MG tablet Take 10 mg by mouth daily as needed for allergies.    . cholecalciferol (VITAMIN D) 25 MCG (1000 UT) tablet Take 1,000 Units by mouth daily.    . citalopram (CELEXA) 20 MG tablet Take 20 mg by mouth daily.    Marland Kitchen gabapentin (NEURONTIN) 100 MG capsule TAKE 1 CAPSULE(100 MG) BY MOUTH AT BEDTIME. INCREASE TO 2 CAPSULES IN A WEEK, AND 3 CAPSULES IN 2 WEEKS, IF SHE TOLERATES WELL 90 capsule 1  . HYDROcodone-acetaminophen (NORCO/VICODIN) 5-325 MG tablet Take 1-2 tablets by mouth every 6 (six) hours as needed for moderate pain or severe pain. (Patient not taking: Reported on 02/29/2020) 10 tablet 0  . letrozole (FEMARA) 2.5 MG tablet Take 1 tablet (2.5 mg total) by mouth daily. 90 tablet 1  . LORazepam (ATIVAN) 0.5 MG tablet Take 1-2 tablets (0.5-1 mg total) by mouth every 8 (eight)  hours as needed for anxiety (nausea). 20 tablet 0  . losartan (COZAAR) 100 MG tablet Take 100 mg by mouth daily.    . Multiple Vitamin (MULTIVITAMIN WITH MINERALS) TABS tablet Take 1 tablet by mouth daily.    . promethazine (PHENERGAN) 25 MG tablet Take 1 tablet (25 mg total) by mouth every 6 (six) hours as needed for nausea or vomiting. (Patient not taking: Reported on 02/29/2020) 30 tablet 0  . traZODone (DESYREL) 100 MG tablet Take 1 tablet (100 mg total) by mouth at bedtime as needed for sleep. 30 tablet 0   No current facility-administered medications for this visit.    PHYSICAL EXAMINATION: ECOG PERFORMANCE STATUS: 0 - Asymptomatic  Vitals:   04/06/21 0825  BP: 138/86  Pulse: 80  Resp: 17  Temp: 97.9 F (36.6 C)  SpO2: 100%   Filed Weights   04/06/21 0825  Weight: (!) 300 lb 1.6 oz (136.1 kg)    GENERAL:alert, no distress and comfortable SKIN: skin color, texture, turgor are normal, no rashes or significant lesions EYES: normal, Conjunctiva are pink and non-injected, sclera clear  NECK: supple, thyroid normal size, non-tender, without nodularity LYMPH:  no palpable lymphadenopathy in the cervical, axillary  LUNGS: clear to auscultation and percussion with normal breathing effort HEART: regular rate & rhythm and no murmurs and no lower extremity edema ABDOMEN:abdomen soft, non-tender and normal bowel sounds Musculoskeletal:no cyanosis of digits and no clubbing  NEURO: alert & oriented x 3 with fluent speech, no focal motor/sensory deficits BREAST: S/p right lumpectomy: surgical incision healed well. No palpable mass, nodules or adenopathy bilaterally. Breast exam benign.   LABORATORY DATA:  I have reviewed the data as listed CBC Latest Ref Rng & Units 04/06/2021 06/27/2020 04/04/2020  WBC 4.0 - 10.5 K/uL 7.2 4.0 5.2  Hemoglobin 12.0 - 15.0 g/dL 12.3 11.3(L) 10.6(L)  Hematocrit 36.0 - 46.0 % 37.9 35.8(L) 33.0(L)  Platelets 150 - 400 K/uL 390 302 355     CMP Latest Ref  Rng & Units 04/06/2021 06/27/2020 04/04/2020  Glucose 70 - 99 mg/dL 88 81 118(H)  BUN 6 - 20 mg/dL $Remove'8 8 6  'toZeQYV$ Creatinine 0.44 - 1.00 mg/dL 0.76 0.80 0.77  Sodium 135 - 145 mmol/L 141 140 142  Potassium 3.5 - 5.1 mmol/L 3.6 3.6 3.8  Chloride 98 - 111 mmol/L 104 106 107  CO2 22 - 32 mmol/L $RemoveB'27 24 25  'FndVNwrh$ Calcium 8.9 - 10.3 mg/dL 10.0 10.4(H) 9.8  Total Protein 6.5 - 8.1 g/dL 8.2(H) 7.5 7.1  Total Bilirubin 0.3 - 1.2 mg/dL 0.3  0.4 0.2(L)  Alkaline Phos 38 - 126 U/L 91 68 52  AST 15 - 41 U/L 14(L) 17 19  ALT 0 - 44 U/L $Remo'19 21 27      'cyzDa$ RADIOGRAPHIC STUDIES: I have personally reviewed the radiological images as listed and agreed with the findings in the report. No results found.   ASSESSMENT & PLAN:  Paula Davidson is a 50 y.o. female with    1Malignant neoplasm of upper-outer quadrant of right breast,pT2N1aM0,stage IB,ER+/PR+, HER2-, [place] -She was diagnosed in 08/2019. Shehas had a right breast mass for 7-8 yearsand recently grew.Her [place] and Biopsy show grade II invasive mammary carcinoma of right breast and lymph node negative.MRI showed tumor 2.5cm, 1 positive axillary LN and a 7-56mm indeterminate mass that biopsies and found to be Fibroadenoma.  -She underwent right lumpectomy and SLNB with Dr. Marlou Starks on 09/24/19.Her mammaprint showedHigh Risk Luminal Type B with 29% risk of recurrence in 10 years. -To reduce her high risk adjuvant chemoshe completed adjuvant AC-T chemo on 04/04/20. To reduce her risk of local recurrence, she underwent adjuvant [facility] 04/[facility]20-06/18/20.  -I started her on antiestrogen therapy with Letrozole from 12/2020. She has tolerated moderately well with weight gain and hot flashes. I reviewed management with her.  -She is clinically doing well. Lab reviewed, her CBC and CMP are within normal limits. Her physical exam and her 07/2020 mammogram were unremarkable. There is no clinical concern for recurrence. -Continue surveillance. Next mammogram in 07/2021.   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  -S/p chemo her neuropathy is improving. Currently mild and residual in toes.  -She is on Gabapentin $RemoveBefor'100mg'wahKXWQQxZIe$ , 4 tabs nightly. I recommend she start weaning down if tolerable. She can reduce to 3 tabs nightly to start.    3. Genetic Testingnegative for pathogenetic mutations -She does haveVUS of BRCA1 c.2048A>G  4. Menorrhagia  -Shehassevere menorrhagia with long term bleeding. Shepreviouslyrequired IV iron.Her Gyn started Mirena 5 years ago before breast cancer diagnosis which stopped her periods.  -She had Mirena removed in 08/2019. Her period has not returned yet.She remains to not have period, during or after chemo.  -She has h/o of ovarian cyst. She currently has one of right ovary as seen on 10/26/19 CT scan. Will f/u with Gyn.  5. HTN, Depression, Anxiety, obesity -On Amlodipine, Losartan, Lopressorand Celexa -She notes she lives with [place]. -On Letrozole she has gained more weight. I discussed weight management with low carb diet and exercise. I offered referral to [facility]. She is interested.   6. Anemia  -Has been secondary to menorrhagia in the past, now secondary to chemo  -Has mostly resolved on labs today (04/06/21)  7. Insomnia -She had trouble sleeping prior to her cancer diagnosis. She previously tried Melatonin and Amitriptyline with little help.   -She is on Ativan as needed and she gets better sleep with Trazodone.  -She has been counseled on drug interaction with Celexa and Trazodone and knows not to take it at the same time given small drug interaction.   PLAN: -Send referral to [facility] for weight loss  -Mammogram in 07/2021 -Continue letrozole, refilled today  -Lab and f/u in 4 months    No problem-specific Assessment & Plan notes found for this  encounter.   Orders Placed This Encounter  Procedures  . Amb Ref to Medical Weight Management    Referral Priority:  Routine    Referral Type:   Consultation    Number of Visits Requested:   1   All questions were answered. The patient knows to call the clinic with any problems, questions or concerns. No barriers to learning was detected. The total time spent in the appointment was 30 minutes.     Truitt Merle, MD 04/06/2021   I, Joslyn Devon, am acting as scribe for Truitt Merle, MD.   I have reviewed the above documentation for accuracy and completeness, and I agree with the above.

## 2021-04-06 ENCOUNTER — Inpatient Hospital Stay (HOSPITAL_BASED_OUTPATIENT_CLINIC_OR_DEPARTMENT_OTHER): Payer: BLUE CROSS/BLUE SHIELD | Admitting: Hematology

## 2021-04-06 ENCOUNTER — Other Ambulatory Visit: Payer: Self-pay

## 2021-04-06 ENCOUNTER — Encounter: Payer: Self-pay | Admitting: Hematology

## 2021-04-06 ENCOUNTER — Inpatient Hospital Stay: Payer: BLUE CROSS/BLUE SHIELD | Attending: Hematology

## 2021-04-06 ENCOUNTER — Telehealth: Payer: Self-pay | Admitting: Hematology

## 2021-04-06 VITALS — BP 138/86 | HR 80 | Temp 97.9°F | Resp 17 | Ht 66.0 in | Wt 300.1 lb

## 2021-04-06 DIAGNOSIS — R911 Solitary pulmonary nodule: Secondary | ICD-10-CM | POA: Diagnosis not present

## 2021-04-06 DIAGNOSIS — K449 Diaphragmatic hernia without obstruction or gangrene: Secondary | ICD-10-CM | POA: Insufficient documentation

## 2021-04-06 DIAGNOSIS — C50411 Malignant neoplasm of upper-outer quadrant of right female breast: Secondary | ICD-10-CM | POA: Diagnosis not present

## 2021-04-06 DIAGNOSIS — I10 Essential (primary) hypertension: Secondary | ICD-10-CM | POA: Diagnosis not present

## 2021-04-06 DIAGNOSIS — K5730 Diverticulosis of large intestine without perforation or abscess without bleeding: Secondary | ICD-10-CM | POA: Insufficient documentation

## 2021-04-06 DIAGNOSIS — F418 Other specified anxiety disorders: Secondary | ICD-10-CM | POA: Insufficient documentation

## 2021-04-06 DIAGNOSIS — I700 Atherosclerosis of aorta: Secondary | ICD-10-CM | POA: Diagnosis not present

## 2021-04-06 DIAGNOSIS — Z79899 Other long term (current) drug therapy: Secondary | ICD-10-CM | POA: Insufficient documentation

## 2021-04-06 DIAGNOSIS — D6481 Anemia due to antineoplastic chemotherapy: Secondary | ICD-10-CM | POA: Insufficient documentation

## 2021-04-06 DIAGNOSIS — Z923 Personal history of irradiation: Secondary | ICD-10-CM | POA: Diagnosis not present

## 2021-04-06 DIAGNOSIS — E669 Obesity, unspecified: Secondary | ICD-10-CM | POA: Insufficient documentation

## 2021-04-06 DIAGNOSIS — Z9221 Personal history of antineoplastic chemotherapy: Secondary | ICD-10-CM | POA: Diagnosis not present

## 2021-04-06 DIAGNOSIS — G4700 Insomnia, unspecified: Secondary | ICD-10-CM | POA: Insufficient documentation

## 2021-04-06 DIAGNOSIS — Z170 Estrogen receptor positive status [ER+]: Secondary | ICD-10-CM | POA: Diagnosis not present

## 2021-04-06 DIAGNOSIS — N920 Excessive and frequent menstruation with regular cycle: Secondary | ICD-10-CM | POA: Insufficient documentation

## 2021-04-06 DIAGNOSIS — Z79811 Long term (current) use of aromatase inhibitors: Secondary | ICD-10-CM | POA: Insufficient documentation

## 2021-04-06 DIAGNOSIS — R232 Flushing: Secondary | ICD-10-CM | POA: Insufficient documentation

## 2021-04-06 LAB — CBC WITH DIFFERENTIAL (CANCER CENTER ONLY)
Abs Immature Granulocytes: 0.03 10*3/uL (ref 0.00–0.07)
Basophils Absolute: 0 10*3/uL (ref 0.0–0.1)
Basophils Relative: 1 %
Eosinophils Absolute: 0.4 10*3/uL (ref 0.0–0.5)
Eosinophils Relative: 6 %
HCT: 37.9 % (ref 36.0–46.0)
Hemoglobin: 12.3 g/dL (ref 12.0–15.0)
Immature Granulocytes: 0 %
Lymphocytes Relative: 22 %
Lymphs Abs: 1.6 10*3/uL (ref 0.7–4.0)
MCH: 25.6 pg — ABNORMAL LOW (ref 26.0–34.0)
MCHC: 32.5 g/dL (ref 30.0–36.0)
MCV: 78.8 fL — ABNORMAL LOW (ref 80.0–100.0)
Monocytes Absolute: 0.7 10*3/uL (ref 0.1–1.0)
Monocytes Relative: 10 %
Neutro Abs: 4.4 10*3/uL (ref 1.7–7.7)
Neutrophils Relative %: 61 %
Platelet Count: 390 10*3/uL (ref 150–400)
RBC: 4.81 MIL/uL (ref 3.87–5.11)
RDW: 14.7 % (ref 11.5–15.5)
WBC Count: 7.2 10*3/uL (ref 4.0–10.5)
nRBC: 0 % (ref 0.0–0.2)

## 2021-04-06 LAB — CMP (CANCER CENTER ONLY)
ALT: 19 U/L (ref 0–44)
AST: 14 U/L — ABNORMAL LOW (ref 15–41)
Albumin: 3.7 g/dL (ref 3.5–5.0)
Alkaline Phosphatase: 91 U/L (ref 38–126)
Anion gap: 10 (ref 5–15)
BUN: 8 mg/dL (ref 6–20)
CO2: 27 mmol/L (ref 22–32)
Calcium: 10 mg/dL (ref 8.9–10.3)
Chloride: 104 mmol/L (ref 98–111)
Creatinine: 0.76 mg/dL (ref 0.44–1.00)
GFR, Estimated: >60 mL/min (ref >60)
Glucose, Bld: 88 mg/dL (ref 70–99)
Potassium: 3.6 mmol/L (ref 3.5–5.1)
Sodium: 141 mmol/L (ref 135–145)
Total Bilirubin: 0.3 mg/dL (ref 0.3–1.2)
Total Protein: 8.2 g/dL — ABNORMAL HIGH (ref 6.5–8.1)

## 2021-04-06 MED ORDER — TRAZODONE HCL 100 MG PO TABS: 100.0000 mg | ORAL_TABLET | Freq: Every evening | ORAL | 0 refills | Status: DC | PRN

## 2021-04-06 MED ORDER — LETROZOLE 2.5 MG PO TABS: 2.5000 mg | ORAL_TABLET | Freq: Every day | ORAL | 1 refills | Status: DC

## 2021-04-06 NOTE — Telephone Encounter (Signed)
Scheduled follow-up appointment per 5/23 los. Patient is aware.

## 2021-04-07 ENCOUNTER — Encounter: Payer: Self-pay | Admitting: Hematology

## 2021-04-14 ENCOUNTER — Other Ambulatory Visit: Payer: Self-pay | Admitting: Nurse Practitioner

## 2021-04-14 MED ORDER — GABAPENTIN 100 MG PO CAPS: 300.0000 mg | ORAL_CAPSULE | Freq: Every day | ORAL | 1 refills | Status: DC

## 2021-05-06 ENCOUNTER — Other Ambulatory Visit: Payer: Self-pay | Admitting: Hematology

## 2021-05-15 ENCOUNTER — Encounter: Payer: Self-pay | Admitting: Hematology

## 2021-07-23 ENCOUNTER — Other Ambulatory Visit: Payer: Self-pay | Admitting: Hematology

## 2021-07-23 DIAGNOSIS — Z9889 Other specified postprocedural states: Secondary | ICD-10-CM

## 2021-07-30 ENCOUNTER — Encounter: Payer: Self-pay | Admitting: Hematology

## 2021-08-05 NOTE — Progress Notes (Signed)
[place]   Telephone:([phone]) [phone] Fax:(336) [phone]   Clinic Follow up Note   Patient Care Team: Burnett Sheng, MD as PCP - General (Family Medicine) Mauro Kaufmann, RN as Oncology Nurse Navigator Rockwell Germany, RN as Oncology Nurse Navigator Jovita Kussmaul, MD as Consulting Physician (General Surgery) Truitt Merle, MD as Consulting Physician (Hematology) Eppie Gibson, MD as Attending Physician (Radiation Oncology) Leanna Sato, MD as Referring Physician Alla Feeling, NP as Nurse Practitioner (Nurse Practitioner) 08/06/2021  CHIEF COMPLAINT: Follow up right breast cancer   SUMMARY OF ONCOLOGIC HISTORY: Oncology History Overview Note  Cancer Staging Malignant neoplasm of upper-outer quadrant of right breast in female, estrogen receptor positive ([place]) Staging form: Breast, AJCC 8th Edition - Clinical stage from 08/28/2019: Stage IB (cT2, cN0, cM0, G2, ER+, PR+, HER2-) - Signed by Truitt Merle, MD on 09/04/2019 - Pathologic stage from 09/24/2019: Stage IB (pT2, pN1a, cM0, G2, ER+, PR+, HER2-) - Signed by Truitt Merle, MD on 10/10/2019    Malignant neoplasm of upper-outer quadrant of right breast in female, estrogen receptor positive ([place])  08/24/2019 Mammogram   Diagnostic mammogram and [place] 08/24/19 IMPRESSION: Suspicious right breast mass 10 o'clock 1 cm from the nipple measuring 3.3 x 1.9 x 1.9 cm and axillary adenopathy.   08/28/2019 Initial Biopsy   Diagnosis 08/28/19 1. Breast, right, needle core biopsy, 10 o'clock - INVASIVE MAMMARY CARCINOMA, GRADE II. - SEE MICROSCOPIC DESCRIPTION. 2. Lymph node, needle/core biopsy, right axilla - BENIGN LYMPH NODE. - NO METASTATIC CARCINOMA IDENTIFIED.    08/28/2019 Receptors her2   Results: IMMUNOHISTOCHEMICAL AND MORPHOMETRIC ANALYSIS PERFORMED MANUALLY The tumor cells are NEGATIVE for Her2 (1+). Estrogen Receptor: 100%, POSITIVE, STRONG STAINING INTENSITY Progesterone Receptor: 100%, POSITIVE,  STRONG STAINING INTENSITY Proliferation Marker Ki67: 10%   08/28/2019 Cancer Staging   Staging form: Breast, AJCC 8th Edition - Clinical stage from 08/28/2019: Stage IB (cT2, cN0, cM0, G2, ER+, PR+, HER2-) - Signed by Truitt Merle, MD on 09/04/2019   08/31/2019 Initial Diagnosis   Malignant neoplasm of upper-outer quadrant of right breast in female, estrogen receptor positive ([place])   09/07/2019 Breast MRI   Breast MRI 09/07/19  IMPRESSION: 1. 2.5 cm known malignancy in the anterior right breast, superficial depth near the nipple. 2. 7-8 mm indeterminate mass in the central upper outer quadrant of the right breast. 3. One right axillary lymph node with apparent mild cortical thickening, which lies slightly anterior and inferior to the biopsied right axillary lymph node ( benign reactive on pathology). This is most likely also benign given the pathology from the adjacent biopsied lymph node. 4. No evidence of left breast malignancy.   09/17/2019 Imaging   [place] right breast 09/17/19  IMPRESSION: Several benign appearing masses in the upper outer quadrant of the right breast without a definite correlate to the finding seen on prior MRI. The right axillary lymph nodes appear similar to the recently biopsied benign lymph node.   09/20/2019 Pathology Results   Diagnosis 09/20/19  Breast, right, needle core biopsy, upper outer quadrant - FIBROADENOMA - NO MALIGNANCY IDENTIFIED    09/21/2019 Genetic Testing   Negative genetic testing:  No pathogenic variants detected on the [facility] Breast Cancer STAT panel and Common Hereditary Cancers panel.  A variant of uncertain significance was identified in the BRCA1 gene, called c.2048A>G ([id number]).  The report date is 09/21/2019.  The STAT Breast cancer panel offered by [facility] includes sequencing and rearrangement analysis for the following 9 genes:  ATM,  BRCA1, BRCA2, CDH1, CHEK2, PALB2, PTEN, STK11 and TP53.   The Common Hereditary Cancers Panel  offered by [facility] includes sequencing and/or deletion duplication testing of the following 48 genes: APC, ATM, AXIN2, BARD1, BMPR1A, BRCA1, BRCA2, BRIP1, CDH1, CDK4, CDKN2A (p14ARF), CDKN2A (p16INK4a), CHEK2, CTNNA1, DICER1, EPCAM (Deletion/duplication testing only), GREM1 (promoter region deletion/duplication testing only), KIT, MEN1, MLH1, MSH2, MSH3, MSH6, MUTYH, NBN, NF1, NHTL1, PALB2, PDGFRA, PMS2, POLD1, POLE, PTEN, RAD50, RAD51C, RAD51D, RNF43, SDHB, SDHC, SDHD, SMAD4, SMARCA4. STK11, TP53, TSC1, TSC2, and VHL.  The following genes were evaluated for sequence changes only: SDHA and HOXB13 c.251G>A variant only.    09/24/2019 Surgery   RIGHT BREAST LUMPECTOMY WITH SENTINEL LYMPH NODE BIOPSY by Dr Marlou Starks  09/24/19    09/24/2019 Pathology Results   FINAL MICROSCOPIC DIAGNOSIS: 09/24/19   A. LYMPH NODE, RIGHT #1, SENTINEL, BIOPSY:  - There is no evidence of carcinoma in 1 of 1 lymph node (0/1).   B. LYMPH NODE, RIGHT, SENTINEL, BIOPSY:  - There is no evidence of carcinoma in 1 of 1 lymph node (0/1).   C. 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   D. LYMPH NODE, RIGHT #2, SENTINEL, BIOPSY:  - There is no evidence of carcinoma in 1 of 1 lymph node (0/1).   E. LYMPH NODE, RIGHT #3 , SENTINEL, BIOPSY:  - There is no evidence of carcinoma in 1 of 1 lymph node (0/1).   F. LYMPH NODE, RIGHT, SENTINEL, BIOPSY:  - There is no evidence of carcinoma in 1 of 1 lymph node (0/1).   G. LYMPH NODE, RIGHT, SENTINEL, BIOPSY:  -There is no evidence of carcinoma in 1 of 1 lymph node (0/1).   H. 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  - Ductal carcinoma in situ, intermediate grade.  - Perineural invasion is identified.  - The surgical resection margins are negative for carcinoma.  - See oncology table below   I. BREAST, RIGHT ADDITIONAL INFERIOR MARGIN, EXCISION:  - Fibrocystic changes.  - There is no  evidence of malignancy.     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 Postoperative findings in the right breast and axilla. Upper normal sized right axillary lymph nodes, nonspecific. 2. 2 mm superior apical segment right upper lobe nodule is technically nonspecific although statistically likely to be benign. 3. 3.6 cm hypodense right adnexal structure, probably a cyst but technically nonspecific. If patient is premenopausal, no follow up is warranted; if early postmenopausal, follow up ultrasound in 6-12 months would be recommended. 4. Other imaging findings of potential clinical significance: Small type 1 hiatal hernia. Descending and sigmoid colon diverticulosis. Small indirect left inguinal hernia contains adipose tissue.   Aortic Atherosclerosis (ICD10-I70.0).   10/26/2019 Imaging   Whole Body Bone scan  10/26/19  IMPRESSION: Nonspecific sites of uptake at the RIGHT ischium and anterior LEFT sixth rib, cannot exclude metastatic foci.   Otherwise negative exam.     10/29/2019 - 04/04/2020 Chemotherapy   Adjuvant AC q2weeks for 4 cycles starting 10/29/19-12/21/19 followed by weekly Taxol for 12 weeks starting 01/11/20. Taxol reduced 30% starting with C6. Completed on 04/04/20   03/24/2020 Breast MRI   IMPRESSION: 1. Postsurgical changes in the right breast and right  axilla. 2. No MRI evidence of malignancy in either breast.   RECOMMENDATION: Diagnostic bilateral mammogram in September 2021.   BI-RADS CATEGORY  2: Benign.   04/21/2020 - 06/18/2020 Radiation Therapy   adjuvant Radiation in [facility] 04/21/20-06/18/20 with Marguerita Merles   12/2020 -  Anti-estrogen oral therapy   Letrozole 2.70m once daily starting 12/2020     CURRENT THERAPY: Letrozole 2.5 mg once daily starting 12/2020   INTERVAL HISTORY: Ms. PMaestrereturns for follow up as scheduled. She was last seen 04/06/21 by Dr. FBurr Medico Mammogram is scheduled for 08/13/21.  She is doing well physically but is really bothered by her weight gain. Healthy weight loss management is backed up and has seen her yet. She eats healthy and tries to exercise a couple times a week when she can.  She has moderate hot flashes but tolerable.  Denies new bone or joint pain.  Gabapentin is helping CIPN, burning breast pain, and sleep.  Denies changes in her bowel habits, GI/GYN bleeding, recent fever, chest pain, dyspnea, or new concerns.   MEDICAL HISTORY:  Past Medical History:  Diagnosis Date   Anxiety    [place]   right breast cancer-had lumpectomy   Depression    DUB (dysfunctional uterine bleeding)    Family history of pancreatic cancer    Hypertension    Personal history of chemotherapy    Personal history of radiation therapy     SURGICAL HISTORY: Past Surgical History:  Procedure Laterality Date   BREAST LUMPECTOMY     BREAST LUMPECTOMY WITH AXILLARY LYMPH NODE BIOPSY Right 09/24/2019   Procedure: RIGHT BREAST LUMPECTOMY WITH SENTINEL LYMPH NODE BIOPSY;  Surgeon: TJovita Kussmaul MD;  Location: [facility]  Service: General;  Laterality: Right;   BREAST SURGERY  09/24/2019   Breast lump excised   FOOT SURGERY     LAPAROSCOPIC GASTRIC BANDING     PORTACATH PLACEMENT N/A 10/25/2019   Procedure: INSERTION PORT-A-CATH WITH POSSIBLE ULTRASOUND;  Surgeon: TJovita Kussmaul MD;   Location: [facility];  Service: General;  Laterality: N/A;   WISDOM TOOTH EXTRACTION      I have reviewed the social history and family history with the patient and they are unchanged from previous note.  ALLERGIES:  is allergic to hydrochlorothiazide, codeine, and lisinopril.  MEDICATIONS:  Current Outpatient Medications  Medication Sig Dispense Refill   amLODipine (NORVASC) 10 MG tablet Take 10 mg by mouth daily.     Bioflavonoid Products (ESTER C PO) Take 1 tablet by mouth daily.     Calcium Carbonate-Vitamin D (CALCIUM + D PO) Take 1 tablet by mouth daily.      cetirizine (ZYRTEC) 10 MG tablet Take 10 mg by mouth daily as needed for allergies.     cholecalciferol (VITAMIN D) 25 MCG (1000 UT) tablet Take 1,000 Units by mouth daily.     citalopram (CELEXA) 20 MG tablet Take 20 mg by mouth daily.     gabapentin (NEURONTIN) 100 MG capsule Take 3 capsules (300 mg total) by  mouth at bedtime. 90 capsule 1   HYDROcodone-acetaminophen (NORCO/VICODIN) 5-325 MG tablet Take 1-2 tablets by mouth every 6 (six) hours as needed for moderate pain or severe pain. (Patient not taking: Reported on 02/29/2020) 10 tablet 0   letrozole (FEMARA) 2.5 MG tablet Take 1 tablet (2.5 mg total) by mouth daily. 90 tablet 1   LORazepam (ATIVAN) 0.5 MG tablet Take 1-2 tablets (0.5-1 mg total) by mouth every 8 (eight) hours as needed for anxiety (nausea). 20 tablet 0   losartan (COZAAR) 100 MG tablet Take 100 mg by mouth daily.     Multiple Vitamin (MULTIVITAMIN WITH MINERALS) TABS tablet Take 1 tablet by mouth daily.     promethazine (PHENERGAN) 25 MG tablet Take 1 tablet (25 mg total) by mouth every 6 (six) hours as needed for nausea or vomiting. (Patient not taking: Reported on 02/29/2020) 30 tablet 0   traZODone (DESYREL) 100 MG tablet TAKE 1 TABLET(100 MG) BY MOUTH AT BEDTIME AS NEEDED FOR SLEEP 30 tablet 0   No current facility-administered medications for this visit.    PHYSICAL EXAMINATION: ECOG PERFORMANCE STATUS:  1 - Symptomatic but completely ambulatory  Vitals:   08/06/21 0853  BP: (!) 151/93  Pulse: 73  Resp: 18  Temp: 97.6 F (36.4 C)  SpO2: 99%   Filed Weights   08/06/21 0853  Weight: (!) 306 lb 9 oz (139.1 kg)    GENERAL:alert, no distress and comfortable SKIN: no rash  EYES: sclera clear NECK: without mass LYMPH:  no palpable cervical or supraclavicular lymphadenopathy  LUNGS:  normal breathing effort HEART:  no lower extremity edema NEURO: alert & oriented x 3 with fluent speech, no focal motor deficits Breast exam: breasts are symmetric without nipple discharge or inversion. S/p right lumpectomy, incisions completely healed. Mild scar tissue. No palpable mass or nodularity in either breast or axilla that I could appreciate.   LABORATORY DATA:  I have reviewed the data as listed CBC Latest Ref Rng & Units 08/06/2021 04/06/2021 06/27/2020  WBC 4.0 - 10.5 K/uL 5.3 7.2 4.0  Hemoglobin 12.0 - 15.0 g/dL 12.0 12.3 11.3(L)  Hematocrit 36.0 - 46.0 % 37.3 37.9 35.8(L)  Platelets 150 - 400 K/uL 347 390 302     CMP Latest Ref Rng & Units 08/06/2021 04/06/2021 06/27/2020  Glucose 70 - 99 mg/dL 101(H) 88 81  BUN 6 - 20 mg/dL 10 8 8   Creatinine 0.44 - 1.00 mg/dL 0.77 0.76 0.80  Sodium 135 - 145 mmol/L 140 141 140  Potassium 3.5 - 5.1 mmol/L 3.8 3.6 3.6  Chloride 98 - 111 mmol/L 104 104 106  CO2 22 - 32 mmol/L 25 27 24   Calcium 8.9 - 10.3 mg/dL 10.1 10.0 10.4(H)  Total Protein 6.5 - 8.1 g/dL 8.1 8.2(H) 7.5  Total Bilirubin 0.3 - 1.2 mg/dL 0.4 0.3 0.4  Alkaline Phos 38 - 126 U/L 96 91 68  AST 15 - 41 U/L 15 14(L) 17  ALT 0 - 44 U/L 20 19 21       RADIOGRAPHIC STUDIES: I have personally reviewed the radiological images as listed and agreed with the findings in the report. No results found.   ASSESSMENT & PLAN: Paula Davidson is a 50 y.o. female with      1 Malignant neoplasm of upper-outer quadrant of right breast, pT2N1aM0, stage IB, ER+/PR+, HER2-, Grade II -Diagnosed in 08/2019.  S/p right lumpectomy and SLNB with Dr. Marlou Starks on 09/24/19. Her mammaprint showed High Risk Luminal Type B with 29% risk of  recurrence in 10 years.  -To reduce her high risk she completed adjuvant chemo AC-T chemo on 04/04/20 and adjuvant Rad[facility] in [facility] 04/21/20-06/18/20.  -She started antiestrogen therapy with Letrozole from 12/2020. She has tolerated moderately well with weight gain and hot flashes -Ms. Mandarino is clinically doing well. She tolerates letrozole with weight gain and manageable hot flashes. Breast exam is benign. CBC and CMP are stable. Overall there is no clinical concern for recurrence.  -continue surveillance and AI. Next mammogram 08/13/21. Will add on baseline DEXA. -due to her very dense breast tissue cat d, and her future breast cancer risk, she is a candidate for screening breast MRI. Will start 01/2022 -we also discussed age appropriate health maintenance  -next surveillance visit in 4 months  -she still has PAC but has not been flushed in a long time. Will flush today and cc Dr. Marlou Starks to remove it.   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  -improved on gabapentin     3. Genetic Testing negative for pathogenetic mutations -She does have VUS of BRCA1 c.2048A>G      4. HTN, Depression, Anxiety, obesity  -On Amlodipine, Losartan, Lopressor and Celexa -On Letrozole she has gained more weight. She was referred to weight loss clinic but they have not seen her yet. She is very disturbed by the weight gain  -I recommend livestrong program at her [facility], she has access to this at work    5. Anemia  -Has been secondary to menorrhagia in the past, then chemo -resolved   6. Insomnia  -She had trouble sleeping prior to her cancer diagnosis. She previously tried Melatonin and Amitriptyline with little help.   -She has been counseled on drug interaction with Celexa and Trazodone and knows not to take it at the same time given small drug interaction. -improved on trazadone and  gabapentin   PLAN: -Labs reviewed -Mammogram 08/13/21, with DEXA if it can be done same day. Start OTC calcium/vit D -Port flush today, will refer back to Dr. Marlou Starks for removal -Flu vaccine today -Referral to [facility] for first screening colonoscopy (no family h/o CRC) -Pt will try [employer] program at the [facility] for exercise/weight loss -Continue surveillance and letrozole, refilled -Begin screening breast MRI 01/2022, will order at next visit  -Lab, f/up in 4 months    Orders Placed This Encounter  Procedures   DG Bone Density    Standing Status:   Future    Standing Expiration Date:   08/06/2022    Order Specific Question:   Reason for Exam (SYMPTOM  OR DIAGNOSIS REQUIRED)    Answer:   baseline, postmenopausal on AI    Order Specific Question:   Is the patient pregnant?    Answer:   No    Order Specific Question:   Preferred imaging location?    Answer:   [facility]   Ambulatory referral to Gastroenterology    Referral Priority:   Routine    Referral Type:   Consultation    Referral Reason:   Specialty Services Required    Number of Visits Requested:   1   All questions were answered. The patient knows to call the clinic with any problems, questions or concerns. No barriers to learning were detected. Total encounter time was 40 minutes.      Alla Feeling, NP 08/06/21

## 2021-08-06 ENCOUNTER — Inpatient Hospital Stay: Payer: BC Managed Care – PPO

## 2021-08-06 ENCOUNTER — Encounter: Payer: Self-pay | Admitting: Nurse Practitioner

## 2021-08-06 ENCOUNTER — Inpatient Hospital Stay: Payer: BC Managed Care – PPO | Attending: Nurse Practitioner | Admitting: Nurse Practitioner

## 2021-08-06 ENCOUNTER — Other Ambulatory Visit: Payer: Self-pay

## 2021-08-06 VITALS — BP 151/93 | HR 73 | Temp 97.6°F | Resp 18 | Wt 306.6 lb

## 2021-08-06 DIAGNOSIS — K4090 Unilateral inguinal hernia, without obstruction or gangrene, not specified as recurrent: Secondary | ICD-10-CM | POA: Insufficient documentation

## 2021-08-06 DIAGNOSIS — Z79899 Other long term (current) drug therapy: Secondary | ICD-10-CM | POA: Diagnosis not present

## 2021-08-06 DIAGNOSIS — K5730 Diverticulosis of large intestine without perforation or abscess without bleeding: Secondary | ICD-10-CM | POA: Insufficient documentation

## 2021-08-06 DIAGNOSIS — I10 Essential (primary) hypertension: Secondary | ICD-10-CM | POA: Insufficient documentation

## 2021-08-06 DIAGNOSIS — R911 Solitary pulmonary nodule: Secondary | ICD-10-CM | POA: Insufficient documentation

## 2021-08-06 DIAGNOSIS — G620 Drug-induced polyneuropathy: Secondary | ICD-10-CM | POA: Diagnosis not present

## 2021-08-06 DIAGNOSIS — C50411 Malignant neoplasm of upper-outer quadrant of right female breast: Secondary | ICD-10-CM | POA: Insufficient documentation

## 2021-08-06 DIAGNOSIS — Z9221 Personal history of antineoplastic chemotherapy: Secondary | ICD-10-CM | POA: Diagnosis not present

## 2021-08-06 DIAGNOSIS — I700 Atherosclerosis of aorta: Secondary | ICD-10-CM | POA: Diagnosis not present

## 2021-08-06 DIAGNOSIS — Z923 Personal history of irradiation: Secondary | ICD-10-CM | POA: Insufficient documentation

## 2021-08-06 DIAGNOSIS — Z79811 Long term (current) use of aromatase inhibitors: Secondary | ICD-10-CM | POA: Diagnosis not present

## 2021-08-06 DIAGNOSIS — Z23 Encounter for immunization: Secondary | ICD-10-CM

## 2021-08-06 DIAGNOSIS — Z170 Estrogen receptor positive status [ER+]: Secondary | ICD-10-CM | POA: Diagnosis not present

## 2021-08-06 DIAGNOSIS — Z452 Encounter for adjustment and management of vascular access device: Secondary | ICD-10-CM | POA: Diagnosis not present

## 2021-08-06 DIAGNOSIS — T451X5A Adverse effect of antineoplastic and immunosuppressive drugs, initial encounter: Secondary | ICD-10-CM | POA: Diagnosis not present

## 2021-08-06 DIAGNOSIS — K449 Diaphragmatic hernia without obstruction or gangrene: Secondary | ICD-10-CM | POA: Insufficient documentation

## 2021-08-06 DIAGNOSIS — R232 Flushing: Secondary | ICD-10-CM | POA: Insufficient documentation

## 2021-08-06 DIAGNOSIS — Z1211 Encounter for screening for malignant neoplasm of colon: Secondary | ICD-10-CM

## 2021-08-06 DIAGNOSIS — Z95828 Presence of other vascular implants and grafts: Secondary | ICD-10-CM

## 2021-08-06 LAB — CBC WITH DIFFERENTIAL (CANCER CENTER ONLY)
Abs Immature Granulocytes: 0.01 10*3/uL (ref 0.00–0.07)
Basophils Absolute: 0 10*3/uL (ref 0.0–0.1)
Basophils Relative: 1 %
Eosinophils Absolute: 0.4 10*3/uL (ref 0.0–0.5)
Eosinophils Relative: 8 %
HCT: 37.3 % (ref 36.0–46.0)
Hemoglobin: 12 g/dL (ref 12.0–15.0)
Immature Granulocytes: 0 %
Lymphocytes Relative: 25 %
Lymphs Abs: 1.3 10*3/uL (ref 0.7–4.0)
MCH: 25.6 pg — ABNORMAL LOW (ref 26.0–34.0)
MCHC: 32.2 g/dL (ref 30.0–36.0)
MCV: 79.5 fL — ABNORMAL LOW (ref 80.0–100.0)
Monocytes Absolute: 0.5 10*3/uL (ref 0.1–1.0)
Monocytes Relative: 10 %
Neutro Abs: 3 10*3/uL (ref 1.7–7.7)
Neutrophils Relative %: 56 %
Platelet Count: 347 10*3/uL (ref 150–400)
RBC: 4.69 MIL/uL (ref 3.87–5.11)
RDW: 15.8 % — ABNORMAL HIGH (ref 11.5–15.5)
WBC Count: 5.3 10*3/uL (ref 4.0–10.5)
nRBC: 0 % (ref 0.0–0.2)

## 2021-08-06 LAB — CMP (CANCER CENTER ONLY)
ALT: 20 U/L (ref 0–44)
AST: 15 U/L (ref 15–41)
Albumin: 3.8 g/dL (ref 3.5–5.0)
Alkaline Phosphatase: 96 U/L (ref 38–126)
Anion gap: 11 (ref 5–15)
BUN: 10 mg/dL (ref 6–20)
CO2: 25 mmol/L (ref 22–32)
Calcium: 10.1 mg/dL (ref 8.9–10.3)
Chloride: 104 mmol/L (ref 98–111)
Creatinine: 0.77 mg/dL (ref 0.44–1.00)
GFR, Estimated: >60 mL/min (ref >60)
Glucose, Bld: 101 mg/dL — ABNORMAL HIGH (ref 70–99)
Potassium: 3.8 mmol/L (ref 3.5–5.1)
Sodium: 140 mmol/L (ref 135–145)
Total Bilirubin: 0.4 mg/dL (ref 0.3–1.2)
Total Protein: 8.1 g/dL (ref 6.5–8.1)

## 2021-08-06 MED ORDER — SODIUM CHLORIDE 0.9% FLUSH
10.0000 mL | Freq: Once | INTRAVENOUS | Status: AC
  Administered 2021-08-06: 10 mL

## 2021-08-06 MED ORDER — HEPARIN SOD (PORK) LOCK FLUSH 100 UNIT/ML IV SOLN
500.0000 [IU] | Freq: Once | INTRAVENOUS | Status: AC
  Administered 2021-08-06: 500 [IU]

## 2021-08-06 MED ORDER — INFLUENZA VAC SPLIT QUAD 0.5 ML IM SUSY: 0.5000 mL | PREFILLED_SYRINGE | Freq: Once | INTRAMUSCULAR | Status: DC

## 2021-08-06 MED ORDER — LETROZOLE 2.5 MG PO TABS: 2.5000 mg | ORAL_TABLET | Freq: Every day | ORAL | 3 refills | Status: DC

## 2021-08-06 MED ORDER — INFLUENZA VAC SPLIT QUAD 0.5 ML IM SUSY
0.5000 mL | PREFILLED_SYRINGE | Freq: Once | INTRAMUSCULAR | Status: AC
  Administered 2021-08-06: 0.5 mL via INTRAMUSCULAR
  Filled 2021-08-06: qty 0.5

## 2021-08-10 ENCOUNTER — Telehealth: Payer: Self-pay | Admitting: Nurse Practitioner

## 2021-08-10 NOTE — Telephone Encounter (Signed)
Scheduled appt per 9/22 los. Called pt, no answer. Left msg with appt date and time.

## 2021-08-14 ENCOUNTER — Other Ambulatory Visit: Payer: BC Managed Care – PPO

## 2021-08-17 ENCOUNTER — Telehealth: Payer: Self-pay

## 2021-08-17 NOTE — Telephone Encounter (Signed)
Referral faxed to [facility] per Natasha Bence, NP, request.  Also contacted Dr Ethlyn Gallery office to see if he had a preference on who removed patient's PAC ([facility] or [facility]), nurse reported that it was patient directed. Will alert provider.

## 2021-08-18 ENCOUNTER — Other Ambulatory Visit: Payer: Self-pay | Admitting: Nurse Practitioner

## 2021-08-18 DIAGNOSIS — C50411 Malignant neoplasm of upper-outer quadrant of right female breast: Secondary | ICD-10-CM

## 2021-08-18 DIAGNOSIS — Z170 Estrogen receptor positive status [ER+]: Secondary | ICD-10-CM

## 2021-09-03 ENCOUNTER — Other Ambulatory Visit: Payer: Self-pay | Admitting: Hematology

## 2021-09-06 ENCOUNTER — Encounter: Payer: Self-pay | Admitting: Hematology

## 2021-09-10 ENCOUNTER — Other Ambulatory Visit: Payer: BC Managed Care – PPO

## 2021-09-13 ENCOUNTER — Other Ambulatory Visit: Payer: Self-pay | Admitting: Radiology

## 2021-09-15 ENCOUNTER — Other Ambulatory Visit: Payer: Self-pay

## 2021-09-15 ENCOUNTER — Ambulatory Visit (HOSPITAL_COMMUNITY)
Admission: RE | Admit: 2021-09-15 | Discharge: 2021-09-15 | Disposition: A | Discharge status: Home / Self Care | Payer: BC Managed Care – PPO | Source: Ambulatory Visit | Attending: Nurse Practitioner | Admitting: Nurse Practitioner

## 2021-09-15 DIAGNOSIS — Z885 Allergy status to narcotic agent status: Secondary | ICD-10-CM | POA: Diagnosis not present

## 2021-09-15 DIAGNOSIS — Z9221 Personal history of antineoplastic chemotherapy: Secondary | ICD-10-CM | POA: Insufficient documentation

## 2021-09-15 DIAGNOSIS — Z888 Allergy status to other drugs, medicaments and biological substances status: Secondary | ICD-10-CM | POA: Diagnosis not present

## 2021-09-15 DIAGNOSIS — Z452 Encounter for adjustment and management of vascular access device: Secondary | ICD-10-CM | POA: Diagnosis not present

## 2021-09-15 DIAGNOSIS — C50411 Malignant neoplasm of upper-outer quadrant of right female breast: Secondary | ICD-10-CM | POA: Diagnosis not present

## 2021-09-15 DIAGNOSIS — Z170 Estrogen receptor positive status [ER+]: Secondary | ICD-10-CM | POA: Insufficient documentation

## 2021-09-15 DIAGNOSIS — Z87891 Personal history of nicotine dependence: Secondary | ICD-10-CM | POA: Insufficient documentation

## 2021-09-15 DIAGNOSIS — Z79899 Other long term (current) drug therapy: Secondary | ICD-10-CM | POA: Diagnosis not present

## 2021-09-15 HISTORY — PX: IR REMOVAL TUN ACCESS W/ PORT W/O FL MOD SED: IMG2290

## 2021-09-15 MED ORDER — MIDAZOLAM HCL 2 MG/2ML IJ SOLN
INTRAMUSCULAR | Status: AC
  Filled 2021-09-15: qty 4

## 2021-09-15 MED ORDER — MIDAZOLAM HCL 2 MG/2ML IJ SOLN
INTRAMUSCULAR | Status: AC | PRN
  Administered 2021-09-15: 1 mg via INTRAVENOUS

## 2021-09-15 MED ORDER — LIDOCAINE-EPINEPHRINE 1 %-1:100000 IJ SOLN
INTRAMUSCULAR | Status: AC
  Filled 2021-09-15: qty 1

## 2021-09-15 MED ORDER — LIDOCAINE HCL 1 % IJ SOLN
INTRAMUSCULAR | Status: AC
  Filled 2021-09-15: qty 20

## 2021-09-15 MED ORDER — FENTANYL CITRATE (PF) 100 MCG/2ML IJ SOLN
INTRAMUSCULAR | Status: AC
  Filled 2021-09-15: qty 2

## 2021-09-15 MED ORDER — FENTANYL CITRATE (PF) 100 MCG/2ML IJ SOLN
INTRAMUSCULAR | Status: AC | PRN
  Administered 2021-09-15: 50 ug via INTRAVENOUS

## 2021-09-15 MED ORDER — SODIUM CHLORIDE 0.9 % IV SOLN: INTRAVENOUS | Status: DC

## 2021-09-15 MED ORDER — LIDOCAINE-EPINEPHRINE 1 %-1:100000 IJ SOLN
INTRAMUSCULAR | Status: AC | PRN
  Administered 2021-09-15: 10 mL via INTRADERMAL

## 2021-09-15 NOTE — Consult Note (Signed)
Chief Complaint: Patient was seen in consultation today for Port-A-Cath removal  Referring Physician(s): Burton,Lacie K/Feng,Y  Supervising Physician: Sandi Mariscal  Patient Status: [facility] - Out-pt  History of Present Illness: Paula Davidson is a 50 y.o. female with history of right breast cancer diagnosed in October 2020 and left chest wall Port-A-Cath placed by [facility] on 10/25/19.  She has now completed chemotherapy and is no longer using her Port-A-Cath.  She presents today for Port-A-Cath removal.  Past Medical History:  Diagnosis Date   Anxiety    Cancer ([place]) 09/24/2019   right breast cancer-had lumpectomy   Depression    DUB (dysfunctional uterine bleeding)    Family history of pancreatic cancer    Hypertension    Personal history of chemotherapy    Personal history of radiation therapy     Past Surgical History:  Procedure Laterality Date   BREAST LUMPECTOMY     BREAST LUMPECTOMY WITH AXILLARY LYMPH NODE BIOPSY Right 09/24/2019   Procedure: RIGHT BREAST LUMPECTOMY WITH SENTINEL LYMPH NODE BIOPSY;  Surgeon: Jovita Kussmaul, MD;  Location: [facility];  Service: General;  Laterality: Right;   BREAST SURGERY  09/24/2019   Breast lump excised   FOOT SURGERY     LAPAROSCOPIC GASTRIC BANDING     PORTACATH PLACEMENT N/A 10/25/2019   Procedure: INSERTION PORT-A-CATH WITH POSSIBLE ULTRASOUND;  Surgeon: Jovita Kussmaul, MD;  Location: [facility];  Service: General;  Laterality: N/A;   WISDOM TOOTH EXTRACTION      Allergies: Hydrochlorothiazide, Codeine, and Lisinopril  Medications: Prior to Admission medications   Medication Sig Start Date End Date Taking? Authorizing Provider  amLODipine (NORVASC) 10 MG tablet Take 10 mg by mouth daily. 02/12/20  Yes [provider]  cetirizine (ZYRTEC) 10 MG tablet Take 10 mg by mouth daily as needed for allergies.   Yes [provider]  citalopram (CELEXA) 20 MG tablet Take 20 mg by mouth daily.   Yes [provider]  gabapentin (NEURONTIN) 100 MG capsule Take 3 capsules (300 mg total) by mouth at bedtime. 04/14/21  Yes Alla Feeling, NP  letrozole [facility]) 2.5 MG tablet Take 1 tablet (2.5 mg total) by mouth daily. 08/06/21  Yes Alla Feeling, NP  losartan (COZAAR) 100 MG tablet Take 100 mg by mouth daily.   Yes [provider]  Multiple Vitamin (MULTIVITAMIN WITH MINERALS) TABS tablet Take 1 tablet by mouth daily.   Yes [provider]  traZODone (DESYREL) 100 MG tablet TAKE 1 TABLET(100 MG) BY MOUTH AT BEDTIME 09/06/21  Yes Truitt Merle, MD  Bioflavonoid Products (ESTER C PO) Take 1 tablet by mouth daily.    [provider]  Calcium Carbonate-Vitamin D (CALCIUM + D PO) Take 1 tablet by mouth daily.     [provider]  cholecalciferol (VITAMIN D) 25 MCG (1000 UT) tablet Take 1,000 Units by mouth daily.    [provider]  HYDROcodone-acetaminophen (NORCO/VICODIN) 5-325 MG tablet Take 1-2 tablets by mouth every 6 (six) hours as needed for moderate pain or severe pain. Patient not taking: Reported on 02/29/2020 10/25/19   Autumn Messing III, MD  LORazepam (ATIVAN) 0.5 MG tablet Take 1-2 tablets (0.5-1 mg total) by mouth every 8 (eight) hours as needed for anxiety (nausea). 12/12/19   Truitt Merle, MD  promethazine (PHENERGAN) 25 MG tablet Take 1 tablet (25 mg total) by mouth every 6 (six) hours as needed for nausea or vomiting. Patient not taking: Reported on 02/29/2020 12/12/19  Truitt Merle, MD  prochlorperazine (COMPAZINE) 10 MG tablet Take 1 tablet (10 mg total) by mouth every 6 (six) hours as needed (Nausea or vomiting). 10/10/19 06/27/20  Truitt Merle, MD     Family History  Problem Relation Age of Onset   Hypertension Mother    Stroke Mother    Hypertension Father    Cerebral palsy Brother    Pancreatic cancer Maternal Grandmother 82   Cirrhosis Maternal Grandfather    Heart attack Paternal Grandfather     Social History   Socioeconomic History    Marital status: Single    Spouse name: Not on file   Number of children: Not on file   Years of education: Not on file   Highest education level: Not on file  Occupational History   Occupation: operation Freight forwarder   Tobacco Use   Smoking status: Former    Years: 5.00    Types: Cigarettes    Quit date: 05/29/1997    Years since quitting: 24.3   Smokeless tobacco: Never  Vaping Use   Vaping Use: Never used  Substance and Sexual Activity   Alcohol use: Yes    Comment: OCC GLASS OF WINE   Drug use: No   Sexual activity: Yes  Other Topics Concern   Not on file  Social History Narrative   Not on file   Social Determinants of Health   Financial Resource Strain: Not on file  Food Insecurity: Not on file  Transportation Needs: Not on file  Physical Activity: Not on file  Stress: Not on file  Social Connections: Not on file      Review of Systems denies fever, headache, chest pain, dyspnea, cough, abdominal/back pain, nausea, vomiting or bleeding.  Vital Signs: BP (!) 157/99   Pulse 81   Temp 97.9 F (36.6 C) (Oral)   Resp 16   Ht 5\' 6"  (1.676 m)   Wt 297 lb (134.7 kg)   LMP  (LMP Unknown) Comment: postmenapos  SpO2 100%   BMI 47.94 kg/m   Physical Exam awake, alert.  Chest clear to auscultation bilaterally.  Clean, intact left upper chest wall Port-A-Cath.  Heart with regular rate and rhythm.  Abdomen obese, soft, positive bowel sounds, nontender.  No lower extremity edema.  Imaging: No results found.  Labs:  CBC: Recent Labs    04/06/21 0805 08/06/21 0828  WBC 7.2 5.3  HGB 12.3 12.0  HCT 37.9 37.3  PLT 390 347    COAGS: No results for input(s): INR, APTT in the last 8760 hours.  BMP: Recent Labs    04/06/21 0805 08/06/21 0828  NA 141 140  K 3.6 3.8  CL 104 104  CO2 27 25  GLUCOSE 88 101*  BUN 8 10  CALCIUM 10.0 10.1  CREATININE 0.76 0.77  GFRNONAA >60 >60    LIVER FUNCTION TESTS: Recent Labs    04/06/21 0805 08/06/21 0828  BILITOT  0.3 0.4  AST 14* 15  ALT 19 20  ALKPHOS 91 96  PROT 8.2* 8.1  ALBUMIN 3.7 3.8    TUMOR MARKERS: No results for input(s): AFPTM, CEA, CA199, CHROMGRNA in the last 8760 hours.  Assessment and Plan: 50 y.o. female with history of right breast cancer diagnosed in October 2020 and left chest wall Port-A-Cath placed by [facility] on 10/25/19.  She has now completed chemotherapy and is no longer using her Port-A-Cath.  She presents today for Port-A-Cath removal.Risks and benefits of image guided port-a-catheter removal was discussed with the  patient including, but not limited to bleeding, infection, or injury to adjacent structures.  All of the patient's questions were answered, patient is agreeable to proceed. Consent signed and in chart.    Thank you for this interesting consult.  I greatly enjoyed meeting Paula Davidson and look forward to participating in their care.  A copy of this report was sent to the requesting provider on this date.  Electronically Signed: D. Rowe Robert, PA-C 09/15/2021, 1:45 PM   I spent a total of   20 minutes  in face to face in clinical consultation, greater than 50% of which was counseling/coordinating care for Port-A-Cath removal

## 2021-09-15 NOTE — Procedures (Signed)
Pre Procedural Dx: Poor venous access Post Procedural Dx: Same  Successful removal of anterior chest wall port-a-cath.  EBL: Minimal  No immediate post procedural complications.   Jay Spero Gunnels, MD Pager #: [phone]   

## 2021-09-15 NOTE — Discharge Instructions (Addendum)
For questions /concerns may call Interventional Radiology at [phone]  You may remove your dressing and shower tomorrow afternoon    Moderate Conscious Sedation, Adult, Care After This sheet gives you information about how to care for yourself after your procedure. Your health care provider may also give you more specific instructions. If you have problems or questions, contact your health careprovider. What can I expect after the procedure? After the procedure, it is common to have: Sleepiness for several hours. Impaired judgment for several hours. Difficulty with balance. Vomiting if you eat too soon. Follow these instructions at home: For the time period you were told by your health care provider: Rest. Do not participate in activities where you could fall or become injured. Do not drive or use machinery. Do not drink alcohol. Do not take sleeping pills or medicines that cause drowsiness. Do not make important decisions or sign legal documents. Do not take care of children on your own. Eating and drinking  Follow the diet recommended by your health care provider. Drink enough fluid to keep your urine pale yellow. If you vomit: Drink water, juice, or soup when you can drink without vomiting. Make sure you have little or no nausea before eating solid foods.  General instructions Take over-the-counter and prescription medicines only as told by your health care provider. Have a responsible adult stay with you for the time you are told. It is important to have someone help care for you until you are awake and alert. Do not smoke. Keep all follow-up visits as told by your health care provider. This is important. Contact a health care provider if: You are still sleepy or having trouble with balance after 24 hours. You feel light-headed. You keep feeling nauseous or you keep vomiting. You develop a rash. You have a fever. You have redness or swelling around the IV site. Get  help right away if: You have trouble breathing. You have new-onset confusion at home. Summary After the procedure, it is common to feel sleepy, have impaired judgment, or feel nauseous if you eat too soon. Rest after you get home. Know the things you should not do after the procedure. Follow the diet recommended by your health care provider and drink enough fluid to keep your urine pale yellow. Get help right away if you have trouble breathing or new-onset confusion at home. This information is not intended to replace advice given to you by your health care provider. Make sure you discuss any questions you have with your healthcare provider.    Implanted Port Removal, Care After This sheet gives you information about how to care for yourself after your procedure. Your health care provider may also give you more specific instructions. If you have problems or questions, contact your health careprovider. What can I expect after the procedure? After the procedure, it is common to have: Soreness or pain near your incision. Some swelling or bruising near your incision. Follow these instructions at home: Medicines Take over-the-counter and prescription medicines only as told by your health care provider. If you were prescribed an antibiotic medicine, take it as told by your health care provider. Do not stop taking the antibiotic even if you start to feel better. Bathing Do not take baths, swim, or use a hot tub until your health care provider approves. Ask your health care provider if you can take showers. You may only be allowed to take sponge baths. Incision care Follow instructions from your health care provider about how to   take care of your incision. Make sure you: Wash your hands with soap and water before you change your bandage (dressing). If soap and water are not available, use hand sanitizer. Change your dressing as told by your health care provider. Keep your dressing dry. Leave   skin glue, or adhesive strips in place. These skin closures may need to stay in place for 2 weeks or longer. If adhesive strip edges start to loosen and curl up, you may trim the loose edges.  Check your incision area every day for signs of infection. Check for:       - More redness, swelling, or pain.       - More fluid or blood.       - Warmth.       - Pus or a bad smell.  Driving Do not drive for 24 hours if you were given a medicine to help you relax (sedative) during your procedure. If you did not receive a sedative, ask your health care provider when it is safe to drive.  Activity Return to your normal activities as told by your health care provider. Ask your health care provider what activities are safe for you. Do not lift anything that is heavier than 10 lb (4.5 kg), or the limit that you are told, until your health care provider says that it is safe. Do not do activities that involve lifting your arms over your head. General instructions Do not use any products that contain nicotine or tobacco, such as cigarettes and e-cigarettes. These can delay healing. If you need help quitting, ask your health care provider. Keep all follow-up visits as told by your health care provider. This is important. Contact a health care provider if: You have more redness, swelling, or pain around your incision. You have more fluid or blood coming from your incision. Your incision feels warm to the touch. You have pus or a bad smell coming from your incision. You have pain that is not relieved by your pain medicine. Get help right away if you have: A fever or chills. Chest pain. Difficulty breathing. Summary After the procedure, it is common to have pain, soreness, swelling, or bruising near your incision. If you were prescribed an antibiotic medicine, take it as told by your health care provider. Do not stop taking the antibiotic even if you start to feel better. Do not drive for 24 hours if you  were given a sedative during your procedure. Return to your normal activities as told by your health care provider. Ask your health care provider what activities are safe for you. This information is not intended to replace advice given to you by your health care provider. Make sure you discuss any questions you have with your healthcare provider. Document Revised: 12/15/2017 Document Reviewed: 12/15/2017 Elsevier Patient Education  2022 [employer]  

## 2021-10-01 ENCOUNTER — Other Ambulatory Visit: Payer: Self-pay

## 2021-10-01 ENCOUNTER — Ambulatory Visit
Admission: RE | Admit: 2021-10-01 | Discharge: 2021-10-01 | Disposition: A | Discharge status: Home / Self Care | Payer: BC Managed Care – PPO | Source: Ambulatory Visit | Attending: Nurse Practitioner | Admitting: Nurse Practitioner

## 2021-10-01 ENCOUNTER — Inpatient Hospital Stay: Admission: RE | Admit: 2021-10-01 | Payer: Self-pay | Source: Ambulatory Visit

## 2021-10-01 DIAGNOSIS — Z170 Estrogen receptor positive status [ER+]: Secondary | ICD-10-CM

## 2021-10-01 DIAGNOSIS — C50411 Malignant neoplasm of upper-outer quadrant of right female breast: Secondary | ICD-10-CM

## 2021-10-05 ENCOUNTER — Telehealth: Payer: Self-pay | Admitting: *Deleted

## 2021-10-05 NOTE — Telephone Encounter (Signed)
Per Cira Rue, NP, called and left message on pt vm with message below. Advised to call office with any concerns.

## 2021-10-05 NOTE — Telephone Encounter (Signed)
-----   Message from Alla Feeling, NP sent at 10/04/2021 12:56 PM EST ----- Please let pt know she has normal bone density. Continue calcium/vit D and weight bearing.  Thanks, Regan Rakers, NP

## 2021-10-06 ENCOUNTER — Encounter: Payer: Self-pay | Admitting: Nurse Practitioner

## 2021-10-06 ENCOUNTER — Other Ambulatory Visit: Payer: Self-pay | Admitting: Hematology

## 2021-10-07 ENCOUNTER — Other Ambulatory Visit: Payer: Self-pay | Admitting: Nurse Practitioner

## 2021-10-07 MED ORDER — GABAPENTIN 300 MG PO CAPS: 300.0000 mg | ORAL_CAPSULE | Freq: Every day | ORAL | 0 refills | Status: DC

## 2021-10-07 MED ORDER — TRAZODONE HCL 100 MG PO TABS: 100.0000 mg | ORAL_TABLET | Freq: Every day | ORAL | 0 refills | Status: DC

## 2021-10-07 MED ORDER — LETROZOLE 2.5 MG PO TABS: 2.5000 mg | ORAL_TABLET | Freq: Every day | ORAL | 0 refills | Status: DC

## 2021-11-19 ENCOUNTER — Encounter: Payer: Self-pay | Admitting: Hematology

## 2021-11-19 ENCOUNTER — Ambulatory Visit
Admission: RE | Admit: 2021-11-19 | Discharge: 2021-11-19 | Disposition: A | Discharge status: Home / Self Care | Payer: BC Managed Care – PPO | Source: Ambulatory Visit | Attending: Hematology | Admitting: Hematology

## 2021-11-19 DIAGNOSIS — Z9889 Other specified postprocedural states: Secondary | ICD-10-CM

## 2021-12-10 ENCOUNTER — Other Ambulatory Visit: Payer: Self-pay | Admitting: Hematology

## 2021-12-14 ENCOUNTER — Inpatient Hospital Stay: Payer: BC Managed Care – PPO | Admitting: Nurse Practitioner

## 2021-12-14 ENCOUNTER — Inpatient Hospital Stay: Payer: BC Managed Care – PPO | Attending: Nurse Practitioner

## 2021-12-14 NOTE — Progress Notes (Deleted)
[facility]   [phone] [phone]) [phone]   Clinic Follow up Note   Patient Care Team: Burnett Sheng, MD as PCP - General (Family Medicine) Mauro Kaufmann, RN as Oncology Nurse Navigator Rockwell Germany, RN as Oncology Nurse Navigator Jovita Kussmaul, MD as Consulting Physician (General Surgery) Truitt Merle, MD as Consulting Physician (Hematology) Eppie Gibson, MD as Attending Physician (Radiation Oncology) Leanna Sato, MD as Referring Physician Paula Feeling, NP as Nurse Practitioner (Nurse Practitioner) 12/14/2021  CHIEF COMPLAINT: Follow up right breast cancer   SUMMARY OF ONCOLOGIC HISTORY: Oncology History Overview Note  Cancer Staging Malignant neoplasm of upper-outer quadrant of right breast in female, estrogen receptor positive ([place]) Staging form: Breast, AJCC 8th Edition - Clinical stage from 08/28/2019: Stage IB (cT2, cN0, cM0, G2, ER+, PR+, HER2-) - Signed by Truitt Merle, MD on 09/04/2019 - Pathologic stage from 09/24/2019: Stage IB (pT2, pN1a, cM0, G2, ER+, PR+, HER2-) - Signed by Truitt Merle, MD on 10/10/2019    Malignant neoplasm of upper-outer quadrant of right breast in female, estrogen receptor positive ([place])  08/24/2019 Mammogram   Diagnostic mammogram and [place] 08/24/19 IMPRESSION: Suspicious right breast mass 10 o'clock 1 cm from the nipple measuring 3.3 x 1.9 x 1.9 cm and axillary adenopathy.   08/28/2019 Initial Biopsy   Diagnosis 08/28/19 1. Breast, right, needle core biopsy, 10 o'clock - INVASIVE MAMMARY CARCINOMA, GRADE II. - SEE MICROSCOPIC DESCRIPTION. 2. Lymph node, needle/core biopsy, right axilla - BENIGN LYMPH NODE. - NO METASTATIC CARCINOMA IDENTIFIED.    08/28/2019 Receptors her2   Results: IMMUNOHISTOCHEMICAL AND MORPHOMETRIC ANALYSIS PERFORMED MANUALLY The tumor cells are NEGATIVE for Her2 (1+). Estrogen Receptor: 100%, POSITIVE, STRONG STAINING INTENSITY Progesterone Receptor: 100%, POSITIVE,  STRONG STAINING INTENSITY Proliferation Marker Ki67: 10%   08/28/2019 Cancer Staging   Staging form: Breast, AJCC 8th Edition - Clinical stage from 08/28/2019: Stage IB (cT2, cN0, cM0, G2, ER+, PR+, HER2-) - Signed by Truitt Merle, MD on 09/04/2019    08/31/2019 Initial Diagnosis   Malignant neoplasm of upper-outer quadrant of right breast in female, estrogen receptor positive ([place])   09/07/2019 Breast MRI   Breast MRI 09/07/19  IMPRESSION: 1. 2.5 cm known malignancy in the anterior right breast, superficial depth near the nipple. 2. 7-8 mm indeterminate mass in the central upper outer quadrant of the right breast. 3. One right axillary lymph node with apparent mild cortical thickening, which lies slightly anterior and inferior to the biopsied right axillary lymph node ( benign reactive on pathology). This is most likely also benign given the pathology from the adjacent biopsied lymph node. 4. No evidence of left breast malignancy.   09/17/2019 Imaging   [place] right breast 09/17/19  IMPRESSION: Several benign appearing masses in the upper outer quadrant of the right breast without a definite correlate to the finding seen on prior MRI. The right axillary lymph nodes appear similar to the recently biopsied benign lymph node.   09/20/2019 Pathology Results   Diagnosis 09/20/19  Breast, right, needle core biopsy, upper outer quadrant - FIBROADENOMA - NO MALIGNANCY IDENTIFIED    09/21/2019 Genetic Testing   Negative genetic testing:  No pathogenic variants detected on the [facility] Breast Cancer STAT panel and Common Hereditary Cancers panel.  A variant of uncertain significance was identified in the BRCA1 gene, called c.2048A>G (Q.[id number]).  The report date is 09/21/2019.  The STAT Breast cancer panel offered by [facility] includes sequencing and rearrangement analysis for the following 9 genes:  ATM, BRCA1, BRCA2, CDH1, CHEK2, PALB2, PTEN, STK11 and TP53.   The Common Hereditary Cancers  Panel offered by [facility] includes sequencing and/or deletion duplication testing of the following 48 genes: APC, ATM, AXIN2, BARD1, BMPR1A, BRCA1, BRCA2, BRIP1, CDH1, CDK4, CDKN2A (p14ARF), CDKN2A (p16INK4a), CHEK2, CTNNA1, DICER1, EPCAM (Deletion/duplication testing only), GREM1 (promoter region deletion/duplication testing only), KIT, MEN1, MLH1, MSH2, MSH3, MSH6, MUTYH, NBN, NF1, NHTL1, PALB2, PDGFRA, PMS2, POLD1, POLE, PTEN, RAD50, RAD51C, RAD51D, RNF43, SDHB, SDHC, SDHD, SMAD4, SMARCA4. STK11, TP53, TSC1, TSC2, and VHL.  The following genes were evaluated for sequence changes only: SDHA and HOXB13 c.251G>A variant only.    09/24/2019 Surgery   RIGHT BREAST LUMPECTOMY WITH SENTINEL LYMPH NODE BIOPSY by Dr Marlou Starks  09/24/19    09/24/2019 Pathology Results   FINAL MICROSCOPIC DIAGNOSIS: 09/24/19   A. LYMPH NODE, RIGHT #1, SENTINEL, BIOPSY:  - There is no evidence of carcinoma in 1 of 1 lymph node (0/1).   B. LYMPH NODE, RIGHT, SENTINEL, BIOPSY:  - There is no evidence of carcinoma in 1 of 1 lymph node (0/1).   C. 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   D. LYMPH NODE, RIGHT #2, SENTINEL, BIOPSY:  - There is no evidence of carcinoma in 1 of 1 lymph node (0/1).   E. LYMPH NODE, RIGHT #3 , SENTINEL, BIOPSY:  - There is no evidence of carcinoma in 1 of 1 lymph node (0/1).   F. LYMPH NODE, RIGHT, SENTINEL, BIOPSY:  - There is no evidence of carcinoma in 1 of 1 lymph node (0/1).   G. LYMPH NODE, RIGHT, SENTINEL, BIOPSY:  -There is no evidence of carcinoma in 1 of 1 lymph node (0/1).   H. 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  - Ductal carcinoma in situ, intermediate grade.  - Perineural invasion is identified.  - The surgical resection margins are negative for carcinoma.  - See oncology table below   I. BREAST, RIGHT ADDITIONAL INFERIOR MARGIN, EXCISION:  - Fibrocystic changes.  - There is  no evidence of malignancy.     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 Postoperative findings in the right breast and axilla. Upper normal sized right axillary lymph nodes, nonspecific. 2. 2 mm superior apical segment right upper lobe nodule is technically nonspecific although statistically likely to be benign. 3. 3.6 cm hypodense right adnexal structure, probably a cyst but technically nonspecific. If patient is premenopausal, no follow up is warranted; if early postmenopausal, follow up ultrasound in 6-12 months would be recommended. 4. Other imaging findings of potential clinical significance: Small type 1 hiatal hernia. Descending and sigmoid colon diverticulosis. Small indirect left inguinal hernia contains adipose tissue.   Aortic Atherosclerosis (ICD10-I70.0).   10/26/2019 Imaging   Whole Body  Bone scan 10/26/19  IMPRESSION: Nonspecific sites of uptake at the RIGHT ischium and anterior LEFT sixth rib, cannot exclude metastatic foci.   Otherwise negative exam.     10/29/2019 - 04/04/2020 Chemotherapy   Adjuvant AC q2weeks for 4 cycles starting 10/29/19-12/21/19 followed by weekly Taxol for 12 weeks starting 01/11/20. Taxol reduced 30% starting with C6. Completed on 04/04/20   03/24/2020 Breast MRI   IMPRESSION: 1. Postsurgical changes in the right breast and  right axilla. 2. No MRI evidence of malignancy in either breast.   RECOMMENDATION: Diagnostic bilateral mammogram in September 2021.   BI-RADS CATEGORY  2: Benign.   04/21/2020 - 06/18/2020 Radiation Therapy   adjuvant Radiation in [facility] 04/21/20-06/18/20 with Marguerita Merles   12/2020 -  Anti-estrogen oral therapy   Letrozole 2.1m once daily starting 12/2020     CURRENT THERAPY: Letrozole 2.5 mg, once daily started 12/2020  INTERVAL HISTORY: Ms. PRaynerreturns for follow up as scheduled, last seen by me 08/06/21. She continues letrozole. Mammogram 11/19/20 was negative.    REVIEW OF SYSTEMS:   Constitutional: Denies fevers, chills or abnormal weight loss Eyes: Denies blurriness of vision Ears, nose, mouth, throat, and face: Denies mucositis or sore throat Respiratory: Denies cough, dyspnea or wheezes Cardiovascular: Denies palpitation, chest discomfort or lower extremity swelling Gastrointestinal:  Denies nausea, heartburn or change in bowel habits Skin: Denies abnormal skin rashes Lymphatics: Denies new lymphadenopathy or easy bruising Neurological:Denies numbness, tingling or new weaknesses Behavioral/Psych: Mood is stable, no new changes  All other systems were reviewed with the patient and are negative.  MEDICAL HISTORY:  Past Medical History:  Diagnosis Date   Anxiety    Cancer ([facility] 09/24/2019   right breast cancer-had lumpectomy   Depression    DUB (dysfunctional uterine bleeding)    Family history of pancreatic cancer    Hypertension    Personal history of chemotherapy    Personal history of radiation therapy     SURGICAL HISTORY: Past Surgical History:  Procedure Laterality Date   BREAST LUMPECTOMY     BREAST LUMPECTOMY WITH AXILLARY LYMPH NODE BIOPSY Right 09/24/2019   Procedure: RIGHT BREAST LUMPECTOMY WITH SENTINEL LYMPH NODE BIOPSY;  Surgeon: TJovita Kussmaul MD;  [place] [facility]  Service: General;  Laterality: Right;   BREAST SURGERY   09/24/2019   Breast lump excised   FOOT SURGERY     IR REMOVAL TUN ACCESS W/ PORT W/O FL MOD SED  09/15/2021   LAPAROSCOPIC GASTRIC BANDING     PORTACATH PLACEMENT N/A 10/25/2019   Procedure: INSERTION PORT-A-CATH WITH POSSIBLE ULTRASOUND;  Surgeon: TJovita Kussmaul MD;  Location: [facility];  Service: General;  Laterality: N/A;   WISDOM TOOTH EXTRACTION      I have reviewed the social history and family history with the patient and they are unchanged from previous note.  ALLERGIES:  is allergic to hydrochlorothiazide, codeine, and lisinopril.  MEDICATIONS:  Current Outpatient Medications  Medication Sig Dispense Refill   amLODipine (NORVASC) 10 MG tablet Take 10 mg by mouth daily.     Bioflavonoid Products (ESTER C PO) Take 1 tablet by mouth daily.     Calcium Carbonate-Vitamin D (CALCIUM + D PO) Take 1 tablet by mouth daily.      cetirizine (ZYRTEC) 10 MG tablet Take 10 mg by mouth daily as needed for allergies.     cholecalciferol (VITAMIN D) 25 MCG (1000 UT) tablet Take 1,000 Units by mouth daily.     citalopram (CELEXA) 20  MG tablet Take 20 mg by mouth daily.     gabapentin (NEURONTIN) 100 MG capsule Take 3 capsules (300 mg total) by mouth at bedtime. 90 capsule 1   gabapentin (NEURONTIN) 300 MG capsule Take 1 capsule (300 mg total) by mouth at bedtime. 7 capsule 0   HYDROcodone-acetaminophen (NORCO/VICODIN) 5-325 MG tablet Take 1-2 tablets by mouth every 6 (six) hours as needed for moderate pain or severe pain. (Patient not taking: Reported on 02/29/2020) 10 tablet 0   letrozole (FEMARA) 2.5 MG tablet Take 1 tablet (2.5 mg total) by mouth daily. 90 tablet 3   letrozole (FEMARA) 2.5 MG tablet Take 1 tablet (2.5 mg total) by mouth daily. 7 tablet 0   letrozole (FEMARA) 2.5 MG tablet TAKE 1 TABLET(2.5 MG) BY MOUTH DAILY 90 tablet 1   LORazepam (ATIVAN) 0.5 MG tablet Take 1-2 tablets (0.5-1 mg total) by mouth every 8 (eight) hours as needed for anxiety (nausea). 20 tablet 0   losartan  (COZAAR) 100 MG tablet Take 100 mg by mouth daily.     Multiple Vitamin (MULTIVITAMIN WITH MINERALS) TABS tablet Take 1 tablet by mouth daily.     promethazine (PHENERGAN) 25 MG tablet Take 1 tablet (25 mg total) by mouth every 6 (six) hours as needed for nausea or vomiting. (Patient not taking: Reported on 02/29/2020) 30 tablet 0   traZODone (DESYREL) 100 MG tablet TAKE 1 TABLET(100 MG) BY MOUTH AT BEDTIME 30 tablet 0   traZODone (DESYREL) 100 MG tablet Take 1 tablet (100 mg total) by mouth at bedtime. 7 tablet 0   No current facility-administered medications for this visit.    PHYSICAL EXAMINATION: ECOG PERFORMANCE STATUS: {CHL ONC ECOG PS:[phone]}  There were no vitals filed for this visit. There were no vitals filed for this visit.  GENERAL:alert, no distress and comfortable SKIN: skin color, texture, turgor are normal, no rashes or significant lesions EYES: normal, Conjunctiva are pink and non-injected, sclera clear OROPHARYNX:no exudate, no erythema and lips, buccal mucosa, and tongue normal  NECK: supple, thyroid normal size, non-tender, without nodularity LYMPH:  no palpable lymphadenopathy in the cervical, axillary or inguinal LUNGS: clear to auscultation and percussion with normal breathing effort HEART: regular rate & rhythm and no murmurs and no lower extremity edema ABDOMEN:abdomen soft, non-tender and normal bowel sounds Musculoskeletal:no cyanosis of digits and no clubbing  NEURO: alert & oriented x 3 with fluent speech, no focal motor/sensory deficits  LABORATORY DATA:  I have reviewed the data as listed CBC Latest Ref Rng & Units 08/06/2021 04/06/2021 06/27/2020  WBC 4.0 - 10.5 K/uL 5.3 7.2 4.0  Hemoglobin 12.0 - 15.0 g/dL 12.0 12.3 11.3(L)  Hematocrit 36.0 - 46.0 % 37.3 37.9 35.8(L)  Platelets 150 - 400 K/uL 347 390 302     CMP Latest Ref Rng & Units 08/06/2021 04/06/2021 06/27/2020  Glucose 70 - 99 mg/dL 101(H) 88 81  BUN 6 - 20 mg/dL _0 Creatinine 0.44 -  1.00 mg/dL 0.77 0.76 0.80  Sodium 135 - 145 mmol/L 140 141 140  Potassium 3.5 - 5.1 mmol/L 3.8 3.6 3.6  Chloride 98 - 111 mmol/L 104 104 106  CO2 22 - 32 mmol/L _1 Calcium 8.9 - 10.3 mg/dL 10.1 10.0 10.4(H)  Total Protein 6.5 - 8.1 g/dL 8.1 8.2(H) 7.5  Total Bilirubin 0.3 - 1.2 mg/dL 0.4 0.3 0.4  Alkaline Phos 38 - 126 U/L 96 91 68  AST 15 - 41 U/L 15 14(L) 17  ALT  0 - 44 U/L _0 RADIOGRAPHIC STUDIES: I have personally reviewed the radiological images as listed and agreed with the findings in the report. No results found.   ASSESSMENT & PLAN:  No problem-specific Assessment & Plan notes found for this encounter.   No orders of the defined types were placed in this encounter.  All questions were answered. The patient knows to call the clinic with any problems, questions or concerns. No barriers to learning was detected. I spent {CHL ONC TIME VISIT - QVOHC:[id number]} counseling the patient face to face. The total time spent in the appointment was {CHL ONC TIME VISIT - VGVSY:[id number]} and more than 50% was on counseling and review of test results     Paula Feeling, NP 12/14/21

## 2021-12-15 ENCOUNTER — Telehealth: Payer: Self-pay | Admitting: Nurse Practitioner

## 2021-12-15 NOTE — Telephone Encounter (Signed)
Sch per 1/30 inbasket, left msg

## 2022-01-10 NOTE — Progress Notes (Unsigned)
[facility]   [phone] [phone]   Clinic Follow up Note   Patient Care Team: Glenna Durand, MD as PCP - General (Family Medicine) Pershing Proud, RN as Oncology Nurse Navigator Donnelly Angelica, RN as Oncology Nurse Navigator Griselda Miner, MD as Consulting Physician (General Surgery) Malachy Mood, MD as Consulting Physician (Hematology) Lonie Peak, MD as Attending Physician (Radiation Oncology) Cherly Anderson, MD as Referring Physician Pollyann Samples, NP as Nurse Practitioner (Nurse Practitioner) 01/10/2022  CHIEF COMPLAINT: Follow up right breast cancer   SUMMARY OF ONCOLOGIC HISTORY: Oncology History Overview Note  Cancer Staging Malignant neoplasm of upper-outer quadrant of right breast in female, estrogen receptor positive (HCC) Staging form: Breast, AJCC 8th Edition - Clinical stage from 08/28/2019: Stage IB (cT2, cN0, cM0, G2, ER+, PR+, HER2-) - Signed by Malachy Mood, MD on 09/04/2019 - Pathologic stage from 09/24/2019: Stage IB (pT2, pN1a, cM0, G2, ER+, PR+, HER2-) - Signed by Malachy Mood, MD on 10/10/2019    Malignant neoplasm of upper-outer quadrant of right breast in female, estrogen receptor positive (HCC)  08/24/2019 Mammogram   Diagnostic mammogram and [place] 08/24/19 IMPRESSION: Suspicious right breast mass 10 o'clock 1 cm from the nipple measuring 3.3 x 1.9 x 1.9 cm and axillary adenopathy.   08/28/2019 Initial Biopsy   Diagnosis 08/28/19 1. Breast, right, needle core biopsy, 10 o'clock - INVASIVE MAMMARY CARCINOMA, GRADE II. - SEE MICROSCOPIC DESCRIPTION. 2. Lymph node, needle/core biopsy, right axilla - BENIGN LYMPH NODE. - NO METASTATIC CARCINOMA IDENTIFIED.    08/28/2019 Receptors her2   Results: IMMUNOHISTOCHEMICAL AND MORPHOMETRIC ANALYSIS PERFORMED MANUALLY The tumor cells are NEGATIVE for Her2 (1+). Estrogen Receptor: 100%, POSITIVE, STRONG STAINING INTENSITY Progesterone Receptor: 100%, POSITIVE,  STRONG STAINING INTENSITY Proliferation Marker Ki67: 10%   08/28/2019 Cancer Staging   Staging form: Breast, AJCC 8th Edition - Clinical stage from 08/28/2019: Stage IB (cT2, cN0, cM0, G2, ER+, PR+, HER2-) - Signed by Malachy Mood, MD on 09/04/2019    08/31/2019 Initial Diagnosis   Malignant neoplasm of upper-outer quadrant of right breast in female, estrogen receptor positive (HCC)   09/07/2019 Breast MRI   Breast MRI 09/07/19  IMPRESSION: 1. 2.5 cm known malignancy in the anterior right breast, superficial depth near the nipple. 2. 7-8 mm indeterminate mass in the central upper outer quadrant of the right breast. 3. One right axillary lymph node with apparent mild cortical thickening, which lies slightly anterior and inferior to the biopsied right axillary lymph node ( benign reactive on pathology). This is most likely also benign given the pathology from the adjacent biopsied lymph node. 4. No evidence of left breast malignancy.   09/17/2019 Imaging   [place] right breast 09/17/19  IMPRESSION: Several benign appearing masses in the upper outer quadrant of the right breast without a definite correlate to the finding seen on prior MRI. The right axillary lymph nodes appear similar to the recently biopsied benign lymph node.   09/20/2019 Pathology Results   Diagnosis 09/20/19  Breast, right, needle core biopsy, upper outer quadrant - FIBROADENOMA - NO MALIGNANCY IDENTIFIED    09/21/2019 Genetic Testing   Negative genetic testing:  No pathogenic variants detected on the [facility] Breast Cancer STAT panel and Common Hereditary Cancers panel.  A variant of uncertain significance was identified in the BRCA1 gene, called c.2048A>G (Q.[id number]).  The report date is 09/21/2019.  The STAT Breast cancer panel offered by [facility] includes sequencing and rearrangement analysis for the following 9 genes:  ATM, BRCA1, BRCA2, CDH1, CHEK2, PALB2, PTEN, STK11 and TP53.   The Common Hereditary Cancers  Panel offered by [facility] includes sequencing and/or deletion duplication testing of the following 48 genes: APC, ATM, AXIN2, BARD1, BMPR1A, BRCA1, BRCA2, BRIP1, CDH1, CDK4, CDKN2A (p14ARF), CDKN2A (p16INK4a), CHEK2, CTNNA1, DICER1, EPCAM (Deletion/duplication testing only), GREM1 (promoter region deletion/duplication testing only), KIT, MEN1, MLH1, MSH2, MSH3, MSH6, MUTYH, NBN, NF1, NHTL1, PALB2, PDGFRA, PMS2, POLD1, POLE, PTEN, RAD50, RAD51C, RAD51D, RNF43, SDHB, SDHC, SDHD, SMAD4, SMARCA4. STK11, TP53, TSC1, TSC2, and VHL.  The following genes were evaluated for sequence changes only: SDHA and HOXB13 c.251G>A variant only.    09/24/2019 Surgery   RIGHT BREAST LUMPECTOMY WITH SENTINEL LYMPH NODE BIOPSY by Dr Marlou Starks  09/24/19    09/24/2019 Pathology Results   FINAL MICROSCOPIC DIAGNOSIS: 09/24/19   A. LYMPH NODE, RIGHT #1, SENTINEL, BIOPSY:  - There is no evidence of carcinoma in 1 of 1 lymph node (0/1).   B. LYMPH NODE, RIGHT, SENTINEL, BIOPSY:  - There is no evidence of carcinoma in 1 of 1 lymph node (0/1).   C. 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   D. LYMPH NODE, RIGHT #2, SENTINEL, BIOPSY:  - There is no evidence of carcinoma in 1 of 1 lymph node (0/1).   E. LYMPH NODE, RIGHT #3 , SENTINEL, BIOPSY:  - There is no evidence of carcinoma in 1 of 1 lymph node (0/1).   F. LYMPH NODE, RIGHT, SENTINEL, BIOPSY:  - There is no evidence of carcinoma in 1 of 1 lymph node (0/1).   G. LYMPH NODE, RIGHT, SENTINEL, BIOPSY:  -There is no evidence of carcinoma in 1 of 1 lymph node (0/1).   H. 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  - Ductal carcinoma in situ, intermediate grade.  - Perineural invasion is identified.  - The surgical resection margins are negative for carcinoma.  - See oncology table below   I. BREAST, RIGHT ADDITIONAL INFERIOR MARGIN, EXCISION:  - Fibrocystic changes.  - There is  no evidence of malignancy.     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 Postoperative findings in the right breast and axilla. Upper normal sized right axillary lymph nodes, nonspecific. 2. 2 mm superior apical segment right upper lobe nodule is technically nonspecific although statistically likely to be benign. 3. 3.6 cm hypodense right adnexal structure, probably a cyst but technically nonspecific. If patient is premenopausal, no follow up is warranted; if early postmenopausal, follow up ultrasound in 6-12 months would be recommended. 4. Other imaging findings of potential clinical significance: Small type 1 hiatal hernia. Descending and sigmoid colon diverticulosis. Small indirect left inguinal hernia contains adipose tissue.   Aortic Atherosclerosis (ICD10-I70.0).   10/26/2019 Imaging   Whole Body  Bone scan 10/26/19  IMPRESSION: Nonspecific sites of uptake at the RIGHT ischium and anterior LEFT sixth rib, cannot exclude metastatic foci.   Otherwise negative exam.     10/29/2019 - 04/04/2020 Chemotherapy   Adjuvant AC q2weeks for 4 cycles starting 10/29/19-12/21/19 followed by weekly Taxol for 12 weeks starting 01/11/20. Taxol reduced 30% starting with C6. Completed on 04/04/20   03/24/2020 Breast MRI   IMPRESSION: 1. Postsurgical changes in the right breast and  right axilla. 2. No MRI evidence of malignancy in either breast.   RECOMMENDATION: Diagnostic bilateral mammogram in September 2021.   BI-RADS CATEGORY  2: Benign.   04/21/2020 - 06/18/2020 Radiation Therapy   adjuvant Radiation in [facility] 04/21/20-06/18/20 with Marguerita Merles   12/2020 -  Anti-estrogen oral therapy   Letrozole 2.$RemoveBefo'5mg'gNgCylseReL$  once daily starting 12/2020     CURRENT THERAPY: Letrozole 2.5 mg po once daily, starting 12/2020  INTERVAL HISTORY: Paula Davidson returns for follow up as scheduled. Last seen by me 08/06/21. Port removed 09/2021, DEXA 10/01/21 was normal. Mammo 1/23 was negative, showed breast density cat d.    REVIEW OF SYSTEMS:   Constitutional: Denies fevers, chills or abnormal weight loss Eyes: Denies blurriness of vision Ears, nose, mouth, throat, and face: Denies mucositis or sore throat Respiratory: Denies cough, dyspnea or wheezes Cardiovascular: Denies palpitation, chest discomfort or lower extremity swelling Gastrointestinal:  Denies nausea, heartburn or change in bowel habits Skin: Denies abnormal skin rashes Lymphatics: Denies new lymphadenopathy or easy bruising Neurological:Denies numbness, tingling or new weaknesses Behavioral/Psych: Mood is stable, no new changes  All other systems were reviewed with the patient and are negative.  MEDICAL HISTORY:  Past Medical History:  Diagnosis Date   Anxiety    Cancer ([place]) 09/24/2019   right breast cancer-had lumpectomy   Depression    DUB (dysfunctional uterine bleeding)    Family history of pancreatic cancer    Hypertension    Personal history of chemotherapy    Personal history of radiation therapy     SURGICAL HISTORY: Past Surgical History:  Procedure Laterality Date   BREAST LUMPECTOMY     BREAST LUMPECTOMY WITH AXILLARY LYMPH NODE BIOPSY Right 09/24/2019   Procedure: RIGHT BREAST LUMPECTOMY WITH SENTINEL LYMPH NODE BIOPSY;  Surgeon: Jovita Kussmaul, MD;  Location: [facility];  Service:  General;  Laterality: Right;   BREAST SURGERY  09/24/2019   Breast lump excised   FOOT SURGERY     IR REMOVAL TUN ACCESS W/ PORT W/O FL MOD SED  09/15/2021   LAPAROSCOPIC GASTRIC BANDING     PORTACATH PLACEMENT N/A 10/25/2019   Procedure: INSERTION PORT-A-CATH WITH POSSIBLE ULTRASOUND;  Surgeon: Jovita Kussmaul, MD;  Location: [facility];  Service: General;  Laterality: N/A;   WISDOM TOOTH EXTRACTION      I have reviewed the social history and family history with the patient and they are unchanged from previous note.  ALLERGIES:  is allergic to hydrochlorothiazide, codeine, and lisinopril.  MEDICATIONS:  Current Outpatient Medications  Medication Sig Dispense Refill   amLODipine (NORVASC) 10 MG tablet Take 10 mg by mouth daily.     Bioflavonoid Products (ESTER C PO) Take 1 tablet by mouth daily.     Calcium Carbonate-Vitamin D (CALCIUM + D PO) Take 1 tablet by mouth daily.      cetirizine (ZYRTEC) 10 MG tablet Take 10 mg by mouth daily as needed for allergies.     cholecalciferol (VITAMIN D) 25 MCG (1000 UT) tablet Take 1,000 Units  by mouth daily.     citalopram (CELEXA) 20 MG tablet Take 20 mg by mouth daily.     gabapentin (NEURONTIN) 100 MG capsule Take 3 capsules (300 mg total) by mouth at bedtime. 90 capsule 1   gabapentin (NEURONTIN) 300 MG capsule Take 1 capsule (300 mg total) by mouth at bedtime. 7 capsule 0   HYDROcodone-acetaminophen (NORCO/VICODIN) 5-325 MG tablet Take 1-2 tablets by mouth every 6 (six) hours as needed for moderate pain or severe pain. (Patient not taking: Reported on 02/29/2020) 10 tablet 0   letrozole (FEMARA) 2.5 MG tablet Take 1 tablet (2.5 mg total) by mouth daily. 90 tablet 3   letrozole (FEMARA) 2.5 MG tablet Take 1 tablet (2.5 mg total) by mouth daily. 7 tablet 0   letrozole (FEMARA) 2.5 MG tablet TAKE 1 TABLET(2.5 MG) BY MOUTH DAILY 90 tablet 1   LORazepam (ATIVAN) 0.5 MG tablet Take 1-2 tablets (0.5-1 mg total) by mouth every 8 (eight) hours as needed for  anxiety (nausea). 20 tablet 0   losartan (COZAAR) 100 MG tablet Take 100 mg by mouth daily.     Multiple Vitamin (MULTIVITAMIN WITH MINERALS) TABS tablet Take 1 tablet by mouth daily.     promethazine (PHENERGAN) 25 MG tablet Take 1 tablet (25 mg total) by mouth every 6 (six) hours as needed for nausea or vomiting. (Patient not taking: Reported on 02/29/2020) 30 tablet 0   traZODone (DESYREL) 100 MG tablet TAKE 1 TABLET(100 MG) BY MOUTH AT BEDTIME 30 tablet 0   traZODone (DESYREL) 100 MG tablet Take 1 tablet (100 mg total) by mouth at bedtime. 7 tablet 0   No current facility-administered medications for this visit.    PHYSICAL EXAMINATION: ECOG PERFORMANCE STATUS: {CHL ONC ECOG PS:[phone]}  There were no vitals filed for this visit. There were no vitals filed for this visit.  GENERAL:alert, no distress and comfortable SKIN: skin color, texture, turgor are normal, no rashes or significant lesions EYES: normal, Conjunctiva are pink and non-injected, sclera clear OROPHARYNX:no exudate, no erythema and lips, buccal mucosa, and tongue normal  NECK: supple, thyroid normal size, non-tender, without nodularity LYMPH:  no palpable lymphadenopathy in the cervical, axillary or inguinal LUNGS: clear to auscultation and percussion with normal breathing effort HEART: regular rate & rhythm and no murmurs and no lower extremity edema ABDOMEN:abdomen soft, non-tender and normal bowel sounds Musculoskeletal:no cyanosis of digits and no clubbing  NEURO: alert & oriented x 3 with fluent speech, no focal motor/sensory deficits  LABORATORY DATA:  I have reviewed the data as listed CBC Latest Ref Rng & Units 08/06/2021 04/06/2021 06/27/2020  WBC 4.0 - 10.5 K/uL 5.3 7.2 4.0  Hemoglobin 12.0 - 15.0 g/dL 12.0 12.3 11.3(L)  Hematocrit 36.0 - 46.0 % 37.3 37.9 35.8(L)  Platelets 150 - 400 K/uL 347 390 302     CMP Latest Ref Rng & Units 08/06/2021 04/06/2021 06/27/2020  Glucose 70 - 99 mg/dL 101(H) 88 81  BUN  6 - 20 mg/dL $Remove'10 8 8  'HBWKnwU$ Creatinine 0.44 - 1.00 mg/dL 0.77 0.76 0.80  Sodium 135 - 145 mmol/L 140 141 140  Potassium 3.5 - 5.1 mmol/L 3.8 3.6 3.6  Chloride 98 - 111 mmol/L 104 104 106  CO2 22 - 32 mmol/L $RemoveB'25 27 24  'dkMSZjrD$ Calcium 8.9 - 10.3 mg/dL 10.1 10.0 10.4(H)  Total Protein 6.5 - 8.1 g/dL 8.1 8.2(H) 7.5  Total Bilirubin 0.3 - 1.2 mg/dL 0.4 0.3 0.4  Alkaline Phos 38 - 126 U/L 96 91 68  AST 15 - 41 U/L 15 14(L) 17  ALT 0 - 44 U/L $Remo'20 19 21      'DbIJj$ RADIOGRAPHIC STUDIES: I have personally reviewed the radiological images as listed and agreed with the findings in the report. No results found.   ASSESSMENT & PLAN:  No problem-specific Assessment & Plan notes found for this encounter.   No orders of the defined types were placed in this encounter.  All questions were answered. The patient knows to call the clinic with any problems, questions or concerns. No barriers to learning was detected. I spent {CHL ONC TIME VISIT - SQSYP:[id number]} counseling the patient face to face. The total time spent in the appointment was {CHL ONC TIME VISIT - KISNG:[id number]} and more than 50% was on counseling and review of test results     Alla Feeling, NP 01/10/22

## 2022-01-11 ENCOUNTER — Inpatient Hospital Stay: Payer: BC Managed Care – PPO | Attending: Nurse Practitioner

## 2022-01-11 ENCOUNTER — Inpatient Hospital Stay: Payer: BC Managed Care – PPO | Admitting: Nurse Practitioner

## 2022-03-07 ENCOUNTER — Other Ambulatory Visit: Payer: Self-pay | Admitting: Nurse Practitioner

## 2022-03-24 ENCOUNTER — Telehealth: Payer: Self-pay | Admitting: Nurse Practitioner

## 2022-03-24 NOTE — Telephone Encounter (Signed)
.  Called patient to schedule appointment per 5/10 inbasket, patient is aware of date and time.   ?

## 2022-04-30 ENCOUNTER — Other Ambulatory Visit: Payer: Self-pay

## 2022-04-30 DIAGNOSIS — Z170 Estrogen receptor positive status [ER+]: Secondary | ICD-10-CM

## 2022-05-02 NOTE — Progress Notes (Deleted)
[place]   [phone] Fax:([phone]) [phone]   Clinic Follow up Note   Patient Care Team: Burnett Sheng, MD as PCP - General (Family Medicine) Mauro Kaufmann, RN as Oncology Nurse Navigator Rockwell Germany, RN as Oncology Nurse Navigator Jovita Kussmaul, MD as Consulting Physician (General Surgery) Truitt Merle, MD as Consulting Physician (Hematology) Eppie Gibson, MD as Attending Physician (Radiation Oncology) Leanna Sato, MD as Referring Physician Alla Feeling, NP as Nurse Practitioner (Nurse Practitioner) 05/02/2022  CHIEF COMPLAINT: Follow up right breast cancer   SUMMARY OF ONCOLOGIC HISTORY: Oncology History Overview Note  Cancer Staging Malignant neoplasm of upper-outer quadrant of right breast in female, estrogen receptor positive ([place]) Staging form: Breast, AJCC 8th Edition - Clinical stage from 08/28/2019: Stage IB (cT2, cN0, cM0, G2, ER+, PR+, HER2-) - Signed by Truitt Merle, MD on 09/04/2019 - Pathologic stage from 09/24/2019: Stage IB (pT2, pN1a, cM0, G2, ER+, PR+, HER2-) - Signed by Truitt Merle, MD on 10/10/2019    Malignant neoplasm of upper-outer quadrant of right breast in female, estrogen receptor positive ([place])  08/24/2019 Mammogram   Diagnostic mammogram and [place] 08/24/19 IMPRESSION: Suspicious right breast mass 10 o'clock 1 cm from the nipple measuring 3.3 x 1.9 x 1.9 cm and axillary adenopathy.   08/28/2019 Initial Biopsy   Diagnosis 08/28/19 1. Breast, right, needle core biopsy, 10 o'clock - INVASIVE MAMMARY CARCINOMA, GRADE II. - SEE MICROSCOPIC DESCRIPTION. 2. Lymph node, needle/core biopsy, right axilla - BENIGN LYMPH NODE. - NO METASTATIC CARCINOMA IDENTIFIED.    08/28/2019 Receptors her2   Results: IMMUNOHISTOCHEMICAL AND MORPHOMETRIC ANALYSIS PERFORMED MANUALLY The tumor cells are NEGATIVE for Her2 (1+). Estrogen Receptor: 100%, POSITIVE, STRONG STAINING INTENSITY Progesterone Receptor: 100%, POSITIVE,  STRONG STAINING INTENSITY Proliferation Marker Ki67: 10%   08/28/2019 Cancer Staging   Staging form: Breast, AJCC 8th Edition - Clinical stage from 08/28/2019: Stage IB (cT2, cN0, cM0, G2, ER+, PR+, HER2-) - Signed by Truitt Merle, MD on 09/04/2019   08/31/2019 Initial Diagnosis   Malignant neoplasm of upper-outer quadrant of right breast in female, estrogen receptor positive ([place])   09/07/2019 Breast MRI   Breast MRI 09/07/19  IMPRESSION: 1. 2.5 cm known malignancy in the anterior right breast, superficial depth near the nipple. 2. 7-8 mm indeterminate mass in the central upper outer quadrant of the right breast. 3. One right axillary lymph node with apparent mild cortical thickening, which lies slightly anterior and inferior to the biopsied right axillary lymph node ( benign reactive on pathology). This is most likely also benign given the pathology from the adjacent biopsied lymph node. 4. No evidence of left breast malignancy.   09/17/2019 Imaging   [place] right breast 09/17/19  IMPRESSION: Several benign appearing masses in the upper outer quadrant of the right breast without a definite correlate to the finding seen on prior MRI. The right axillary lymph nodes appear similar to the recently biopsied benign lymph node.   09/20/2019 Pathology Results   Diagnosis 09/20/19  Breast, right, needle core biopsy, upper outer quadrant - FIBROADENOMA - NO MALIGNANCY IDENTIFIED    09/21/2019 Genetic Testing   Negative genetic testing:  No pathogenic variants detected on the [facility] Breast Cancer STAT panel and Common Hereditary Cancers panel.  A variant of uncertain significance was identified in the BRCA1 gene, called c.2048A>G (Q.[id number]).  The report date is 09/21/2019.  The STAT Breast cancer panel offered by [facility] includes sequencing and rearrangement analysis for the following 9 genes:  ATM,  BRCA1, BRCA2, CDH1, CHEK2, PALB2, PTEN, STK11 and TP53.   The Common Hereditary Cancers Panel  offered by [facility] includes sequencing and/or deletion duplication testing of the following 48 genes: APC, ATM, AXIN2, BARD1, BMPR1A, BRCA1, BRCA2, BRIP1, CDH1, CDK4, CDKN2A (p14ARF), CDKN2A (p16INK4a), CHEK2, CTNNA1, DICER1, EPCAM (Deletion/duplication testing only), GREM1 (promoter region deletion/duplication testing only), KIT, MEN1, MLH1, MSH2, MSH3, MSH6, MUTYH, NBN, NF1, NHTL1, PALB2, PDGFRA, PMS2, POLD1, POLE, PTEN, RAD50, RAD51C, RAD51D, RNF43, SDHB, SDHC, SDHD, SMAD4, SMARCA4. STK11, TP53, TSC1, TSC2, and VHL.  The following genes were evaluated for sequence changes only: SDHA and HOXB13 c.251G>A variant only.    09/24/2019 Surgery   RIGHT BREAST LUMPECTOMY WITH SENTINEL LYMPH NODE BIOPSY by Dr Marlou Starks  09/24/19    09/24/2019 Pathology Results   FINAL MICROSCOPIC DIAGNOSIS: 09/24/19   A. LYMPH NODE, RIGHT #1, SENTINEL, BIOPSY:  - There is no evidence of carcinoma in 1 of 1 lymph node (0/1).   B. LYMPH NODE, RIGHT, SENTINEL, BIOPSY:  - There is no evidence of carcinoma in 1 of 1 lymph node (0/1).   C. 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   D. LYMPH NODE, RIGHT #2, SENTINEL, BIOPSY:  - There is no evidence of carcinoma in 1 of 1 lymph node (0/1).   E. LYMPH NODE, RIGHT #3 , SENTINEL, BIOPSY:  - There is no evidence of carcinoma in 1 of 1 lymph node (0/1).   F. LYMPH NODE, RIGHT, SENTINEL, BIOPSY:  - There is no evidence of carcinoma in 1 of 1 lymph node (0/1).   G. LYMPH NODE, RIGHT, SENTINEL, BIOPSY:  -There is no evidence of carcinoma in 1 of 1 lymph node (0/1).   H. 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  - Ductal carcinoma in situ, intermediate grade.  - Perineural invasion is identified.  - The surgical resection margins are negative for carcinoma.  - See oncology table below   I. BREAST, RIGHT ADDITIONAL INFERIOR MARGIN, EXCISION:  - Fibrocystic changes.  - There is no  evidence of malignancy.     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 Postoperative findings in the right breast and axilla. Upper normal sized right axillary lymph nodes, nonspecific. 2. 2 mm superior apical segment right upper lobe nodule is technically nonspecific although statistically likely to be benign. 3. 3.6 cm hypodense right adnexal structure, probably a cyst but technically nonspecific. If patient is premenopausal, no follow up is warranted; if early postmenopausal, follow up ultrasound in 6-12 months would be recommended. 4. Other imaging findings of potential clinical significance: Small type 1 hiatal hernia. Descending and sigmoid colon diverticulosis. Small indirect left inguinal hernia contains adipose tissue.   Aortic Atherosclerosis (ICD10-I70.0).   10/26/2019 Imaging   Whole Body Bone scan  10/26/19  IMPRESSION: Nonspecific sites of uptake at the RIGHT ischium and anterior LEFT sixth rib, cannot exclude metastatic foci.   Otherwise negative exam.     10/29/2019 - 04/04/2020 Chemotherapy   Adjuvant AC q2weeks for 4 cycles starting 10/29/19-12/21/19 followed by weekly Taxol for 12 weeks starting 01/11/20. Taxol reduced 30% starting with C6. Completed on 04/04/20   03/24/2020 Breast MRI   IMPRESSION: 1. Postsurgical changes in the right breast and right  axilla. 2. No MRI evidence of malignancy in either breast.   RECOMMENDATION: Diagnostic bilateral mammogram in September 2021.   BI-RADS CATEGORY  2: Benign.   04/21/2020 - 06/18/2020 Radiation Therapy   adjuvant Radiation in [facility] 04/21/20-06/18/20 with Marguerita Merles   12/2020 -  Anti-estrogen oral therapy   Letrozole 2.4m once daily starting 12/2020     CURRENT THERAPY: Letrozole 2.5 mg once daily starting 12/2020  INTERVAL HISTORY: Ms. PBarbeereturns for follow up as scheduled. Last seen by me 08/06/21. DEXA 10/01/21 was normal. Mammogram 11/19/21 was negative, showed breast density cat d. She continues letrozole.    REVIEW OF SYSTEMS:   Constitutional: Denies fevers, chills or abnormal weight loss Eyes: Denies blurriness of vision Ears, nose, mouth, throat, and face: Denies mucositis or sore throat Respiratory: Denies cough, dyspnea or wheezes Cardiovascular: Denies palpitation, chest discomfort or lower extremity swelling Gastrointestinal:  Denies nausea, heartburn or change in bowel habits Skin: Denies abnormal skin rashes Lymphatics: Denies new lymphadenopathy or easy bruising Neurological:Denies numbness, tingling or new weaknesses Behavioral/Psych: Mood is stable, no new changes  All other systems were reviewed with the patient and are negative.  MEDICAL HISTORY:  Past Medical History:  Diagnosis Date   Anxiety    Cancer ([place] 09/24/2019   right breast cancer-had lumpectomy   Depression    DUB (dysfunctional uterine bleeding)    Family history of pancreatic cancer    Hypertension    Personal history of chemotherapy    Personal history of radiation therapy     SURGICAL HISTORY: Past Surgical History:  Procedure Laterality Date   BREAST LUMPECTOMY     BREAST LUMPECTOMY WITH AXILLARY LYMPH NODE BIOPSY Right 09/24/2019   Procedure: RIGHT BREAST LUMPECTOMY WITH SENTINEL LYMPH NODE BIOPSY;  Surgeon: TJovita Kussmaul MD;  [place]  Service:  General;  Laterality: Right;   BREAST SURGERY  09/24/2019   Breast lump excised   FOOT SURGERY     IR REMOVAL TUN ACCESS W/ PORT W/O FL MOD SED  09/15/2021   LAPAROSCOPIC GASTRIC BANDING     PORTACATH PLACEMENT N/A 10/25/2019   Procedure: INSERTION PORT-A-CATH WITH POSSIBLE ULTRASOUND;  Surgeon: TJovita Kussmaul MD;  Location: [facility];  Service: General;  Laterality: N/A;   WISDOM TOOTH EXTRACTION      I have reviewed the social history and family history with the patient and they are unchanged from previous note.  ALLERGIES:  is allergic to hydrochlorothiazide, codeine, and lisinopril.  MEDICATIONS:  Current Outpatient Medications  Medication Sig Dispense Refill   amLODipine (NORVASC) 10 MG tablet Take 10 mg by mouth daily.     Bioflavonoid Products (ESTER C PO) Take 1 tablet by mouth daily.     Calcium Carbonate-Vitamin D (CALCIUM + D PO) Take 1 tablet by mouth daily.      cetirizine (ZYRTEC) 10 MG tablet Take 10 mg by mouth daily as needed for allergies.     cholecalciferol (VITAMIN D) 25 MCG (1000 UT) tablet Take 1,000 Units by mouth daily.  citalopram (CELEXA) 20 MG tablet Take 20 mg by mouth daily.     gabapentin (NEURONTIN) 100 MG capsule TAKE 3 CAPSULES(300 MG) BY MOUTH AT BEDTIME 90 capsule 1   gabapentin (NEURONTIN) 300 MG capsule Take 1 capsule (300 mg total) by mouth at bedtime. 7 capsule 0   HYDROcodone-acetaminophen (NORCO/VICODIN) 5-325 MG tablet Take 1-2 tablets by mouth every 6 (six) hours as needed for moderate pain or severe pain. (Patient not taking: Reported on 02/29/2020) 10 tablet 0   letrozole (FEMARA) 2.5 MG tablet Take 1 tablet (2.5 mg total) by mouth daily. 90 tablet 3   letrozole (FEMARA) 2.5 MG tablet Take 1 tablet (2.5 mg total) by mouth daily. 7 tablet 0   letrozole (FEMARA) 2.5 MG tablet TAKE 1 TABLET(2.5 MG) BY MOUTH DAILY 90 tablet 1   LORazepam (ATIVAN) 0.5 MG tablet Take 1-2 tablets (0.5-1 mg total) by mouth every 8 (eight) hours as needed for anxiety  (nausea). 20 tablet 0   losartan (COZAAR) 100 MG tablet Take 100 mg by mouth daily.     Multiple Vitamin (MULTIVITAMIN WITH MINERALS) TABS tablet Take 1 tablet by mouth daily.     promethazine (PHENERGAN) 25 MG tablet Take 1 tablet (25 mg total) by mouth every 6 (six) hours as needed for nausea or vomiting. (Patient not taking: Reported on 02/29/2020) 30 tablet 0   traZODone (DESYREL) 100 MG tablet TAKE 1 TABLET(100 MG) BY MOUTH AT BEDTIME 30 tablet 0   traZODone (DESYREL) 100 MG tablet Take 1 tablet (100 mg total) by mouth at bedtime. 7 tablet 0   No current facility-administered medications for this visit.    PHYSICAL EXAMINATION: ECOG PERFORMANCE STATUS: {CHL ONC ECOG PS:[phone]}  There were no vitals filed for this visit. There were no vitals filed for this visit.  GENERAL:alert, no distress and comfortable SKIN: skin color, texture, turgor are normal, no rashes or significant lesions EYES: normal, Conjunctiva are pink and non-injected, sclera clear OROPHARYNX:no exudate, no erythema and lips, buccal mucosa, and tongue normal  NECK: supple, thyroid normal size, non-tender, without nodularity LYMPH:  no palpable lymphadenopathy in the cervical, axillary or inguinal LUNGS: clear to auscultation and percussion with normal breathing effort HEART: regular rate & rhythm and no murmurs and no lower extremity edema ABDOMEN:abdomen soft, non-tender and normal bowel sounds Musculoskeletal:no cyanosis of digits and no clubbing  NEURO: alert & oriented x 3 with fluent speech, no focal motor/sensory deficits  LABORATORY DATA:  I have reviewed the data as listed    Latest Ref Rng & Units 08/06/2021    8:28 AM 04/06/2021    8:05 AM 06/27/2020    9:47 AM  CBC  WBC 4.0 - 10.5 K/uL 5.3  7.2  4.0   Hemoglobin 12.0 - 15.0 g/dL 12.0  12.3  11.3   Hematocrit 36.0 - 46.0 % 37.3  37.9  35.8   Platelets 150 - 400 K/uL 347  390  302         Latest Ref Rng & Units 08/06/2021    8:28 AM 04/06/2021     8:05 AM 06/27/2020    9:47 AM  CMP  Glucose 70 - 99 mg/dL 101  88  81   BUN 6 - 20 mg/dL 10  8  8    Creatinine 0.44 - 1.00 mg/dL 0.77  0.76  0.80   Sodium 135 - 145 mmol/L 140  141  140   Potassium 3.5 - 5.1 mmol/L 3.8  3.6  3.6   Chloride  98 - 111 mmol/L 104  104  106   CO2 22 - 32 mmol/L 25  27  24    Calcium 8.9 - 10.3 mg/dL 10.1  10.0  10.4   Total Protein 6.5 - 8.1 g/dL 8.1  8.2  7.5   Total Bilirubin 0.3 - 1.2 mg/dL 0.4  0.3  0.4   Alkaline Phos 38 - 126 U/L 96  91  68   AST 15 - 41 U/L 15  14  17    ALT 0 - 44 U/L 20  19  21        RADIOGRAPHIC STUDIES: I have personally reviewed the radiological images as listed and agreed with the findings in the report. No results found.   ASSESSMENT & PLAN:  No problem-specific Assessment & Plan notes found for this encounter.   No orders of the defined types were placed in this encounter.  All questions were answered. The patient knows to call the clinic with any problems, questions or concerns. No barriers to learning was detected. I spent {CHL ONC TIME VISIT - CJARW:[id number]} counseling the patient face to face. The total time spent in the appointment was {CHL ONC TIME VISIT - EIHDT:[id number]} and more than 50% was on counseling and review of test results     Alla Feeling, NP 05/02/22

## 2022-05-03 ENCOUNTER — Inpatient Hospital Stay: Payer: BC Managed Care – PPO | Admitting: Nurse Practitioner

## 2022-05-03 ENCOUNTER — Inpatient Hospital Stay: Payer: BC Managed Care – PPO | Attending: Nurse Practitioner

## 2022-06-03 ENCOUNTER — Other Ambulatory Visit: Payer: Self-pay | Admitting: Hematology

## 2022-06-03 NOTE — Telephone Encounter (Signed)
Per Dr. Burr Medico, pt needs a f/u appt with provider before refilling her Trazodone.

## 2022-06-04 ENCOUNTER — Telehealth: Payer: Self-pay | Admitting: Hematology

## 2022-06-04 NOTE — Telephone Encounter (Signed)
.  Called patient to schedule appointment per 7/20 inbasket, patient is aware of date and time.   

## 2022-06-13 NOTE — Progress Notes (Deleted)
[place]   [phone] [phone]   Clinic Follow up Note   Patient Care Team: Burnett Sheng, MD as PCP - General (Family Medicine) Mauro Kaufmann, RN as Oncology Nurse Navigator Rockwell Germany, RN as Oncology Nurse Navigator Jovita Kussmaul, MD as Consulting Physician (General Surgery) Truitt Merle, MD as Consulting Physician (Hematology) Eppie Gibson, MD as Attending Physician (Radiation Oncology) Leanna Sato, MD as Referring Physician Alla Feeling, NP as Nurse Practitioner (Nurse Practitioner) 06/13/2022  CHIEF COMPLAINT: Follow up right breast cancer   SUMMARY OF ONCOLOGIC HISTORY: Oncology History Overview Note  Cancer Staging Malignant neoplasm of upper-outer quadrant of right breast in female, estrogen receptor positive ([place]) Staging form: Breast, AJCC 8th Edition - Clinical stage from 08/28/2019: Stage IB (cT2, cN0, cM0, G2, ER+, PR+, HER2-) - Signed by Truitt Merle, MD on 09/04/2019 - Pathologic stage from 09/24/2019: Stage IB (pT2, pN1a, cM0, G2, ER+, PR+, HER2-) - Signed by Truitt Merle, MD on 10/10/2019    Malignant neoplasm of upper-outer quadrant of right breast in female, estrogen receptor positive ([place])  08/24/2019 Mammogram   Diagnostic mammogram and [place] 08/24/19 IMPRESSION: Suspicious right breast mass 10 o'clock 1 cm from the nipple measuring 3.3 x 1.9 x 1.9 cm and axillary adenopathy.   08/28/2019 Initial Biopsy   Diagnosis 08/28/19 1. Breast, right, needle core biopsy, 10 o'clock - INVASIVE MAMMARY CARCINOMA, GRADE II. - SEE MICROSCOPIC DESCRIPTION. 2. Lymph node, needle/core biopsy, right axilla - BENIGN LYMPH NODE. - NO METASTATIC CARCINOMA IDENTIFIED.    08/28/2019 Receptors her2   Results: IMMUNOHISTOCHEMICAL AND MORPHOMETRIC ANALYSIS PERFORMED MANUALLY The tumor cells are NEGATIVE for Her2 (1+). Estrogen Receptor: 100%, POSITIVE, STRONG STAINING INTENSITY Progesterone Receptor: 100%, POSITIVE,  STRONG STAINING INTENSITY Proliferation Marker Ki67: 10%   08/28/2019 Cancer Staging   Staging form: Breast, AJCC 8th Edition - Clinical stage from 08/28/2019: Stage IB (cT2, cN0, cM0, G2, ER+, PR+, HER2-) - Signed by Truitt Merle, MD on 09/04/2019   08/31/2019 Initial Diagnosis   Malignant neoplasm of upper-outer quadrant of right breast in female, estrogen receptor positive ([place])   09/07/2019 Breast MRI   Breast MRI 09/07/19  IMPRESSION: 1. 2.5 cm known malignancy in the anterior right breast, superficial depth near the nipple. 2. 7-8 mm indeterminate mass in the central upper outer quadrant of the right breast. 3. One right axillary lymph node with apparent mild cortical thickening, which lies slightly anterior and inferior to the biopsied right axillary lymph node ( benign reactive on pathology). This is most likely also benign given the pathology from the adjacent biopsied lymph node. 4. No evidence of left breast malignancy.   09/17/2019 Imaging   [place] right breast 09/17/19  IMPRESSION: Several benign appearing masses in the upper outer quadrant of the right breast without a definite correlate to the finding seen on prior MRI. The right axillary lymph nodes appear similar to the recently biopsied benign lymph node.   09/20/2019 Pathology Results   Diagnosis 09/20/19  Breast, right, needle core biopsy, upper outer quadrant - FIBROADENOMA - NO MALIGNANCY IDENTIFIED    09/21/2019 Genetic Testing   Negative genetic testing:  No pathogenic variants detected on the [facility] Breast Cancer STAT panel and Common Hereditary Cancers panel.  A variant of uncertain significance was identified in the BRCA1 gene, called c.2048A>G ([id number]).  The report date is 09/21/2019.  The STAT Breast cancer panel offered by [facility] includes sequencing and rearrangement analysis for the following 9 genes:  ATM,  BRCA1, BRCA2, CDH1, CHEK2, PALB2, PTEN, STK11 and TP53.   The Common Hereditary Cancers Panel  offered by [facility] includes sequencing and/or deletion duplication testing of the following 48 genes: APC, ATM, AXIN2, BARD1, BMPR1A, BRCA1, BRCA2, BRIP1, CDH1, CDK4, CDKN2A (p14ARF), CDKN2A (p16INK4a), CHEK2, CTNNA1, DICER1, EPCAM (Deletion/duplication testing only), GREM1 (promoter region deletion/duplication testing only), KIT, MEN1, MLH1, MSH2, MSH3, MSH6, MUTYH, NBN, NF1, NHTL1, PALB2, PDGFRA, PMS2, POLD1, POLE, PTEN, RAD50, RAD51C, RAD51D, RNF43, SDHB, SDHC, SDHD, SMAD4, SMARCA4. STK11, TP53, TSC1, TSC2, and VHL.  The following genes were evaluated for sequence changes only: SDHA and HOXB13 c.251G>A variant only.    09/24/2019 Surgery   RIGHT BREAST LUMPECTOMY WITH SENTINEL LYMPH NODE BIOPSY by Dr Marlou Starks  09/24/19    09/24/2019 Pathology Results   FINAL MICROSCOPIC DIAGNOSIS: 09/24/19   A. LYMPH NODE, RIGHT #1, SENTINEL, BIOPSY:  - There is no evidence of carcinoma in 1 of 1 lymph node (0/1).   B. LYMPH NODE, RIGHT, SENTINEL, BIOPSY:  - There is no evidence of carcinoma in 1 of 1 lymph node (0/1).   C. 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   D. LYMPH NODE, RIGHT #2, SENTINEL, BIOPSY:  - There is no evidence of carcinoma in 1 of 1 lymph node (0/1).   E. LYMPH NODE, RIGHT #3 , SENTINEL, BIOPSY:  - There is no evidence of carcinoma in 1 of 1 lymph node (0/1).   F. LYMPH NODE, RIGHT, SENTINEL, BIOPSY:  - There is no evidence of carcinoma in 1 of 1 lymph node (0/1).   G. LYMPH NODE, RIGHT, SENTINEL, BIOPSY:  -There is no evidence of carcinoma in 1 of 1 lymph node (0/1).   H. 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  - Ductal carcinoma in situ, intermediate grade.  - Perineural invasion is identified.  - The surgical resection margins are negative for carcinoma.  - See oncology table below   I. BREAST, RIGHT ADDITIONAL INFERIOR MARGIN, EXCISION:  - Fibrocystic changes.  - There is no  evidence of malignancy.     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 Postoperative findings in the right breast and axilla. Upper normal sized right axillary lymph nodes, nonspecific. 2. 2 mm superior apical segment right upper lobe nodule is technically nonspecific although statistically likely to be benign. 3. 3.6 cm hypodense right adnexal structure, probably a cyst but technically nonspecific. If patient is premenopausal, no follow up is warranted; if early postmenopausal, follow up ultrasound in 6-12 months would be recommended. 4. Other imaging findings of potential clinical significance: Small type 1 hiatal hernia. Descending and sigmoid colon diverticulosis. Small indirect left inguinal hernia contains adipose tissue.   Aortic Atherosclerosis (ICD10-I70.0).   10/26/2019 Imaging   Whole Body Bone scan  10/26/19  IMPRESSION: Nonspecific sites of uptake at the RIGHT ischium and anterior LEFT sixth rib, cannot exclude metastatic foci.   Otherwise negative exam.     10/29/2019 - 04/04/2020 Chemotherapy   Adjuvant AC q2weeks for 4 cycles starting 10/29/19-12/21/19 followed by weekly Taxol for 12 weeks starting 01/11/20. Taxol reduced 30% starting with C6. Completed on 04/04/20   03/24/2020 Breast MRI   IMPRESSION: 1. Postsurgical changes in the right breast and right  axilla. 2. No MRI evidence of malignancy in either breast.   RECOMMENDATION: Diagnostic bilateral mammogram in September 2021.   BI-RADS CATEGORY  2: Benign.   04/21/2020 - 06/18/2020 Radiation Therapy   adjuvant Radiation in [facility] 04/21/20-06/18/20 with Marguerita Merles   12/2020 -  Anti-estrogen oral therapy   Letrozole 2.28m once daily starting 12/2020     CURRENT THERAPY: Letrozole po once daily, starting 12/2020  INTERVAL HISTORY: Ms. PHubblereturns for follow up as scheduled. Last seen by me 07/2021. Several appointments have been rescheduled lately. DEXA 11/22 was normal. Mammogram 11/19/21 was negative.    REVIEW OF SYSTEMS:   Constitutional: Denies fevers, chills or abnormal weight loss Eyes: Denies blurriness of vision Ears, nose, mouth, throat, and face: Denies mucositis or sore throat Respiratory: Denies cough, dyspnea or wheezes Cardiovascular: Denies palpitation, chest discomfort or lower extremity swelling Gastrointestinal:  Denies nausea, heartburn or change in bowel habits Skin: Denies abnormal skin rashes Lymphatics: Denies new lymphadenopathy or easy bruising Neurological:Denies numbness, tingling or new weaknesses Behavioral/Psych: Mood is stable, no new changes  All other systems were reviewed with the patient and are negative.  MEDICAL HISTORY:  Past Medical History:  Diagnosis Date   Anxiety    [place]   right breast cancer-had lumpectomy   Depression    DUB (dysfunctional uterine bleeding)    Family history of pancreatic cancer    Hypertension    Personal history of chemotherapy    Personal history of radiation therapy     SURGICAL HISTORY: Past Surgical History:  Procedure Laterality Date   BREAST LUMPECTOMY     BREAST LUMPECTOMY WITH AXILLARY LYMPH NODE BIOPSY Right 09/24/2019   Procedure: RIGHT BREAST LUMPECTOMY WITH SENTINEL LYMPH NODE BIOPSY;  Surgeon: TJovita Kussmaul MD;  [facility]  Service: General;   Laterality: Right;   BREAST SURGERY  09/24/2019   Breast lump excised   FOOT SURGERY     IR REMOVAL TUN ACCESS W/ PORT W/O FL MOD SED  09/15/2021   LAPAROSCOPIC GASTRIC BANDING     PORTACATH PLACEMENT N/A 10/25/2019   Procedure: INSERTION PORT-A-CATH WITH POSSIBLE ULTRASOUND;  Surgeon: TJovita Kussmaul MD;  Location: [facility];  Service: General;  Laterality: N/A;   WISDOM TOOTH EXTRACTION      I have reviewed the social history and family history with the patient and they are unchanged from previous note.  ALLERGIES:  is allergic to hydrochlorothiazide, codeine, and lisinopril.  MEDICATIONS:  Current Outpatient Medications  Medication Sig Dispense Refill   amLODipine (NORVASC) 10 MG tablet Take 10 mg by mouth daily.     Bioflavonoid Products (ESTER C PO) Take 1 tablet by mouth daily.     Calcium Carbonate-Vitamin D (CALCIUM + D PO) Take 1 tablet by mouth daily.      cetirizine (ZYRTEC) 10 MG tablet Take 10 mg by mouth daily as needed for allergies.     cholecalciferol (VITAMIN D) 25 MCG (1000 UT) tablet Take 1,000 Units by mouth daily.  citalopram (CELEXA) 20 MG tablet Take 20 mg by mouth daily.     gabapentin (NEURONTIN) 100 MG capsule TAKE 3 CAPSULES(300 MG) BY MOUTH AT BEDTIME 90 capsule 1   gabapentin (NEURONTIN) 300 MG capsule Take 1 capsule (300 mg total) by mouth at bedtime. 7 capsule 0   HYDROcodone-acetaminophen (NORCO/VICODIN) 5-325 MG tablet Take 1-2 tablets by mouth every 6 (six) hours as needed for moderate pain or severe pain. (Patient not taking: Reported on 02/29/2020) 10 tablet 0   letrozole (FEMARA) 2.5 MG tablet Take 1 tablet (2.5 mg total) by mouth daily. 90 tablet 3   letrozole (FEMARA) 2.5 MG tablet Take 1 tablet (2.5 mg total) by mouth daily. 7 tablet 0   letrozole (FEMARA) 2.5 MG tablet TAKE 1 TABLET(2.5 MG) BY MOUTH DAILY 90 tablet 1   LORazepam (ATIVAN) 0.5 MG tablet Take 1-2 tablets (0.5-1 mg total) by mouth every 8 (eight) hours as needed for anxiety (nausea).  20 tablet 0   losartan (COZAAR) 100 MG tablet Take 100 mg by mouth daily.     Multiple Vitamin (MULTIVITAMIN WITH MINERALS) TABS tablet Take 1 tablet by mouth daily.     promethazine (PHENERGAN) 25 MG tablet Take 1 tablet (25 mg total) by mouth every 6 (six) hours as needed for nausea or vomiting. (Patient not taking: Reported on 02/29/2020) 30 tablet 0   traZODone (DESYREL) 100 MG tablet TAKE 1 TABLET(100 MG) BY MOUTH AT BEDTIME 30 tablet 0   traZODone (DESYREL) 100 MG tablet Take 1 tablet (100 mg total) by mouth at bedtime. 7 tablet 0   No current facility-administered medications for this visit.    PHYSICAL EXAMINATION: ECOG PERFORMANCE STATUS: {CHL ONC ECOG PS:[id number]}  There were no vitals filed for this visit. There were no vitals filed for this visit.  GENERAL:alert, no distress and comfortable SKIN: skin color, texture, turgor are normal, no rashes or significant lesions EYES: normal, Conjunctiva are pink and non-injected, sclera clear OROPHARYNX:no exudate, no erythema and lips, buccal mucosa, and tongue normal  NECK: supple, thyroid normal size, non-tender, without nodularity LYMPH:  no palpable lymphadenopathy in the cervical, axillary or inguinal LUNGS: clear to auscultation and percussion with normal breathing effort HEART: regular rate & rhythm and no murmurs and no lower extremity edema ABDOMEN:abdomen soft, non-tender and normal bowel sounds Musculoskeletal:no cyanosis of digits and no clubbing  NEURO: alert & oriented x 3 with fluent speech, no focal motor/sensory deficits  LABORATORY DATA:  I have reviewed the data as listed    Latest Ref Rng & Units 08/06/2021    8:28 AM 04/06/2021    8:05 AM 06/27/2020    9:47 AM  CBC  WBC 4.0 - 10.5 K/uL 5.3  7.2  4.0   Hemoglobin 12.0 - 15.0 g/dL 12.0  12.3  11.3   Hematocrit 36.0 - 46.0 % 37.3  37.9  35.8   Platelets 150 - 400 K/uL 347  390  302         Latest Ref Rng & Units 08/06/2021    8:28 AM 04/06/2021    8:05  AM 06/27/2020    9:47 AM  CMP  Glucose 70 - 99 mg/dL 101  88  81   BUN 6 - 20 mg/dL 10  8  8    Creatinine 0.44 - 1.00 mg/dL 0.77  0.76  0.80   Sodium 135 - 145 mmol/L 140  141  140   Potassium 3.5 - 5.1 mmol/L 3.8  3.6  3.6   Chloride  98 - 111 mmol/L 104  104  106   CO2 22 - 32 mmol/L 25  27  24    Calcium 8.9 - 10.3 mg/dL 10.1  10.0  10.4   Total Protein 6.5 - 8.1 g/dL 8.1  8.2  7.5   Total Bilirubin 0.3 - 1.2 mg/dL 0.4  0.3  0.4   Alkaline Phos 38 - 126 U/L 96  91  68   AST 15 - 41 U/L 15  14  17    ALT 0 - 44 U/L 20  19  21        RADIOGRAPHIC STUDIES: I have personally reviewed the radiological images as listed and agreed with the findings in the report. No results found.   ASSESSMENT & PLAN:  No problem-specific Assessment & Plan notes found for this encounter.   No orders of the defined types were placed in this encounter.  All questions were answered. The patient knows to call the clinic with any problems, questions or concerns. No barriers to learning was detected. I spent {CHL ONC TIME VISIT - VHAWU:[id number]} counseling the patient face to face. The total time spent in the appointment was {CHL ONC TIME VISIT - VLRTJ:[id number]} and more than 50% was on counseling and review of test results     Alla Feeling, NP 06/13/22

## 2022-06-14 ENCOUNTER — Inpatient Hospital Stay: Payer: BC Managed Care – PPO

## 2022-06-14 ENCOUNTER — Inpatient Hospital Stay: Payer: BC Managed Care – PPO | Attending: Nurse Practitioner | Admitting: Nurse Practitioner

## 2022-06-14 ENCOUNTER — Other Ambulatory Visit: Payer: Self-pay

## 2022-06-14 ENCOUNTER — Telehealth: Payer: Self-pay | Admitting: Nurse Practitioner

## 2022-06-14 DIAGNOSIS — Z170 Estrogen receptor positive status [ER+]: Secondary | ICD-10-CM

## 2022-06-14 NOTE — Telephone Encounter (Signed)
R/s per 7/31 in basket, message has been left

## 2022-06-29 ENCOUNTER — Inpatient Hospital Stay: Payer: BC Managed Care – PPO | Attending: Nurse Practitioner

## 2022-06-29 ENCOUNTER — Other Ambulatory Visit: Payer: Self-pay

## 2022-06-29 ENCOUNTER — Inpatient Hospital Stay (HOSPITAL_BASED_OUTPATIENT_CLINIC_OR_DEPARTMENT_OTHER): Payer: BC Managed Care – PPO | Admitting: Nurse Practitioner

## 2022-06-29 ENCOUNTER — Encounter: Payer: Self-pay | Admitting: Nurse Practitioner

## 2022-06-29 VITALS — BP 145/76 | HR 73 | Temp 98.0°F | Resp 16 | Ht 66.0 in | Wt 306.5 lb

## 2022-06-29 DIAGNOSIS — C50411 Malignant neoplasm of upper-outer quadrant of right female breast: Secondary | ICD-10-CM

## 2022-06-29 DIAGNOSIS — E669 Obesity, unspecified: Secondary | ICD-10-CM | POA: Diagnosis not present

## 2022-06-29 DIAGNOSIS — F418 Other specified anxiety disorders: Secondary | ICD-10-CM | POA: Insufficient documentation

## 2022-06-29 DIAGNOSIS — Z170 Estrogen receptor positive status [ER+]: Secondary | ICD-10-CM

## 2022-06-29 DIAGNOSIS — I10 Essential (primary) hypertension: Secondary | ICD-10-CM | POA: Insufficient documentation

## 2022-06-29 DIAGNOSIS — D649 Anemia, unspecified: Secondary | ICD-10-CM | POA: Diagnosis not present

## 2022-06-29 DIAGNOSIS — Z853 Personal history of malignant neoplasm of breast: Secondary | ICD-10-CM | POA: Diagnosis not present

## 2022-06-29 DIAGNOSIS — Z800 Family history of malignant neoplasm of digestive organs: Secondary | ICD-10-CM | POA: Diagnosis not present

## 2022-06-29 DIAGNOSIS — Z923 Personal history of irradiation: Secondary | ICD-10-CM | POA: Insufficient documentation

## 2022-06-29 LAB — CBC WITH DIFFERENTIAL/PLATELET
Abs Immature Granulocytes: 0.02 10*3/uL (ref 0.00–0.07)
Basophils Absolute: 0.1 10*3/uL (ref 0.0–0.1)
Basophils Relative: 1 %
Eosinophils Absolute: 0.3 10*3/uL (ref 0.0–0.5)
Eosinophils Relative: 5 %
HCT: 35.2 % — ABNORMAL LOW (ref 36.0–46.0)
Hemoglobin: 11.7 g/dL — ABNORMAL LOW (ref 12.0–15.0)
Immature Granulocytes: 0 %
Lymphocytes Relative: 28 %
Lymphs Abs: 1.7 10*3/uL (ref 0.7–4.0)
MCH: 26.1 pg (ref 26.0–34.0)
MCHC: 33.2 g/dL (ref 30.0–36.0)
MCV: 78.6 fL — ABNORMAL LOW (ref 80.0–100.0)
Monocytes Absolute: 0.7 10*3/uL (ref 0.1–1.0)
Monocytes Relative: 11 %
Neutro Abs: 3.4 10*3/uL (ref 1.7–7.7)
Neutrophils Relative %: 55 %
Platelets: 429 10*3/uL — ABNORMAL HIGH (ref 150–400)
RBC: 4.48 MIL/uL (ref 3.87–5.11)
RDW: 16.2 % — ABNORMAL HIGH (ref 11.5–15.5)
WBC: 6.1 10*3/uL (ref 4.0–10.5)
nRBC: 0 % (ref 0.0–0.2)

## 2022-06-29 LAB — COMPREHENSIVE METABOLIC PANEL
ALT: 20 U/L (ref 0–44)
AST: 15 U/L (ref 15–41)
Albumin: 3.9 g/dL (ref 3.5–5.0)
Alkaline Phosphatase: 82 U/L (ref 38–126)
Anion gap: 6 (ref 5–15)
BUN: 9 mg/dL (ref 6–20)
CO2: 27 mmol/L (ref 22–32)
Calcium: 9.7 mg/dL (ref 8.9–10.3)
Chloride: 107 mmol/L (ref 98–111)
Creatinine, Ser: 0.8 mg/dL (ref 0.44–1.00)
GFR, Estimated: >60 mL/min (ref >60)
Glucose, Bld: 102 mg/dL — ABNORMAL HIGH (ref 70–99)
Potassium: 4 mmol/L (ref 3.5–5.1)
Sodium: 140 mmol/L (ref 135–145)
Total Bilirubin: 0.3 mg/dL (ref 0.3–1.2)
Total Protein: 7.9 g/dL (ref 6.5–8.1)

## 2022-06-29 MED ORDER — LETROZOLE 2.5 MG PO TABS: ORAL_TABLET | ORAL | 3 refills | Status: DC

## 2022-06-29 MED ORDER — GABAPENTIN 100 MG PO CAPS: ORAL_CAPSULE | ORAL | 1 refills | Status: DC

## 2022-06-29 NOTE — Progress Notes (Signed)
[place]   [phone] [phone]   Clinic Follow up Note   Patient Care Team: Burnett Sheng, MD as PCP - General (Family Medicine) Mauro Kaufmann, RN as Oncology Nurse Navigator Rockwell Germany, RN as Oncology Nurse Navigator Jovita Kussmaul, MD as Consulting Physician (General Surgery) Truitt Merle, MD as Consulting Physician (Hematology) Eppie Gibson, MD as Attending Physician (Radiation Oncology) Leanna Sato, MD as Referring Physician Alla Feeling, NP as Nurse Practitioner (Nurse Practitioner) 06/29/2022  CHIEF COMPLAINT: Follow-up right breast cancer  SUMMARY OF ONCOLOGIC HISTORY: Oncology History Overview Note  Cancer Staging Malignant neoplasm of upper-outer quadrant of right breast in female, estrogen receptor positive ([place]) Staging form: Breast, AJCC 8th Edition - Clinical stage from 08/28/2019: Stage IB (cT2, cN0, cM0, G2, ER+, PR+, HER2-) - Signed by Truitt Merle, MD on 09/04/2019 - Pathologic stage from 09/24/2019: Stage IB (pT2, pN1a, cM0, G2, ER+, PR+, HER2-) - Signed by Truitt Merle, MD on 10/10/2019    Malignant neoplasm of upper-outer quadrant of right breast in female, estrogen receptor positive ([place])  08/24/2019 Mammogram   Diagnostic mammogram and [place] 08/24/19 IMPRESSION: Suspicious right breast mass 10 o'clock 1 cm from the nipple measuring 3.3 x 1.9 x 1.9 cm and axillary adenopathy.   08/28/2019 Initial Biopsy   Diagnosis 08/28/19 1. Breast, right, needle core biopsy, 10 o'clock - INVASIVE MAMMARY CARCINOMA, GRADE II. - SEE MICROSCOPIC DESCRIPTION. 2. Lymph node, needle/core biopsy, right axilla - BENIGN LYMPH NODE. - NO METASTATIC CARCINOMA IDENTIFIED.    08/28/2019 Receptors her2   Results: IMMUNOHISTOCHEMICAL AND MORPHOMETRIC ANALYSIS PERFORMED MANUALLY The tumor cells are NEGATIVE for Her2 (1+). Estrogen Receptor: 100%, POSITIVE, STRONG STAINING INTENSITY Progesterone Receptor: 100%, POSITIVE,  STRONG STAINING INTENSITY Proliferation Marker Ki67: 10%   08/28/2019 Cancer Staging   Staging form: Breast, AJCC 8th Edition - Clinical stage from 08/28/2019: Stage IB (cT2, cN0, cM0, G2, ER+, PR+, HER2-) - Signed by Truitt Merle, MD on 09/04/2019   08/31/2019 Initial Diagnosis   Malignant neoplasm of upper-outer quadrant of right breast in female, estrogen receptor positive ([place])   09/07/2019 Breast MRI   Breast MRI 09/07/19  IMPRESSION: 1. 2.5 cm known malignancy in the anterior right breast, superficial depth near the nipple. 2. 7-8 mm indeterminate mass in the central upper outer quadrant of the right breast. 3. One right axillary lymph node with apparent mild cortical thickening, which lies slightly anterior and inferior to the biopsied right axillary lymph node ( benign reactive on pathology). This is most likely also benign given the pathology from the adjacent biopsied lymph node. 4. No evidence of left breast malignancy.   09/17/2019 Imaging   [place] right breast 09/17/19  IMPRESSION: Several benign appearing masses in the upper outer quadrant of the right breast without a definite correlate to the finding seen on prior MRI. The right axillary lymph nodes appear similar to the recently biopsied benign lymph node.   09/20/2019 Pathology Results   Diagnosis 09/20/19  Breast, right, needle core biopsy, upper outer quadrant - FIBROADENOMA - NO MALIGNANCY IDENTIFIED    09/21/2019 Genetic Testing   Negative genetic testing:  No pathogenic variants detected on the [facility] Breast Cancer STAT panel and Common Hereditary Cancers panel.  A variant of uncertain significance was identified in the BRCA1 gene, called c.2048A>G ([id number]).  The report date is 09/21/2019.  The STAT Breast cancer panel offered by [facility] includes sequencing and rearrangement analysis for the following 9 genes:  ATM, BRCA1, BRCA2,  CDH1, CHEK2, PALB2, PTEN, STK11 and TP53.   The Common Hereditary Cancers Panel  offered by [facility] includes sequencing and/or deletion duplication testing of the following 48 genes: APC, ATM, AXIN2, BARD1, BMPR1A, BRCA1, BRCA2, BRIP1, CDH1, CDK4, CDKN2A (p14ARF), CDKN2A (p16INK4a), CHEK2, CTNNA1, DICER1, EPCAM (Deletion/duplication testing only), GREM1 (promoter region deletion/duplication testing only), KIT, MEN1, MLH1, MSH2, MSH3, MSH6, MUTYH, NBN, NF1, NHTL1, PALB2, PDGFRA, PMS2, POLD1, POLE, PTEN, RAD50, RAD51C, RAD51D, RNF43, SDHB, SDHC, SDHD, SMAD4, SMARCA4. STK11, TP53, TSC1, TSC2, and VHL.  The following genes were evaluated for sequence changes only: SDHA and HOXB13 c.251G>A variant only.    09/24/2019 Surgery   RIGHT BREAST LUMPECTOMY WITH SENTINEL LYMPH NODE BIOPSY by Dr Marlou Starks  09/24/19    09/24/2019 Pathology Results   FINAL MICROSCOPIC DIAGNOSIS: 09/24/19   A. LYMPH NODE, RIGHT #1, SENTINEL, BIOPSY:  - There is no evidence of carcinoma in 1 of 1 lymph node (0/1).   B. LYMPH NODE, RIGHT, SENTINEL, BIOPSY:  - There is no evidence of carcinoma in 1 of 1 lymph node (0/1).   C. 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   D. LYMPH NODE, RIGHT #2, SENTINEL, BIOPSY:  - There is no evidence of carcinoma in 1 of 1 lymph node (0/1).   E. LYMPH NODE, RIGHT #3 , SENTINEL, BIOPSY:  - There is no evidence of carcinoma in 1 of 1 lymph node (0/1).   F. LYMPH NODE, RIGHT, SENTINEL, BIOPSY:  - There is no evidence of carcinoma in 1 of 1 lymph node (0/1).   G. LYMPH NODE, RIGHT, SENTINEL, BIOPSY:  -There is no evidence of carcinoma in 1 of 1 lymph node (0/1).   H. 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  - Ductal carcinoma in situ, intermediate grade.  - Perineural invasion is identified.  - The surgical resection margins are negative for carcinoma.  - See oncology table below   I. BREAST, RIGHT ADDITIONAL INFERIOR MARGIN, EXCISION:  - Fibrocystic changes.  - There is no  evidence of malignancy.     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 Postoperative findings in the right breast and axilla. Upper normal sized right axillary lymph nodes, nonspecific. 2. 2 mm superior apical segment right upper lobe nodule is technically nonspecific although statistically likely to be benign. 3. 3.6 cm hypodense right adnexal structure, probably a cyst but technically nonspecific. If patient is premenopausal, no follow up is warranted; if early postmenopausal, follow up ultrasound in 6-12 months would be recommended. 4. Other imaging findings of potential clinical significance: Small type 1 hiatal hernia. Descending and sigmoid colon diverticulosis. Small indirect left inguinal hernia contains adipose tissue.   Aortic Atherosclerosis (ICD10-I70.0).   10/26/2019 Imaging   Whole Body Bone scan 10/26/19  IMPRESSION: Nonspecific sites of uptake at the RIGHT ischium and anterior LEFT sixth rib, cannot exclude metastatic foci.   Otherwise negative exam.     10/29/2019 - 04/04/2020 Chemotherapy   Adjuvant AC q2weeks for 4 cycles starting 10/29/19-12/21/19 followed by weekly Taxol for 12 weeks starting 01/11/20. Taxol reduced 30% starting with C6. Completed on 04/04/20   03/24/2020 Breast MRI   IMPRESSION: 1. Postsurgical changes in the right breast and right  axilla. 2. No MRI evidence of malignancy in either breast.   RECOMMENDATION: Diagnostic bilateral mammogram in September 2021.   BI-RADS CATEGORY  2: Benign.   04/21/2020 - 06/18/2020 Radiation Therapy   adjuvant Radiation in [facility] 04/21/20-06/18/20 with Marguerita Merles   12/2020 -  Anti-estrogen oral therapy   Letrozole 2.5m once daily starting 12/2020     CURRENT THERAPY: Letrozole 2.5 mg once daily starting 12/2020  INTERVAL HISTORY: Ms. PLepplareturns for follow-up as scheduled, last seen by me 08/06/2021 then lost follow-up.  She got tired of so many medical appointments and needed a break away.  He has been working on herself however, got her first colonoscopy and has decided to proceed with bariatric surgery.  She had her Port-A-Cath removed 09/15/2021.  DEXA 10/01/2021 showed normal bone density.  Mammogram 11/19/2021 showed breast density category D, otherwise negative.    She is doing well with no specific complaints.  Continues letrozole.  Gabapentin helps her sleep and manages hot flashes.  Denies new breast lump/mass, nipple discharge or inversion, or skin change.  She recently had dental surgery due to bone loss, stitches were removed yesterday and she completed prednisone.  All other systems were reviewed with the patient and are negative.  MEDICAL HISTORY:  Past Medical History:  Diagnosis Date   Anxiety    Cancer ([facility] 09/24/2019   right breast cancer-had lumpectomy   Depression    DUB (dysfunctional uterine bleeding)    Family history of pancreatic cancer    Hypertension    Personal history of chemotherapy    Personal history of radiation therapy     SURGICAL HISTORY: Past Surgical History:  Procedure Laterality Date   BREAST LUMPECTOMY     BREAST LUMPECTOMY WITH AXILLARY LYMPH NODE BIOPSY Right 09/24/2019   Procedure: RIGHT BREAST LUMPECTOMY WITH SENTINEL LYMPH NODE BIOPSY;  Surgeon: TJovita Kussmaul MD;  [facility]  Service: General;   Laterality: Right;   BREAST SURGERY  09/24/2019   Breast lump excised   FOOT SURGERY     IR REMOVAL TUN ACCESS W/ PORT W/O FL MOD SED  09/15/2021   LAPAROSCOPIC GASTRIC BANDING     PORTACATH PLACEMENT N/A 10/25/2019   Procedure: INSERTION PORT-A-CATH WITH POSSIBLE ULTRASOUND;  Surgeon: TJovita Kussmaul MD;  Location: [facility];  Service: General;  Laterality: N/A;   WISDOM TOOTH EXTRACTION      I have reviewed the social history and family history with the patient and they are unchanged from previous note.  ALLERGIES:  is allergic to hydrochlorothiazide, codeine, and lisinopril.  MEDICATIONS:  Current Outpatient Medications  Medication Sig Dispense Refill   amLODipine (NORVASC) 10 MG tablet Take 1 tablet by mouth daily.     amLODipine (NORVASC) 10 MG tablet Take 10 mg by mouth daily.     Bioflavonoid Products (ESTER C PO) Take 1 tablet by mouth daily.     Calcium Carbonate-Vitamin D (CALCIUM + D PO) Take 1 tablet by mouth daily.      cetirizine (ZYRTEC) 10 MG tablet  Take 10 mg by mouth daily as needed for allergies.     cholecalciferol (VITAMIN D) 25 MCG (1000 UT) tablet Take 1,000 Units by mouth daily.     citalopram (CELEXA) 20 MG tablet Take 20 mg by mouth daily.     gabapentin (NEURONTIN) 100 MG capsule TAKE 3 CAPSULES(300 MG) BY MOUTH AT BEDTIME 90 capsule 1   letrozole (FEMARA) 2.5 MG tablet TAKE 1 TABLET(2.5 MG) BY MOUTH DAILY 90 tablet 3   losartan (COZAAR) 100 MG tablet Take 100 mg by mouth daily.     Multiple Vitamin (MULTIVITAMIN WITH MINERALS) TABS tablet Take 1 tablet by mouth daily.     promethazine (PHENERGAN) 25 MG tablet Take 1 tablet (25 mg total) by mouth every 6 (six) hours as needed for nausea or vomiting. (Patient not taking: Reported on 02/29/2020) 30 tablet 0   traZODone (DESYREL) 100 MG tablet TAKE 1 TABLET(100 MG) BY MOUTH AT BEDTIME 30 tablet 0   traZODone (DESYREL) 100 MG tablet Take 1 tablet (100 mg total) by mouth at bedtime. 7 tablet 0   No current  facility-administered medications for this visit.    PHYSICAL EXAMINATION: ECOG PERFORMANCE STATUS: 0 - Asymptomatic  Vitals:   06/29/22 0959  BP: (!) 145/76  Pulse: 73  Resp: 16  Temp: 98 F (36.7 C)  SpO2: 100%   Filed Weights   06/29/22 0959  Weight: (!) 306 lb 8 oz (139 kg)    GENERAL:alert, no distress and comfortable SKIN: skin color, texture, turgor are normal, no rashes or significant lesions EYES: normal, Conjunctiva are pink and non-injected, sclera clear OROPHARYNX:no exudate, no erythema and lips, buccal mucosa, and tongue normal  NECK: supple, thyroid normal size, non-tender, without nodularity LYMPH:  no palpable lymphadenopathy in the cervical, axillary or inguinal LUNGS: clear to auscultation and percussion with normal breathing effort HEART: regular rate & rhythm and no murmurs and no lower extremity edema ABDOMEN:abdomen soft, non-tender and normal bowel sounds Musculoskeletal:no cyanosis of digits and no clubbing  NEURO: alert & oriented x 3 with fluent speech, no focal motor/sensory deficits  LABORATORY DATA:  I have reviewed the data as listed    Latest Ref Rng & Units 06/29/2022    9:49 AM 08/06/2021    8:28 AM 04/06/2021    8:05 AM  CBC  WBC 4.0 - 10.5 K/uL 6.1  5.3  7.2   Hemoglobin 12.0 - 15.0 g/dL 11.7  12.0  12.3   Hematocrit 36.0 - 46.0 % 35.2  37.3  37.9   Platelets 150 - 400 K/uL 429  347  390         Latest Ref Rng & Units 08/06/2021    8:28 AM 04/06/2021    8:05 AM 06/27/2020    9:47 AM  CMP  Glucose 70 - 99 mg/dL 101  88  81   BUN 6 - 20 mg/dL 10  8  8    Creatinine 0.44 - 1.00 mg/dL 0.77  0.76  0.80   Sodium 135 - 145 mmol/L 140  141  140   Potassium 3.5 - 5.1 mmol/L 3.8  3.6  3.6   Chloride 98 - 111 mmol/L 104  104  106   CO2 22 - 32 mmol/L 25  27  24    Calcium 8.9 - 10.3 mg/dL 10.1  10.0  10.4   Total Protein 6.5 - 8.1 g/dL 8.1  8.2  7.5   Total Bilirubin 0.3 - 1.2 mg/dL 0.4  0.3  0.4  Alkaline Phos 38 - 126 U/L 96  91  68    AST 15 - 41 U/L 15  14  17    ALT 0 - 44 U/L 20  19  21        RADIOGRAPHIC STUDIES: I have personally reviewed the radiological images as listed and agreed with the findings in the report. No results found.   ASSESSMENT & PLAN: Paula Davidson is a 51 y.o. female with      1 Malignant neoplasm of upper-outer quadrant of right breast, pT2N1aM0, stage IB, ER+/PR+, HER2-, Grade II -Diagnosed in 08/2019. S/p right lumpectomy and SLNB with Dr. Marlou Starks on 09/24/19. 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  -To reduce her high risk she completed adjuvant chemo AC-T chemo on 04/04/20 and adjuvant Radiation in [facility] 04/21/20-06/18/20.  Port removed 09/2021. -She started antiestrogen therapy with Letrozole from 12/2020. She has tolerated moderately well with weight gain and hot flashes -Ms. Vien is clinically doing well.  Tolerating letrozole with stable hot flashes, well managed on gabapentin.  Breast exam is benign, labs are stable.  Mammogram 11/19/21 is negative.  Overall no clinical concern for breast cancer recurrence. -She is nearing 3 years from initial diagnosis, the recurrence risk has decreased.  Continue letrozole and surveillance.  She agrees to proceed with screening breast MRI which we will do this month, then next mammo 11/2022 -DEXA 09/2021 was normal, she takes a multivitamin and vitamin D, repeat every 2 years -Courage her to continue healthy active lifestyle and staying up-to-date on age-appropriate health maintenance and cancer screenings.  Colonoscopy done 11/2021 -Follow-up in 6 months, or sooner if needed   2. Neuropathy G1, and hot flashes from letrozole -Secondary to Taxol, s/p C5.  Neuropathy has resolved -Hot flashes well managed on gabapentin     3. Genetic Testing negative for pathogenetic mutations -She does have VUS of BRCA1 c.2048A>G      4. HTN, Depression, Anxiety, obesity, insomnia  -Continue regimen per PCP -Tried  melatonin and amitriptyline for insomnia with little success.  Currently on gabapentin for hot flashes and trazodone -On Letrozole she has gained more weight. [facility]  -She is ultimately decided to proceed with bariatric surgery through [facility] health, will complete clearance form -Weight stable over the past year   5. Anemia  -Has been secondary to menorrhagia in the past, then chemo -resolved briefly in 2022 -Today's CBC shows mild recurrent anemia Hgb 11.7, MCV 78, platelet 429.  Labs from care everywhere 05/14/2022 showed normal ferritin, B12, and folate.  Hypothyroid, and vitamin D insufficiency.  The plan is to repeat TSH and she is taking vitamin D now -Possibly from recent oral surgery and steroids -She understands after bariatric surgery she will need to continue monitoring to ensure no nutritional anemias   PLAN: -Reviewed labs, mammo, Dexa -continue breast cancer surveillance and letrozole, refilled -Screening breast MRI this month, will call with results -B/l mammo 11/2022 -will complete clearance form for bariatric surgery and fax to [facility] -F/up in 6 months, or sooner if needed   Orders Placed This Encounter  Procedures   MR BREAST BILATERAL [facility] [place] CAD    Standing Status:   Future    Standing Expiration Date:   06/30/2023    Order Specific Question:   If indicated for the ordered procedure, I authorize the administration of contrast  media per Radiology protocol    Answer:   Yes    Order Specific Question:   What is the patient's sedation requirement?    Answer:   No Sedation    Order Specific Question:   Does the patient have a pacemaker or implanted devices?    Answer:   No    Order Specific Question:   Preferred imaging location?    Answer:   [facility] (table limit-550lbs)   MM DIAG BREAST TOMO BILATERAL    Standing Status:   Future    Standing Expiration Date:    06/30/2023    Order Specific Question:   Reason for Exam (SYMPTOM  OR DIAGNOSIS REQUIRED)    Answer:   h/o right breast cancer 08/2019    Order Specific Question:   Is the patient pregnant?    Answer:   No    Order Specific Question:   Preferred imaging location?    Answer:   [facility]   All questions were answered. The patient knows to call the clinic with any problems, questions or concerns. No barriers to learning was detected. I spent 20 minutes counseling the patient face to face. The total time spent in the appointment was 30 minutes and more than 50% was on counseling and review of test results.     Alla Feeling, NP 06/29/22

## 2022-06-30 ENCOUNTER — Telehealth: Payer: Self-pay | Admitting: Nurse Practitioner

## 2022-06-30 NOTE — Telephone Encounter (Signed)
Left message with follow-up appointment per 8/15 los.

## 2022-07-08 ENCOUNTER — Telehealth: Payer: Self-pay | Admitting: *Deleted

## 2022-07-08 NOTE — Telephone Encounter (Signed)
Signed Surgical Clearance faxed to [place] - fax confirmation received

## 2022-08-23 ENCOUNTER — Other Ambulatory Visit: Payer: Self-pay | Admitting: Hematology

## 2022-08-24 ENCOUNTER — Encounter: Payer: Self-pay | Admitting: Hematology

## 2022-11-25 ENCOUNTER — Ambulatory Visit
Admission: RE | Admit: 2022-11-25 | Discharge: 2022-11-25 | Disposition: A | Discharge status: Home / Self Care | Payer: BC Managed Care – PPO | Source: Ambulatory Visit | Attending: Nurse Practitioner | Admitting: Nurse Practitioner

## 2022-11-25 DIAGNOSIS — Z170 Estrogen receptor positive status [ER+]: Secondary | ICD-10-CM

## 2022-11-25 MED ORDER — GADOPICLENOL 0.5 MMOL/ML IV SOLN
10.0000 mL | Freq: Once | INTRAVENOUS | Status: AC | PRN
  Administered 2022-11-25: 10 mL via INTRAVENOUS

## 2022-12-29 NOTE — Progress Notes (Unsigned)
Patient Care Team: Burnett Sheng, MD as PCP - General (Family Medicine) Mauro Kaufmann, RN as Oncology Nurse Navigator Rockwell Germany, RN as Oncology Nurse Navigator Jovita Kussmaul, MD as Consulting Physician (General Surgery) Truitt Merle, MD as Consulting Physician (Hematology) Eppie Gibson, MD as Attending Physician (Radiation Oncology) Leanna Sato, MD as Referring Physician Alla Feeling, NP as Nurse Practitioner (Nurse Practitioner)   CHIEF COMPLAINT: Follow up right breast cancer   Oncology History Overview Note  Cancer Staging Malignant neoplasm of upper-outer quadrant of right breast in female, estrogen receptor positive [facility]) Staging form: Breast, AJCC 8th Edition - Clinical stage from 08/28/2019: Stage IB (cT2, cN0, cM0, G2, ER+, PR+, HER2-) - Signed by Truitt Merle, MD on 09/04/2019 - Pathologic stage from 09/24/2019: Stage IB (pT2, pN1a, cM0, G2, ER+, PR+, HER2-) - Signed by Truitt Merle, MD on 10/10/2019    Malignant neoplasm of upper-outer quadrant of right breast in female, estrogen receptor positive ([place])  08/24/2019 Mammogram   Diagnostic mammogram and [place] 08/24/19 IMPRESSION: Suspicious right breast mass 10 o'clock 1 cm from the nipple measuring 3.3 x 1.9 x 1.9 cm and axillary adenopathy.   08/28/2019 Initial Biopsy   Diagnosis 08/28/19 1. Breast, right, needle core biopsy, 10 o'clock - INVASIVE MAMMARY CARCINOMA, GRADE II. - SEE MICROSCOPIC DESCRIPTION. 2. Lymph node, needle/core biopsy, right axilla - BENIGN LYMPH NODE. - NO METASTATIC CARCINOMA IDENTIFIED.    08/28/2019 Receptors her2   Results: IMMUNOHISTOCHEMICAL AND MORPHOMETRIC ANALYSIS PERFORMED MANUALLY The tumor cells are NEGATIVE for Her2 (1+). Estrogen Receptor: 100%, POSITIVE, STRONG STAINING INTENSITY Progesterone Receptor: 100%, POSITIVE, STRONG STAINING INTENSITY Proliferation Marker Ki67: 10%   08/28/2019 Cancer Staging   Staging form: Breast, AJCC 8th Edition - Clinical  stage from 08/28/2019: Stage IB (cT2, cN0, cM0, G2, ER+, PR+, HER2-) - Signed by Truitt Merle, MD on 09/04/2019   08/31/2019 Initial Diagnosis   Malignant neoplasm of upper-outer quadrant of right breast in female, estrogen receptor positive ([place])   09/07/2019 Breast MRI   Breast MRI 09/07/19  IMPRESSION: 1. 2.5 cm known malignancy in the anterior right breast, superficial depth near the nipple. 2. 7-8 mm indeterminate mass in the central upper outer quadrant of the right breast. 3. One right axillary lymph node with apparent mild cortical thickening, which lies slightly anterior and inferior to the biopsied right axillary lymph node ( benign reactive on pathology). This is most likely also benign given the pathology from the adjacent biopsied lymph node. 4. No evidence of left breast malignancy.   09/17/2019 Imaging   [place] right breast 09/17/19  IMPRESSION: Several benign appearing masses in the upper outer quadrant of the right breast without a definite correlate to the finding seen on prior MRI. The right axillary lymph nodes appear similar to the recently biopsied benign lymph node.   09/20/2019 Pathology Results   Diagnosis 09/20/19  Breast, right, needle core biopsy, upper outer quadrant - FIBROADENOMA - NO MALIGNANCY IDENTIFIED    09/21/2019 Genetic Testing   Negative genetic testing:  No pathogenic variants detected on the [facility] Breast Cancer STAT panel and Common Hereditary Cancers panel.  A variant of uncertain significance was identified in the BRCA1 gene, called c.2048A>G ([id number]).  The report date is 09/21/2019.  The STAT Breast cancer panel offered by [facility] includes sequencing and rearrangement analysis for the following 9 genes:  ATM, BRCA1, BRCA2, CDH1, CHEK2, PALB2, PTEN, STK11 and TP53.   The Common Hereditary Cancers Panel offered by [facility] includes  sequencing and/or deletion duplication testing of the following 48 genes: APC, ATM, AXIN2, BARD1, BMPR1A, BRCA1,  BRCA2, BRIP1, CDH1, CDK4, CDKN2A (p14ARF), CDKN2A (p16INK4a), CHEK2, CTNNA1, DICER1, EPCAM (Deletion/duplication testing only), GREM1 (promoter region deletion/duplication testing only), KIT, MEN1, MLH1, MSH2, MSH3, MSH6, MUTYH, NBN, NF1, NHTL1, PALB2, PDGFRA, PMS2, POLD1, POLE, PTEN, RAD50, RAD51C, RAD51D, RNF43, SDHB, SDHC, SDHD, SMAD4, SMARCA4. STK11, TP53, TSC1, TSC2, and VHL.  The following genes were evaluated for sequence changes only: SDHA and HOXB13 c.251G>A variant only.    09/24/2019 Surgery   RIGHT BREAST LUMPECTOMY WITH SENTINEL LYMPH NODE BIOPSY by Dr Marlou Starks  09/24/19    09/24/2019 Pathology Results   FINAL MICROSCOPIC DIAGNOSIS: 09/24/19   A. LYMPH NODE, RIGHT #1, SENTINEL, BIOPSY:  - There is no evidence of carcinoma in 1 of 1 lymph node (0/1).   B. LYMPH NODE, RIGHT, SENTINEL, BIOPSY:  - There is no evidence of carcinoma in 1 of 1 lymph node (0/1).   C. 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   D. LYMPH NODE, RIGHT #2, SENTINEL, BIOPSY:  - There is no evidence of carcinoma in 1 of 1 lymph node (0/1).   E. LYMPH NODE, RIGHT #3 , SENTINEL, BIOPSY:  - There is no evidence of carcinoma in 1 of 1 lymph node (0/1).   F. LYMPH NODE, RIGHT, SENTINEL, BIOPSY:  - There is no evidence of carcinoma in 1 of 1 lymph node (0/1).   G. LYMPH NODE, RIGHT, SENTINEL, BIOPSY:  -There is no evidence of carcinoma in 1 of 1 lymph node (0/1).   H. 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  - Ductal carcinoma in situ, intermediate grade.  - Perineural invasion is identified.  - The surgical resection margins are negative for carcinoma.  - See oncology table below   I. BREAST, RIGHT ADDITIONAL INFERIOR MARGIN, EXCISION:  - Fibrocystic changes.  - There is no evidence of malignancy.     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 Postoperative findings in the right breast and axilla. Upper normal sized right axillary lymph nodes, nonspecific. 2. 2 mm superior apical segment right upper lobe nodule is technically nonspecific although statistically likely to be benign. 3. 3.6 cm hypodense right adnexal structure, probably a cyst but technically nonspecific. If patient is premenopausal, no follow up is warranted; if early postmenopausal, follow up ultrasound in 6-12 months would be recommended. 4. Other imaging findings of potential clinical significance: Small type 1 hiatal hernia. Descending and sigmoid colon diverticulosis. Small indirect left inguinal hernia contains adipose tissue.   Aortic Atherosclerosis (ICD10-I70.0).   10/26/2019 Imaging   Whole Body Bone scan 10/26/19  IMPRESSION: Nonspecific sites of uptake at the RIGHT ischium and anterior LEFT sixth rib, cannot exclude metastatic foci.  Otherwise negative exam.     10/29/2019 - 04/04/2020 Chemotherapy   Adjuvant AC q2weeks for 4 cycles starting 10/29/19-12/21/19 followed by weekly Taxol for 12 weeks starting 01/11/20. Taxol reduced 30% starting with C6. Completed on 04/04/20   03/24/2020 Breast MRI   IMPRESSION: 1. Postsurgical changes in the right breast and right axilla. 2. No MRI evidence of malignancy in either breast.   RECOMMENDATION: Diagnostic bilateral mammogram in September 2021.   BI-RADS  CATEGORY  2: Benign.   04/21/2020 - 06/18/2020 Radiation Therapy   adjuvant Radiation in [facility] 04/21/20-06/18/20 with Marguerita Merles   12/2020 -  Anti-estrogen oral therapy   Letrozole 2.5mg  once daily starting 12/2020      CURRENT THERAPY: Letrozole 2.5 mg once daily starting 12/2020   INTERVAL HISTORY Paula Davidson returns for follow up as scheduled, last seen by me 06/29/22. MRI 11/25/22 was negative. She is scheduled for mammogram 12/31/22.   ROS   Past Medical History:  Diagnosis Date   Anxiety    Cancer ([place]) 09/24/2019   right breast cancer-had lumpectomy   Depression    DUB (dysfunctional uterine bleeding)    Family history of pancreatic cancer    Hypertension    Personal history of chemotherapy    Personal history of radiation therapy      Past Surgical History:  Procedure Laterality Date   BREAST LUMPECTOMY     BREAST LUMPECTOMY WITH AXILLARY LYMPH NODE BIOPSY Right 09/24/2019   Procedure: RIGHT BREAST LUMPECTOMY WITH SENTINEL LYMPH NODE BIOPSY;  Surgeon: Jovita Kussmaul, MD;  Location: [facility];  Service: General;  Laterality: Right;   BREAST SURGERY  09/24/2019   Breast lump excised   FOOT SURGERY     IR REMOVAL TUN ACCESS W/ PORT W/O FL MOD SED  09/15/2021   LAPAROSCOPIC GASTRIC BANDING     PORTACATH PLACEMENT N/A 10/25/2019   Procedure: INSERTION PORT-A-CATH WITH POSSIBLE ULTRASOUND;  Surgeon: Jovita Kussmaul, MD;  Location: [facility];  Service: General;  Laterality: N/A;   WISDOM TOOTH EXTRACTION       Outpatient Encounter Medications as of 12/30/2022  Medication Sig Note   amLODipine (NORVASC) 10 MG tablet Take 10 mg by mouth daily.    amLODipine (NORVASC) 10 MG tablet Take 1 tablet by mouth daily.    Bioflavonoid Products (ESTER C PO) Take 1 tablet by mouth daily.    Calcium Carbonate-Vitamin D (CALCIUM + D PO) Take 1 tablet by mouth daily.  09/15/2021: No longer taking   cetirizine (ZYRTEC) 10 MG tablet Take 10 mg by mouth daily as needed for allergies.     cholecalciferol (VITAMIN D) 25 MCG (1000 UT) tablet Take 1,000 Units by mouth daily. 09/15/2021: No longer taking   citalopram (CELEXA) 20 MG tablet Take 20 mg by mouth daily.    gabapentin (NEURONTIN) 100 MG capsule TAKE 3 CAPSULES(300 MG) BY MOUTH AT BEDTIME    letrozole (FEMARA) 2.5 MG tablet TAKE 1 TABLET(2.5 MG) BY MOUTH DAILY    losartan (COZAAR) 100 MG tablet Take 100 mg by mouth daily.    Multiple Vitamin (MULTIVITAMIN WITH MINERALS) TABS tablet Take 1 tablet by mouth daily.    promethazine (PHENERGAN) 25 MG tablet Take 1 tablet (25 mg total) by mouth every 6 (six) hours as needed for nausea or vomiting. (Patient not taking: Reported on 02/29/2020)    traZODone (DESYREL) 100 MG tablet TAKE 1 TABLET(100 MG) BY MOUTH AT BEDTIME    traZODone (DESYREL) 100 MG tablet Take 1  tablet (100 mg total) by mouth at bedtime.    [DISCONTINUED] prochlorperazine (COMPAZINE) 10 MG tablet Take 1 tablet (10 mg total) by mouth every 6 (six) hours as needed (Nausea or vomiting).    No facility-administered encounter medications on file as of 12/30/2022.     There were no vitals filed for this visit. There is no height or weight on file to calculate BMI.   PHYSICAL EXAM GENERAL:alert, no distress and comfortable SKIN: no rash  EYES: sclera clear NECK: without mass LYMPH:  no palpable cervical or supraclavicular lymphadenopathy  LUNGS: clear with normal breathing effort HEART: regular rate & rhythm, no lower extremity edema ABDOMEN: abdomen soft, non-tender and normal bowel sounds NEURO: alert & oriented x 3 with fluent speech, no focal motor/sensory deficits Breast exam:  PAC without erythema    CBC    Component Value Date/Time   WBC 6.1 06/29/2022 0949   RBC 4.48 06/29/2022 0949   HGB 11.7 (L) 06/29/2022 0949   HGB 12.0 08/06/2021 0828   HCT 35.2 (L) 06/29/2022 0949   PLT 429 (H) 06/29/2022 0949   PLT 347 08/06/2021 0828   MCV 78.6 (L) 06/29/2022 0949   MCH 26.1 06/29/2022 0949   MCHC  33.2 06/29/2022 0949   RDW 16.2 (H) 06/29/2022 0949   LYMPHSABS 1.7 06/29/2022 0949   MONOABS 0.7 06/29/2022 0949   EOSABS 0.3 06/29/2022 0949   BASOSABS 0.1 06/29/2022 0949     CMP     Component Value Date/Time   NA 140 06/29/2022 0949   K 4.0 06/29/2022 0949   CL 107 06/29/2022 0949   CO2 27 06/29/2022 0949   GLUCOSE 102 (H) 06/29/2022 0949   BUN 9 06/29/2022 0949   CREATININE 0.80 06/29/2022 0949   CREATININE 0.77 08/06/2021 0828   CALCIUM 9.7 06/29/2022 0949   PROT 7.9 06/29/2022 0949   ALBUMIN 3.9 06/29/2022 0949   AST 15 06/29/2022 0949   AST 15 08/06/2021 0828   ALT 20 06/29/2022 0949   ALT 20 08/06/2021 0828   ALKPHOS 82 06/29/2022 0949   BILITOT 0.3 06/29/2022 0949   BILITOT 0.4 08/06/2021 0828   GFRNONAA >60 06/29/2022 0949   GFRNONAA >60 08/06/2021 0828   GFRAA >60 06/27/2020 0947     ASSESSMENT & PLAN:  PLAN:  No orders of the defined types were placed in this encounter.     All questions were answered. The patient knows to call the clinic with any problems, questions or concerns. No barriers to learning were detected. I spent *** counseling the patient face to face. The total time spent in the appointment was *** and more than 50% was on counseling, review of test results, and coordination of care.   Paula Rue, NP-C @DATE @

## 2022-12-30 ENCOUNTER — Encounter: Payer: Self-pay | Admitting: Nurse Practitioner

## 2022-12-30 ENCOUNTER — Encounter: Payer: Self-pay | Admitting: Hematology

## 2022-12-30 ENCOUNTER — Inpatient Hospital Stay: Payer: BC Managed Care – PPO

## 2022-12-30 ENCOUNTER — Inpatient Hospital Stay: Payer: BC Managed Care – PPO | Attending: Nurse Practitioner | Admitting: Nurse Practitioner

## 2022-12-30 ENCOUNTER — Other Ambulatory Visit: Payer: Self-pay

## 2022-12-30 VITALS — BP 146/72 | HR 75 | Temp 97.7°F | Resp 20 | Wt 300.2 lb

## 2022-12-30 DIAGNOSIS — I10 Essential (primary) hypertension: Secondary | ICD-10-CM | POA: Insufficient documentation

## 2022-12-30 DIAGNOSIS — G4700 Insomnia, unspecified: Secondary | ICD-10-CM | POA: Diagnosis not present

## 2022-12-30 DIAGNOSIS — Z923 Personal history of irradiation: Secondary | ICD-10-CM | POA: Insufficient documentation

## 2022-12-30 DIAGNOSIS — Z800 Family history of malignant neoplasm of digestive organs: Secondary | ICD-10-CM | POA: Insufficient documentation

## 2022-12-30 DIAGNOSIS — E669 Obesity, unspecified: Secondary | ICD-10-CM | POA: Diagnosis not present

## 2022-12-30 DIAGNOSIS — Z170 Estrogen receptor positive status [ER+]: Secondary | ICD-10-CM

## 2022-12-30 DIAGNOSIS — Z9221 Personal history of antineoplastic chemotherapy: Secondary | ICD-10-CM | POA: Diagnosis not present

## 2022-12-30 DIAGNOSIS — D649 Anemia, unspecified: Secondary | ICD-10-CM | POA: Diagnosis not present

## 2022-12-30 DIAGNOSIS — C50411 Malignant neoplasm of upper-outer quadrant of right female breast: Secondary | ICD-10-CM | POA: Insufficient documentation

## 2022-12-30 DIAGNOSIS — Z79899 Other long term (current) drug therapy: Secondary | ICD-10-CM | POA: Insufficient documentation

## 2022-12-30 DIAGNOSIS — G629 Polyneuropathy, unspecified: Secondary | ICD-10-CM | POA: Insufficient documentation

## 2022-12-30 DIAGNOSIS — Z79811 Long term (current) use of aromatase inhibitors: Secondary | ICD-10-CM | POA: Insufficient documentation

## 2022-12-30 DIAGNOSIS — R232 Flushing: Secondary | ICD-10-CM | POA: Diagnosis not present

## 2022-12-30 LAB — CMP (CANCER CENTER ONLY)
ALT: 25 U/L (ref 0–44)
AST: 19 U/L (ref 15–41)
Albumin: 3.9 g/dL (ref 3.5–5.0)
Alkaline Phosphatase: 72 U/L (ref 38–126)
Anion gap: 10 (ref 5–15)
BUN: 9 mg/dL (ref 6–20)
CO2: 25 mmol/L (ref 22–32)
Calcium: 9 mg/dL (ref 8.9–10.3)
Chloride: 96 mmol/L — ABNORMAL LOW (ref 98–111)
Creatinine: 0.75 mg/dL (ref 0.44–1.00)
GFR, Estimated: >60 mL/min (ref >60)
Glucose, Bld: 91 mg/dL (ref 70–99)
Potassium: 3.9 mmol/L (ref 3.5–5.1)
Sodium: 131 mmol/L — ABNORMAL LOW (ref 135–145)
Total Bilirubin: 0.6 mg/dL (ref 0.3–1.2)
Total Protein: 8.3 g/dL — ABNORMAL HIGH (ref 6.5–8.1)

## 2022-12-30 LAB — CBC WITH DIFFERENTIAL (CANCER CENTER ONLY)
Abs Immature Granulocytes: 0.01 10*3/uL (ref 0.00–0.07)
Basophils Absolute: 0 10*3/uL (ref 0.0–0.1)
Basophils Relative: 1 %
Eosinophils Absolute: 0.4 10*3/uL (ref 0.0–0.5)
Eosinophils Relative: 8 %
HCT: 37.8 % (ref 36.0–46.0)
Hemoglobin: 12.4 g/dL (ref 12.0–15.0)
Immature Granulocytes: 0 %
Lymphocytes Relative: 29 %
Lymphs Abs: 1.7 10*3/uL (ref 0.7–4.0)
MCH: 25.7 pg — ABNORMAL LOW (ref 26.0–34.0)
MCHC: 32.8 g/dL (ref 30.0–36.0)
MCV: 78.4 fL — ABNORMAL LOW (ref 80.0–100.0)
Monocytes Absolute: 0.6 10*3/uL (ref 0.1–1.0)
Monocytes Relative: 11 %
Neutro Abs: 3 10*3/uL (ref 1.7–7.7)
Neutrophils Relative %: 51 %
Platelet Count: 395 10*3/uL (ref 150–400)
RBC: 4.82 MIL/uL (ref 3.87–5.11)
RDW: 15.6 % — ABNORMAL HIGH (ref 11.5–15.5)
Smear Review: NORMAL
WBC Count: 5.8 10*3/uL (ref 4.0–10.5)
nRBC: 0 % (ref 0.0–0.2)

## 2022-12-30 MED ORDER — LETROZOLE 2.5 MG PO TABS: 2.5000 mg | ORAL_TABLET | Freq: Every day | ORAL | 3 refills | Status: DC

## 2022-12-31 ENCOUNTER — Ambulatory Visit
Admission: RE | Admit: 2022-12-31 | Discharge: 2022-12-31 | Disposition: A | Discharge status: Home / Self Care | Payer: BC Managed Care – PPO | Source: Ambulatory Visit | Attending: Nurse Practitioner | Admitting: Nurse Practitioner

## 2022-12-31 ENCOUNTER — Encounter: Payer: Self-pay | Admitting: Hematology

## 2022-12-31 DIAGNOSIS — Z170 Estrogen receptor positive status [ER+]: Secondary | ICD-10-CM

## 2023-03-23 ENCOUNTER — Other Ambulatory Visit: Payer: Self-pay | Admitting: Hematology and Oncology

## 2023-05-09 ENCOUNTER — Other Ambulatory Visit: Payer: Self-pay | Admitting: Nurse Practitioner

## 2023-06-29 ENCOUNTER — Other Ambulatory Visit: Payer: Self-pay

## 2023-06-29 DIAGNOSIS — Z170 Estrogen receptor positive status [ER+]: Secondary | ICD-10-CM

## 2023-06-30 ENCOUNTER — Encounter: Payer: Self-pay | Admitting: Hematology

## 2023-06-30 ENCOUNTER — Inpatient Hospital Stay (HOSPITAL_BASED_OUTPATIENT_CLINIC_OR_DEPARTMENT_OTHER): Payer: BC Managed Care – PPO | Admitting: Hematology

## 2023-06-30 ENCOUNTER — Inpatient Hospital Stay: Payer: BC Managed Care – PPO | Attending: Hematology

## 2023-06-30 ENCOUNTER — Other Ambulatory Visit: Payer: Self-pay

## 2023-06-30 VITALS — BP 134/78 | HR 65 | Temp 98.7°F | Resp 18 | Ht 66.0 in | Wt 287.1 lb

## 2023-06-30 DIAGNOSIS — Z170 Estrogen receptor positive status [ER+]: Secondary | ICD-10-CM | POA: Insufficient documentation

## 2023-06-30 DIAGNOSIS — E2839 Other primary ovarian failure: Secondary | ICD-10-CM

## 2023-06-30 DIAGNOSIS — E669 Obesity, unspecified: Secondary | ICD-10-CM | POA: Diagnosis not present

## 2023-06-30 DIAGNOSIS — Z1211 Encounter for screening for malignant neoplasm of colon: Secondary | ICD-10-CM

## 2023-06-30 DIAGNOSIS — R232 Flushing: Secondary | ICD-10-CM | POA: Insufficient documentation

## 2023-06-30 DIAGNOSIS — Z800 Family history of malignant neoplasm of digestive organs: Secondary | ICD-10-CM | POA: Insufficient documentation

## 2023-06-30 DIAGNOSIS — C50411 Malignant neoplasm of upper-outer quadrant of right female breast: Secondary | ICD-10-CM | POA: Diagnosis present

## 2023-06-30 DIAGNOSIS — Z9221 Personal history of antineoplastic chemotherapy: Secondary | ICD-10-CM | POA: Diagnosis not present

## 2023-06-30 DIAGNOSIS — Z79811 Long term (current) use of aromatase inhibitors: Secondary | ICD-10-CM | POA: Diagnosis not present

## 2023-06-30 DIAGNOSIS — G629 Polyneuropathy, unspecified: Secondary | ICD-10-CM | POA: Diagnosis not present

## 2023-06-30 DIAGNOSIS — Z1379 Encounter for other screening for genetic and chromosomal anomalies: Secondary | ICD-10-CM | POA: Diagnosis not present

## 2023-06-30 LAB — CMP (CANCER CENTER ONLY)
ALT: 22 U/L (ref 0–44)
AST: 15 U/L (ref 15–41)
Albumin: 4.1 g/dL (ref 3.5–5.0)
Alkaline Phosphatase: 87 U/L (ref 38–126)
Anion gap: 5 (ref 5–15)
BUN: 7 mg/dL (ref 6–20)
CO2: 28 mmol/L (ref 22–32)
Calcium: 9.5 mg/dL (ref 8.9–10.3)
Chloride: 105 mmol/L (ref 98–111)
Creatinine: 0.8 mg/dL (ref 0.44–1.00)
GFR, Estimated: >60 mL/min (ref >60)
Glucose, Bld: 92 mg/dL (ref 70–99)
Potassium: 4.1 mmol/L (ref 3.5–5.1)
Sodium: 138 mmol/L (ref 135–145)
Total Bilirubin: 0.5 mg/dL (ref 0.3–1.2)
Total Protein: 8 g/dL (ref 6.5–8.1)

## 2023-06-30 LAB — CBC WITH DIFFERENTIAL (CANCER CENTER ONLY)
Abs Immature Granulocytes: 0.01 10*3/uL (ref 0.00–0.07)
Basophils Absolute: 0.1 10*3/uL (ref 0.0–0.1)
Basophils Relative: 1 %
Eosinophils Absolute: 0.4 10*3/uL (ref 0.0–0.5)
Eosinophils Relative: 8 %
HCT: 37.6 % (ref 36.0–46.0)
Hemoglobin: 12.3 g/dL (ref 12.0–15.0)
Immature Granulocytes: 0 %
Lymphocytes Relative: 33 %
Lymphs Abs: 1.7 10*3/uL (ref 0.7–4.0)
MCH: 25.7 pg — ABNORMAL LOW (ref 26.0–34.0)
MCHC: 32.7 g/dL (ref 30.0–36.0)
MCV: 78.7 fL — ABNORMAL LOW (ref 80.0–100.0)
Monocytes Absolute: 0.5 10*3/uL (ref 0.1–1.0)
Monocytes Relative: 9 %
Neutro Abs: 2.5 10*3/uL (ref 1.7–7.7)
Neutrophils Relative %: 49 %
Platelet Count: 383 10*3/uL (ref 150–400)
RBC: 4.78 MIL/uL (ref 3.87–5.11)
RDW: 15.4 % (ref 11.5–15.5)
WBC Count: 5.2 10*3/uL (ref 4.0–10.5)
nRBC: 0 % (ref 0.0–0.2)

## 2023-06-30 NOTE — Assessment & Plan Note (Signed)
-  negative except VUS of BRCA1 c.2048A>G

## 2023-06-30 NOTE — Progress Notes (Signed)
[facility]   [phone] [phone]   Clinic Follow up Note   Patient Care Team: Glenna Durand, MD as PCP - General (Family Medicine) Pershing Proud, RN as Oncology Nurse Navigator Donnelly Angelica, RN as Oncology Nurse Navigator Griselda Miner, MD as Consulting Physician (General Surgery) Malachy Mood, MD as Consulting Physician (Hematology) Lonie Peak, MD as Attending Physician (Radiation Oncology) Cherly Anderson, MD as Referring Physician Pollyann Samples, NP as Nurse Practitioner (Nurse Practitioner)  Date of Service:  06/30/2023  CHIEF COMPLAINT: f/u of  right breast cancer   CURRENT THERAPY:  Letrozole 2.5 mg once daily starting 12/2020     ASSESSMENT:  Paula Davidson is a 52 y.o. female with   Malignant neoplasm of upper-outer quadrant of right breast in female, estrogen receptor positive (HCC) pT2N1aM0, stage IB, ER+/PR+, HER2-, Grade II -Diagnosed in 08/2019. S/p right lumpectomy and SLNB with Dr. Carolynne Edouard on 09/24/19. 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  -To reduce her high risk she completed adjuvant chemo AC-T chemo on 04/04/20 and adjuvant Radiation in [facility] 04/21/20-06/18/20.  Port removed 09/2021. -She started antiestrogen therapy with Letrozole from 12/2020. She has tolerated moderately well with weight gain and hot flashes.   Genetic testing -negative except VUS of BRCA1 c.2048A>G     Obesity -She is trying to lose weight, and has been successful lately.  PLAN: -lab review, exam was unremarkable, no clinical concern for recurrence. - Continue Letrozole  -recommend Increasing Gabapentin to 200mg  once or bid during day and 300 at night for hot flushes  -lab and f/u in 6 months -Bone density scan and mammogram in February 2025  SUMMARY OF ONCOLOGIC HISTORY: Oncology History Overview Note  Cancer Staging Malignant neoplasm of upper-outer quadrant of right breast  in female, estrogen receptor positive (HCC) Staging form: Breast, AJCC 8th Edition - Clinical stage from 08/28/2019: Stage IB (cT2, cN0, cM0, G2, ER+, PR+, HER2-) - Signed by Malachy Mood, MD on 09/04/2019 - Pathologic stage from 09/24/2019: Stage IB (pT2, pN1a, cM0, G2, ER+, PR+, HER2-) - Signed by Malachy Mood, MD on 10/10/2019    Malignant neoplasm of upper-outer quadrant of right breast in female, estrogen receptor positive (HCC)  08/24/2019 Mammogram   Diagnostic mammogram and [place] 08/24/19 IMPRESSION: Suspicious right breast mass 10 o'clock 1 cm from the nipple measuring 3.3 x 1.9 x 1.9 cm and axillary adenopathy.   08/28/2019 Initial Biopsy   Diagnosis 08/28/19 1. Breast, right, needle core biopsy, 10 o'clock - INVASIVE MAMMARY CARCINOMA, GRADE II. - SEE MICROSCOPIC DESCRIPTION. 2. Lymph node, needle/core biopsy, right axilla - BENIGN LYMPH NODE. - NO METASTATIC CARCINOMA IDENTIFIED.    08/28/2019 Receptors her2   Results: IMMUNOHISTOCHEMICAL AND MORPHOMETRIC ANALYSIS PERFORMED MANUALLY The tumor cells are NEGATIVE for Her2 (1+). Estrogen Receptor: 100%, POSITIVE, STRONG STAINING INTENSITY Progesterone Receptor: 100%, POSITIVE, STRONG STAINING INTENSITY Proliferation Marker Ki67: 10%   08/28/2019 Cancer Staging   Staging form: Breast, AJCC 8th Edition - Clinical stage from 08/28/2019: Stage IB (cT2, cN0, cM0, G2, ER+, PR+, HER2-) - Signed by Malachy Mood, MD on 09/04/2019   08/31/2019 Initial Diagnosis   Malignant neoplasm of upper-outer quadrant of right breast in female, estrogen receptor positive (HCC)   09/07/2019 Breast MRI   Breast MRI 09/07/19  IMPRESSION: 1. 2.5 cm known malignancy in the anterior right breast, superficial depth near the nipple. 2. 7-8 mm indeterminate mass in the central upper  outer quadrant of the right breast. 3. One right axillary lymph node with apparent mild cortical thickening, which lies slightly anterior and inferior to the biopsied right  axillary lymph node ( benign reactive on pathology). This is most likely also benign given the pathology from the adjacent biopsied lymph node. 4. No evidence of left breast malignancy.   09/17/2019 Imaging   [place] right breast 09/17/19  IMPRESSION: Several benign appearing masses in the upper outer quadrant of the right breast without a definite correlate to the finding seen on prior MRI. The right axillary lymph nodes appear similar to the recently biopsied benign lymph node.   09/20/2019 Pathology Results   Diagnosis 09/20/19  Breast, right, needle core biopsy, upper outer quadrant - FIBROADENOMA - NO MALIGNANCY IDENTIFIED    09/21/2019 Genetic Testing   Negative genetic testing:  No pathogenic variants detected on the [facility] Breast Cancer STAT panel and Common Hereditary Cancers panel.  A variant of uncertain significance was identified in the BRCA1 gene, called c.2048A>G ([id number]).  The report date is 09/21/2019.  The STAT Breast cancer panel offered by [facility] includes sequencing and rearrangement analysis for the following 9 genes:  ATM, BRCA1, BRCA2, CDH1, CHEK2, PALB2, PTEN, STK11 and TP53.   The Common Hereditary Cancers Panel offered by [facility] includes sequencing and/or deletion duplication testing of the following 48 genes: APC, ATM, AXIN2, BARD1, BMPR1A, BRCA1, BRCA2, BRIP1, CDH1, CDK4, CDKN2A (p14ARF), CDKN2A (p16INK4a), CHEK2, CTNNA1, DICER1, EPCAM (Deletion/duplication testing only), GREM1 (promoter region deletion/duplication testing only), KIT, MEN1, MLH1, MSH2, MSH3, MSH6, MUTYH, NBN, NF1, NHTL1, PALB2, PDGFRA, PMS2, POLD1, POLE, PTEN, RAD50, RAD51C, RAD51D, RNF43, SDHB, SDHC, SDHD, SMAD4, SMARCA4. STK11, TP53, TSC1, TSC2, and VHL.  The following genes were evaluated for sequence changes only: SDHA and HOXB13 c.251G>A variant only.    09/24/2019 Surgery   RIGHT BREAST LUMPECTOMY WITH SENTINEL LYMPH NODE BIOPSY by Dr Carolynne Edouard  09/24/19    09/24/2019 Pathology Results   FINAL  MICROSCOPIC DIAGNOSIS: 09/24/19   A. LYMPH NODE, RIGHT #1, SENTINEL, BIOPSY:  - There is no evidence of carcinoma in 1 of 1 lymph node (0/1).   B. LYMPH NODE, RIGHT, SENTINEL, BIOPSY:  - There is no evidence of carcinoma in 1 of 1 lymph node (0/1).   C. 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   D. LYMPH NODE, RIGHT #2, SENTINEL, BIOPSY:  - There is no evidence of carcinoma in 1 of 1 lymph node (0/1).   E. LYMPH NODE, RIGHT #3 , SENTINEL, BIOPSY:  - There is no evidence of carcinoma in 1 of 1 lymph node (0/1).   F. LYMPH NODE, RIGHT, SENTINEL, BIOPSY:  - There is no evidence of carcinoma in 1 of 1 lymph node (0/1).   G. LYMPH NODE, RIGHT, SENTINEL, BIOPSY:  -There is no evidence of carcinoma in 1 of 1 lymph node (0/1).   H. 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  - Ductal carcinoma in situ, intermediate grade.  - Perineural invasion is identified.  - The surgical resection margins are negative for carcinoma.  - See oncology table below   I. BREAST, RIGHT ADDITIONAL INFERIOR MARGIN, EXCISION:  - Fibrocystic changes.  - There is no evidence of malignancy.     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 Postoperative findings in the right breast and axilla. Upper normal  sized right axillary lymph nodes, nonspecific. 2. 2 mm superior apical segment right upper lobe nodule is technically nonspecific although statistically likely to be benign. 3. 3.6 cm hypodense right adnexal structure, probably a cyst but technically nonspecific. If patient is premenopausal, no follow up is warranted; if early postmenopausal, follow up ultrasound in 6-12 months would be recommended. 4. Other imaging findings of potential clinical significance: Small type 1 hiatal hernia. Descending and sigmoid colon diverticulosis. Small indirect left inguinal hernia contains adipose tissue.   Aortic Atherosclerosis (ICD10-I70.0).   10/26/2019 Imaging   Whole Body Bone scan 10/26/19  IMPRESSION: Nonspecific sites of uptake at the RIGHT ischium and anterior LEFT sixth rib, cannot exclude metastatic foci.   Otherwise negative exam.     10/29/2019 - 04/04/2020 Chemotherapy   Adjuvant AC q2weeks for 4 cycles starting 10/29/19-12/21/19 followed by weekly Taxol for 12 weeks starting 01/11/20. Taxol reduced 30% starting with C6. Completed on 04/04/20   03/24/2020 Breast MRI   IMPRESSION: 1. Postsurgical changes in the right breast and right axilla. 2. No MRI evidence of malignancy in either breast.   RECOMMENDATION: Diagnostic bilateral mammogram in September 2021.   BI-RADS CATEGORY  2: Benign.   04/21/2020 - 06/18/2020 Radiation Therapy   adjuvant Radiation in [facility] 04/21/20-06/18/20 with Alvis Lemmings   12/2020 -  Anti-estrogen oral therapy   Letrozole 2.5mg  once daily starting 12/2020      INTERVAL HISTORY:  Paula Davidson is here for a follow up of  right breast cancer. She was last seen by NP Lacie on 12/30/2022. She presents to the clinic alone.Pt state that she is taking the Letrozole and she has hot flashes all the time. 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     All other  systems were reviewed with the patient and are negative.  MEDICAL HISTORY:  Past Medical History:  Diagnosis Date   Anxiety    Cancer (HCC) 09/24/2019   right breast cancer-had lumpectomy   Depression    DUB (dysfunctional uterine bleeding)    Family history of pancreatic cancer    Hypertension    Personal history of chemotherapy    Personal history of radiation therapy     SURGICAL HISTORY: Past Surgical History:  Procedure Laterality Date   BREAST LUMPECTOMY     BREAST LUMPECTOMY WITH AXILLARY LYMPH NODE BIOPSY Right 09/24/2019   Procedure: RIGHT BREAST LUMPECTOMY WITH SENTINEL LYMPH NODE BIOPSY;  Surgeon: Griselda Miner, MD;  Location: [facility];  Service: General;  Laterality: Right;   BREAST SURGERY  09/24/2019   Breast lump excised   FOOT SURGERY     IR REMOVAL TUN ACCESS W/ PORT W/O FL MOD SED  09/15/2021   LAPAROSCOPIC GASTRIC BANDING     PORTACATH PLACEMENT N/A 10/25/2019   Procedure:  INSERTION PORT-A-CATH WITH POSSIBLE ULTRASOUND;  Surgeon: Griselda Miner, MD;  Location: [facility];  Service: General;  Laterality: N/A;   WISDOM TOOTH EXTRACTION      I have reviewed the social history and family history with the patient and they are unchanged from previous note.  ALLERGIES:  is allergic to hydrochlorothiazide, codeine, and lisinopril.  MEDICATIONS:  Current Outpatient Medications  Medication Sig Dispense Refill   amLODipine (NORVASC) 10 MG tablet Take 10 mg by mouth daily.     amLODipine (NORVASC) 10 MG tablet Take 1 tablet by mouth daily.     Bioflavonoid Products (ESTER C PO) Take 1 tablet by mouth daily.     Calcium Carbonate-Vitamin D (CALCIUM + D PO) Take 1 tablet by mouth daily.      cetirizine (ZYRTEC) 10 MG tablet Take 10 mg by mouth daily as needed for allergies.     cholecalciferol (VITAMIN D) 25 MCG (1000 UT) tablet Take 1,000 Units by mouth daily.     citalopram (CELEXA) 20 MG tablet Take 20 mg by mouth daily.     gabapentin (NEURONTIN) 100  MG capsule TAKE 3 CAPSULES(300 MG) BY MOUTH AT BEDTIME 90 capsule 1   letrozole (FEMARA) 2.5 MG tablet Take 1 tablet (2.5 mg total) by mouth daily. 90 tablet 3   losartan (COZAAR) 100 MG tablet Take 100 mg by mouth daily.     Multiple Vitamin (MULTIVITAMIN WITH MINERALS) TABS tablet Take 1 tablet by mouth daily.     traZODone (DESYREL) 100 MG tablet TAKE 1 TABLET(100 MG) BY MOUTH AT BEDTIME 30 tablet 0   No current facility-administered medications for this visit.    PHYSICAL EXAMINATION: ECOG PERFORMANCE STATUS: 0 - Asymptomatic  Vitals:   06/30/23 1111  BP: 134/78  Pulse: 65  Resp: 18  Temp: 98.7 F (37.1 C)  SpO2: 100%   Wt Readings from Last 3 Encounters:  06/30/23 287 lb 1.6 oz (130.2 kg)  12/30/22 (!) 300 lb 3.2 oz (136.2 kg)  06/29/22 (!) 306 lb 8 oz (139 kg)     GENERAL:alert, no distress and comfortable SKIN: skin color normal, no rashes or significant lesions EYES: normal, Conjunctiva are pink and non-injected, sclera clear  NEURO: alert & oriented x 3 with fluent speech NECK: (-) supple, thyroid normal size, non-tender, without nodularity LYMPH: (-) no palpable lymphadenopathy in the cervical, axillary  BREAST: RT breast Lumpectomy, some scar tissue, no palpable mass, breast exam benign. LT breast no palpable mass breast exam benign. LABORATORY DATA:  I have reviewed the data as listed    Latest Ref Rng & Units 06/30/2023   10:54 AM 12/30/2022    8:46 AM 06/29/2022    9:49 AM  CBC  WBC 4.0 - 10.5 K/uL 5.2  5.8  6.1   Hemoglobin 12.0 - 15.0 g/dL 14.7  82.9  56.2   Hematocrit 36.0 - 46.0 % 37.6  37.8  35.2   Platelets 150 - 400 K/uL 383  395  429         Latest Ref Rng & Units 06/30/2023   10:54 AM 12/30/2022    8:46 AM 06/29/2022    9:49 AM  CMP  Glucose 70 - 99 mg/dL 92  91  130   BUN 6 - 20 mg/dL 7  9  9    Creatinine 0.44 - 1.00 mg/dL 8.65  7.84  6.96   Sodium 135 - 145 mmol/L 138  131  140   Potassium 3.5 - 5.1 mmol/L 4.1  3.9  4.0   Chloride 98 -  111 mmol/L 105  96  107   CO2 22 - 32 mmol/L 28  25  27    Calcium 8.9 - 10.3 mg/dL 9.5  9.0  9.7   Total Protein 6.5 - 8.1 g/dL 8.0  8.3  7.9   Total Bilirubin 0.3 - 1.2 mg/dL 0.5  0.6  0.3   Alkaline Phos 38 - 126 U/L 87  72  82   AST 15 - 41 U/L 15  19  15    ALT 0 - 44 U/L 22  25  20        RADIOGRAPHIC STUDIES: I have personally reviewed the radiological images as listed and agreed with the findings in the report. No results found.    Orders Placed This Encounter  Procedures   DG Bone Density    Standing Status:   Future    Standing Expiration Date:   06/29/2024    Order Specific Question:   Reason for Exam (SYMPTOM  OR DIAGNOSIS REQUIRED)    Answer:   SCREENING    Order Specific Question:   Is patient pregnant?    Answer:   No    Order Specific Question:   Preferred imaging location?    Answer:   [facility]   MM 3D SCREENING MAMMOGRAM BILATERAL BREAST    Standing Status:   Future    Standing Expiration Date:   06/29/2024    Order Specific Question:   Reason for Exam (SYMPTOM  OR DIAGNOSIS REQUIRED)    Answer:   screening    Order Specific Question:   Is the patient pregnant?    Answer:   No    Order Specific Question:   Preferred imaging location?    Answer:   [facility]   All questions were answered. The patient knows to call the clinic with any problems, questions or concerns. No barriers to learning was detected. The total time spent in the appointment was 25 minutes.     Malachy Mood, MD 06/30/2023   Carolin Coy, CMA, am acting as scribe for Malachy Mood, MD.   I have reviewed the above documentation for accuracy and completeness, and I agree with the above.

## 2023-06-30 NOTE — Assessment & Plan Note (Signed)
pT2N1aM0, stage IB, ER+/PR+, HER2-, Grade II -Diagnosed in 08/2019. S/p right lumpectomy and SLNB with Dr. Carolynne Edouard on 09/24/19. 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  -To reduce her high risk she completed adjuvant chemo AC-T chemo on 04/04/20 and adjuvant Radiation in [facility] 04/21/20-06/18/20.  Port removed 09/2021. -She started antiestrogen therapy with Letrozole from 12/2020. She has tolerated moderately well with weight gain and hot flashes.

## 2023-11-17 ENCOUNTER — Encounter: Payer: Self-pay | Admitting: Nurse Practitioner

## 2023-12-21 ENCOUNTER — Telehealth: Payer: Self-pay | Admitting: Hematology

## 2023-12-21 NOTE — Telephone Encounter (Signed)
 Rescheduled appointments per provider request. Left the patient a voicemail with the updated date and times. Patient will be mailed an appointment reminder.

## 2024-01-02 ENCOUNTER — Ambulatory Visit
Admission: RE | Admit: 2024-01-02 | Discharge: 2024-01-02 | Disposition: A | Discharge status: Home / Self Care | Payer: BC Managed Care – PPO | Source: Ambulatory Visit | Attending: Hematology

## 2024-01-02 DIAGNOSIS — Z1211 Encounter for screening for malignant neoplasm of colon: Secondary | ICD-10-CM

## 2024-01-04 ENCOUNTER — Inpatient Hospital Stay: Payer: BC Managed Care – PPO

## 2024-01-04 ENCOUNTER — Inpatient Hospital Stay: Payer: BC Managed Care – PPO | Admitting: Nurse Practitioner

## 2024-01-05 ENCOUNTER — Inpatient Hospital Stay: Payer: BC Managed Care – PPO | Admitting: Nurse Practitioner

## 2024-01-05 ENCOUNTER — Inpatient Hospital Stay: Payer: BC Managed Care – PPO

## 2024-01-05 NOTE — Progress Notes (Deleted)
 Patient Care Team: Glenna Durand, MD as PCP - General (Family Medicine) Pershing Proud, RN as Oncology Nurse Navigator Donnelly Angelica, RN as Oncology Nurse Navigator Griselda Miner, MD as Consulting Physician (General Surgery) Malachy Mood, MD as Consulting Physician (Hematology) Lonie Peak, MD as Attending Physician (Radiation Oncology) Cherly Anderson, MD as Referring Physician Pollyann Samples, NP as Nurse Practitioner (Nurse Practitioner)   CHIEF COMPLAINT:   Oncology History Overview Note  Cancer Staging Malignant neoplasm of upper-outer quadrant of right breast in female, estrogen receptor positive [facility]) Staging form: Breast, AJCC 8th Edition - Clinical stage from 08/28/2019: Stage IB (cT2, cN0, cM0, G2, ER+, PR+, HER2-) - Signed by Malachy Mood, MD on 09/04/2019 - Pathologic stage from 09/24/2019: Stage IB (pT2, pN1a, cM0, G2, ER+, PR+, HER2-) - Signed by Malachy Mood, MD on 10/10/2019    Malignant neoplasm of upper-outer quadrant of right breast in female, estrogen receptor positive (HCC)  08/24/2019 Mammogram   Diagnostic mammogram and [place] 08/24/19 IMPRESSION: Suspicious right breast mass 10 o'clock 1 cm from the nipple measuring 3.3 x 1.9 x 1.9 cm and axillary adenopathy.   08/28/2019 Initial Biopsy   Diagnosis 08/28/19 1. Breast, right, needle core biopsy, 10 o'clock - INVASIVE MAMMARY CARCINOMA, GRADE II. - SEE MICROSCOPIC DESCRIPTION. 2. Lymph node, needle/core biopsy, right axilla - BENIGN LYMPH NODE. - NO METASTATIC CARCINOMA IDENTIFIED.    08/28/2019 Receptors her2   Results: IMMUNOHISTOCHEMICAL AND MORPHOMETRIC ANALYSIS PERFORMED MANUALLY The tumor cells are NEGATIVE for Her2 (1+). Estrogen Receptor: 100%, POSITIVE, STRONG STAINING INTENSITY Progesterone Receptor: 100%, POSITIVE, STRONG STAINING INTENSITY Proliferation Marker Ki67: 10%   08/28/2019 Cancer Staging   Staging form: Breast, AJCC 8th Edition - Clinical stage from 08/28/2019: Stage IB  (cT2, cN0, cM0, G2, ER+, PR+, HER2-) - Signed by Malachy Mood, MD on 09/04/2019   08/31/2019 Initial Diagnosis   Malignant neoplasm of upper-outer quadrant of right breast in female, estrogen receptor positive (HCC)   09/07/2019 Breast MRI   Breast MRI 09/07/19  IMPRESSION: 1. 2.5 cm known malignancy in the anterior right breast, superficial depth near the nipple. 2. 7-8 mm indeterminate mass in the central upper outer quadrant of the right breast. 3. One right axillary lymph node with apparent mild cortical thickening, which lies slightly anterior and inferior to the biopsied right axillary lymph node ( benign reactive on pathology). This is most likely also benign given the pathology from the adjacent biopsied lymph node. 4. No evidence of left breast malignancy.   09/17/2019 Imaging   [place] right breast 09/17/19  IMPRESSION: Several benign appearing masses in the upper outer quadrant of the right breast without a definite correlate to the finding seen on prior MRI. The right axillary lymph nodes appear similar to the recently biopsied benign lymph node.   09/20/2019 Pathology Results   Diagnosis 09/20/19  Breast, right, needle core biopsy, upper outer quadrant - FIBROADENOMA - NO MALIGNANCY IDENTIFIED    09/21/2019 Genetic Testing   Negative genetic testing:  No pathogenic variants detected on the [facility] Breast Cancer STAT panel and Common Hereditary Cancers panel.  A variant of uncertain significance was identified in the BRCA1 gene, called c.2048A>G ([id number]).  The report date is 09/21/2019.  The STAT Breast cancer panel offered by [facility] includes sequencing and rearrangement analysis for the following 9 genes:  ATM, BRCA1, BRCA2, CDH1, CHEK2, PALB2, PTEN, STK11 and TP53.   The Common Hereditary Cancers Panel offered by [facility] includes sequencing and/or deletion duplication testing  of the following 48 genes: APC, ATM, AXIN2, BARD1, BMPR1A, BRCA1, BRCA2, BRIP1, CDH1, CDK4, CDKN2A  (p14ARF), CDKN2A (p16INK4a), CHEK2, CTNNA1, DICER1, EPCAM (Deletion/duplication testing only), GREM1 (promoter region deletion/duplication testing only), KIT, MEN1, MLH1, MSH2, MSH3, MSH6, MUTYH, NBN, NF1, NHTL1, PALB2, PDGFRA, PMS2, POLD1, POLE, PTEN, RAD50, RAD51C, RAD51D, RNF43, SDHB, SDHC, SDHD, SMAD4, SMARCA4. STK11, TP53, TSC1, TSC2, and VHL.  The following genes were evaluated for sequence changes only: SDHA and HOXB13 c.251G>A variant only.    09/24/2019 Surgery   RIGHT BREAST LUMPECTOMY WITH SENTINEL LYMPH NODE BIOPSY by Dr Carolynne Edouard  09/24/19    09/24/2019 Pathology Results   FINAL MICROSCOPIC DIAGNOSIS: 09/24/19   A. LYMPH NODE, RIGHT #1, SENTINEL, BIOPSY:  - There is no evidence of carcinoma in 1 of 1 lymph node (0/1).   B. LYMPH NODE, RIGHT, SENTINEL, BIOPSY:  - There is no evidence of carcinoma in 1 of 1 lymph node (0/1).   C. 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   D. LYMPH NODE, RIGHT #2, SENTINEL, BIOPSY:  - There is no evidence of carcinoma in 1 of 1 lymph node (0/1).   E. LYMPH NODE, RIGHT #3 , SENTINEL, BIOPSY:  - There is no evidence of carcinoma in 1 of 1 lymph node (0/1).   F. LYMPH NODE, RIGHT, SENTINEL, BIOPSY:  - There is no evidence of carcinoma in 1 of 1 lymph node (0/1).   G. LYMPH NODE, RIGHT, SENTINEL, BIOPSY:  -There is no evidence of carcinoma in 1 of 1 lymph node (0/1).   H. 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  - Ductal carcinoma in situ, intermediate grade.  - Perineural invasion is identified.  - The surgical resection margins are negative for carcinoma.  - See oncology table below   I. BREAST, RIGHT ADDITIONAL INFERIOR MARGIN, EXCISION:  - Fibrocystic changes.  - There is no evidence of malignancy.     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 Postoperative findings in the right breast and axilla. Upper normal sized right axillary lymph nodes, nonspecific. 2. 2 mm superior apical segment right upper lobe nodule is technically nonspecific although statistically likely to be benign. 3. 3.6 cm hypodense right adnexal structure, probably a cyst but technically nonspecific. If patient is premenopausal, no follow up is warranted; if early postmenopausal, follow up ultrasound in 6-12 months would be recommended. 4. Other imaging findings of potential clinical significance: Small type 1 hiatal hernia. Descending and sigmoid colon diverticulosis. Small indirect left inguinal hernia contains adipose tissue.   Aortic Atherosclerosis (ICD10-I70.0).   10/26/2019 Imaging   Whole Body Bone scan 10/26/19  IMPRESSION: Nonspecific sites of uptake at the RIGHT ischium and anterior LEFT sixth rib, cannot exclude metastatic foci.   Otherwise negative exam.  10/29/2019 - 04/04/2020 Chemotherapy   Adjuvant AC q2weeks for 4 cycles starting 10/29/19-12/21/19 followed by weekly Taxol for 12 weeks starting 01/11/20. Taxol reduced 30% starting with C6. Completed on 04/04/20   03/24/2020 Breast MRI   IMPRESSION: 1. Postsurgical changes in the right breast and right axilla. 2. No MRI evidence of malignancy in either breast.   RECOMMENDATION: Diagnostic bilateral mammogram in September 2021.   BI-RADS CATEGORY  2: Benign.   04/21/2020  - 06/18/2020 Radiation Therapy   adjuvant Radiation in [facility] 04/21/20-06/18/20 with Alvis Lemmings   12/2020 -  Anti-estrogen oral therapy   Letrozole 2.5mg  once daily starting 12/2020      CURRENT THERAPY:   INTERVAL HISTORY   ROS   Past Medical History:  Diagnosis Date   Anxiety    Cancer (HCC) 09/24/2019   right breast cancer-had lumpectomy   Depression    DUB (dysfunctional uterine bleeding)    Family history of pancreatic cancer    Hypertension    Personal history of chemotherapy    Personal history of radiation therapy      Past Surgical History:  Procedure Laterality Date   BREAST LUMPECTOMY     BREAST LUMPECTOMY WITH AXILLARY LYMPH NODE BIOPSY Right 09/24/2019   Procedure: RIGHT BREAST LUMPECTOMY WITH SENTINEL LYMPH NODE BIOPSY;  Surgeon: Griselda Miner, MD;  Location: [facility];  Service: General;  Laterality: Right;   BREAST SURGERY  09/24/2019   Breast lump excised   FOOT SURGERY     IR REMOVAL TUN ACCESS W/ PORT W/O FL MOD SED  09/15/2021   LAPAROSCOPIC GASTRIC BANDING     PORTACATH PLACEMENT N/A 10/25/2019   Procedure: INSERTION PORT-A-CATH WITH POSSIBLE ULTRASOUND;  Surgeon: Chevis Pretty III, MD;  Location: [facility];  Service: General;  Laterality: N/A;   WISDOM TOOTH EXTRACTION       Outpatient Encounter Medications as of 01/05/2024  Medication Sig Note   amLODipine (NORVASC) 10 MG tablet Take 10 mg by mouth daily.    amLODipine (NORVASC) 10 MG tablet Take 1 tablet by mouth daily.    Bioflavonoid Products (ESTER C PO) Take 1 tablet by mouth daily.    Calcium Carbonate-Vitamin D (CALCIUM + D PO) Take 1 tablet by mouth daily.  09/15/2021: No longer taking   cetirizine (ZYRTEC) 10 MG tablet Take 10 mg by mouth daily as needed for allergies.    cholecalciferol (VITAMIN D) 25 MCG (1000 UT) tablet Take 1,000 Units by mouth daily. 09/15/2021: No longer taking   citalopram (CELEXA) 20 MG tablet Take 20 mg by mouth daily.    gabapentin (NEURONTIN) 100 MG  capsule TAKE 3 CAPSULES(300 MG) BY MOUTH AT BEDTIME    letrozole (FEMARA) 2.5 MG tablet Take 1 tablet (2.5 mg total) by mouth daily.    losartan (COZAAR) 100 MG tablet Take 100 mg by mouth daily.    Multiple Vitamin (MULTIVITAMIN WITH MINERALS) TABS tablet Take 1 tablet by mouth daily.    traZODone (DESYREL) 100 MG tablet TAKE 1 TABLET(100 MG) BY MOUTH AT BEDTIME    [DISCONTINUED] prochlorperazine (COMPAZINE) 10 MG tablet Take 1 tablet (10 mg total) by mouth every 6 (six) hours as needed (Nausea or vomiting).    No facility-administered encounter medications on file as of 01/05/2024.     There were no vitals filed for this visit. There is no height or weight on file to calculate BMI.   PHYSICAL EXAM GENERAL:alert, no distress and comfortable SKIN: no rash  EYES: sclera clear  NECK: without mass LYMPH:  no palpable cervical or supraclavicular lymphadenopathy  LUNGS: clear with normal breathing effort HEART: regular rate & rhythm, no lower extremity edema ABDOMEN: abdomen soft, non-tender and normal bowel sounds NEURO: alert & oriented x 3 with fluent speech, no focal motor/sensory deficits Breast exam:  PAC without erythema    CBC    Component Value Date/Time   WBC 5.2 06/30/2023 1054   WBC 6.1 06/29/2022 0949   RBC 4.78 06/30/2023 1054   HGB 12.3 06/30/2023 1054   HCT 37.6 06/30/2023 1054   PLT 383 06/30/2023 1054   MCV 78.7 (L) 06/30/2023 1054   MCH 25.7 (L) 06/30/2023 1054   MCHC 32.7 06/30/2023 1054   RDW 15.4 06/30/2023 1054   LYMPHSABS 1.7 06/30/2023 1054   MONOABS 0.5 06/30/2023 1054   EOSABS 0.4 06/30/2023 1054   BASOSABS 0.1 06/30/2023 1054     CMP     Component Value Date/Time   NA 138 06/30/2023 1054   K 4.1 06/30/2023 1054   CL 105 06/30/2023 1054   CO2 28 06/30/2023 1054   GLUCOSE 92 06/30/2023 1054   BUN 7 06/30/2023 1054   CREATININE 0.80 06/30/2023 1054   CALCIUM 9.5 06/30/2023 1054   PROT 8.0 06/30/2023 1054   ALBUMIN 4.1 06/30/2023 1054   AST  15 06/30/2023 1054   ALT 22 06/30/2023 1054   ALKPHOS 87 06/30/2023 1054   BILITOT 0.5 06/30/2023 1054   GFRNONAA >60 06/30/2023 1054   GFRAA >60 06/27/2020 0947     ASSESSMENT & PLAN:  PLAN:  No orders of the defined types were placed in this encounter.     All questions were answered. The patient knows to call the clinic with any problems, questions or concerns. No barriers to learning were detected. I spent *** counseling the patient face to face. The total time spent in the appointment was *** and more than 50% was on counseling, review of test results, and coordination of care.   Santiago Glad, NP-C @DATE @

## 2024-01-23 ENCOUNTER — Other Ambulatory Visit: Payer: Self-pay | Admitting: Nurse Practitioner

## 2024-01-23 DIAGNOSIS — C50411 Malignant neoplasm of upper-outer quadrant of right female breast: Secondary | ICD-10-CM

## 2024-01-23 NOTE — Assessment & Plan Note (Signed)
 pT2N1aM0, stage IB, ER+/PR+, HER2-, Grade II -Diagnosed in 08/2019. S/p right lumpectomy and SLNB with Dr. Carolynne Edouard on 09/24/19. 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  -To reduce her high risk she completed adjuvant chemo AC-T chemo on 04/04/20 and adjuvant Radiation in [facility] 04/21/20-06/18/20.  Port removed 09/2021. -She started antiestrogen therapy with Letrozole from 12/2020. She has tolerated moderately well with weight gain and hot flashes.  -currently on gabapentin to help hot flashes.   Genetic testing -negative except VUS of BRCA1 c.2048A>G

## 2024-01-23 NOTE — Progress Notes (Unsigned)
 Patient Care Team: Glenna Durand, MD as PCP - General (Family Medicine) Pershing Proud, RN as Oncology Nurse Navigator Donnelly Angelica, RN as Oncology Nurse Navigator Griselda Miner, MD as Consulting Physician (General Surgery) Malachy Mood, MD as Consulting Physician (Hematology) Lonie Peak, MD as Attending Physician (Radiation Oncology) Cherly Anderson, MD as Referring Physician Pollyann Samples, NP as Nurse Practitioner (Nurse Practitioner)  Clinic Day:  01/27/2024  Referring physician: Glenna Durand, MD  ASSESSMENT & PLAN:   Assessment & Plan: Malignant neoplasm of upper-outer quadrant of right breast in female, estrogen receptor positive (HCC) pT2N1aM0, stage IB, ER+/PR+, HER2-, Grade II -Diagnosed in 08/2019. S/p right lumpectomy and SLNB with Dr. Carolynne Edouard on 09/24/19. 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  -To reduce her high risk she completed adjuvant chemo AC-T chemo on 04/04/20 and adjuvant Radiation in [facility] 04/21/20-06/18/20.  Port removed 09/2021. -She started antiestrogen therapy with Letrozole from 12/2020. She has tolerated moderately well with weight gain and hot flashes.  -Letrozole in January 2025 due to cramping in hands and legs. -Exemestane started in March 2025. -currently on gabapentin to help hot flashes.   Genetic testing -negative except VUS of BRCA1 c.2048A>G     BMI >40 Improving.  Patient has had 36 pound weight loss since her most recent visit.  She is taking Zepbound 7.5 mg and is doing intermittent fasting.  She has cut sodas out of her diet and increase her water intake.  She does food prep every day.  She is recently joined [employer].  Congratulated her progress and encouraged her to continue.  Adverse effect of medication Patient experiencing cramping and pain in her hands and legs.  She stopped letrozole.  She is open to trial of exemestane.  New prescription sent to the  pharmacy.  Hot flashes These have continued despite being off estrogen blocker.  Gabapentin is helpful.  Advise she continue taking 200 mg a day 300 mg at night.  Refill sent to her pharmacy.  Plan: Reviewed labs.  Results are unremarkable. Trial exemestane. Labs and follow-up in 3 months.  She understands she can return sooner if needed.   The patient understands the plans discussed today and is in agreement with them.  She knows to contact our office if she develops concerns prior to her next appointment.  I provided 25 minutes of face-to-face time during this encounter and > 50% was spent counseling as documented under my assessment and plan.    Carlean Jews, NP  [facility] - A DEPT OF [facility] [address] [place] [address] Dept: [phone] Dept Fax: [phone]   No orders of the defined types were placed in this encounter.     CHIEF COMPLAINT:  CC:  f/u right breast cancer, estrogen receptor positive.   Current Treatment:  letrozole 2.5 mg daily starting 12/2020  INTERVAL HISTORY:  Paula Davidson is here today for repeat clinical assessment. She was last seen by Dr. Mosetta Putt 06/30/2023, and presents today for 6 month  follow up. Gabapentin dosing was increased to 200 mg during the day and 300 mg at night to help with hot flashes.  States she stopped taking letrozole due to joint and muscle pain.  She is open to trial of exemestane.  She has continued to be successful with weight loss.  Has had additional 36 pound weight loss  since she was last seen.  Plans to continue.  Recently joined the gym.  She is walking twice daily with her dog.  Tries to walk 2 miles each day.  Bilateral 3D diagnostic mammogram done 01/02/2024. Results were benign. She is scheduled for DEXA scan on 02/07/2024. She denies chest pain, chest pressure, or shortness of breath. She denies headaches or visual disturbances. She denies abdominal  pain, nausea, vomiting, or changes in bowel or bladder habits.   She denies fevers or chills. She denies pain.   I have reviewed the past medical history, past surgical history, social history and family history with the patient and they are unchanged from previous note.  ALLERGIES:  is allergic to hydrochlorothiazide, codeine, and lisinopril.  MEDICATIONS:  Current Outpatient Medications  Medication Sig Dispense Refill   amLODipine (NORVASC) 10 MG tablet Take 1 tablet by mouth daily.     Bioflavonoid Products (ESTER C PO) Take 1 tablet by mouth daily.     Calcium Carbonate-Vitamin D (CALCIUM + D PO) Take 1 tablet by mouth daily.      cetirizine (ZYRTEC) 10 MG tablet Take 10 mg by mouth daily as needed for allergies.     cholecalciferol (VITAMIN D) 25 MCG (1000 UT) tablet Take 1,000 Units by mouth daily.     citalopram (CELEXA) 20 MG tablet Take 20 mg by mouth daily.     exemestane (AROMASIN) 25 MG tablet Take 1 tablet (25 mg total) by mouth daily after breakfast. 90 tablet 0   losartan (COZAAR) 100 MG tablet Take 100 mg by mouth daily.     Multiple Vitamin (MULTIVITAMIN WITH MINERALS) TABS tablet Take 1 tablet by mouth daily.     traZODone (DESYREL) 100 MG tablet TAKE 1 TABLET(100 MG) BY MOUTH AT BEDTIME 30 tablet 0   amLODipine (NORVASC) 10 MG tablet Take 10 mg by mouth daily.     gabapentin (NEURONTIN) 100 MG capsule TAKE 3 CAPSULES(300 MG) BY MOUTH AT BEDTIME 90 capsule 1   No current facility-administered medications for this visit.    HISTORY OF PRESENT ILLNESS:   Oncology History Overview Note  Cancer Staging Malignant neoplasm of upper-outer quadrant of right breast in female, estrogen receptor positive (HCC) Staging form: Breast, AJCC 8th Edition - Clinical stage from 08/28/2019: Stage IB (cT2, cN0, cM0, G2, ER+, PR+, HER2-) - Signed by Malachy Mood, MD on 09/04/2019 - Pathologic stage from 09/24/2019: Stage IB (pT2, pN1a, cM0, G2, ER+, PR+, HER2-) - Signed by Malachy Mood, MD on  10/10/2019    Malignant neoplasm of upper-outer quadrant of right breast in female, estrogen receptor positive (HCC)  08/24/2019 Mammogram   Diagnostic mammogram and [place] 08/24/19 IMPRESSION: Suspicious right breast mass 10 o'clock 1 cm from the nipple measuring 3.3 x 1.9 x 1.9 cm and axillary adenopathy.   08/28/2019 Initial Biopsy   Diagnosis 08/28/19 1. Breast, right, needle core biopsy, 10 o'clock - INVASIVE MAMMARY CARCINOMA, GRADE II. - SEE MICROSCOPIC DESCRIPTION. 2. Lymph node, needle/core biopsy, right axilla - BENIGN LYMPH NODE. - NO METASTATIC CARCINOMA IDENTIFIED.    08/28/2019 Receptors her2   Results: IMMUNOHISTOCHEMICAL AND MORPHOMETRIC ANALYSIS PERFORMED MANUALLY The tumor cells are NEGATIVE for Her2 (1+). Estrogen Receptor: 100%, POSITIVE, STRONG STAINING INTENSITY Progesterone Receptor: 100%, POSITIVE, STRONG STAINING INTENSITY Proliferation Marker Ki67: 10%   08/28/2019 Cancer Staging   Staging form: Breast, AJCC 8th Edition - Clinical stage from 08/28/2019: Stage IB (cT2, cN0, cM0, G2, ER+, PR+, HER2-) - Signed by Malachy Mood, MD on 09/04/2019  08/31/2019 Initial Diagnosis   Malignant neoplasm of upper-outer quadrant of right breast in female, estrogen receptor positive (HCC)   09/07/2019 Breast MRI   Breast MRI 09/07/19  IMPRESSION: 1. 2.5 cm known malignancy in the anterior right breast, superficial depth near the nipple. 2. 7-8 mm indeterminate mass in the central upper outer quadrant of the right breast. 3. One right axillary lymph node with apparent mild cortical thickening, which lies slightly anterior and inferior to the biopsied right axillary lymph node ( benign reactive on pathology). This is most likely also benign given the pathology from the adjacent biopsied lymph node. 4. No evidence of left breast malignancy.   09/17/2019 Imaging   [place] right breast 09/17/19  IMPRESSION: Several benign appearing masses in the upper outer quadrant of  the right breast without a definite correlate to the finding seen on prior MRI. The right axillary lymph nodes appear similar to the recently biopsied benign lymph node.   09/20/2019 Pathology Results   Diagnosis 09/20/19  Breast, right, needle core biopsy, upper outer quadrant - FIBROADENOMA - NO MALIGNANCY IDENTIFIED    09/21/2019 Genetic Testing   Negative genetic testing:  No pathogenic variants detected on the [facility] Breast Cancer STAT panel and Common Hereditary Cancers panel.  A variant of uncertain significance was identified in the BRCA1 gene, called c.2048A>G ([id number]).  The report date is 09/21/2019.  The STAT Breast cancer panel offered by [facility] includes sequencing and rearrangement analysis for the following 9 genes:  ATM, BRCA1, BRCA2, CDH1, CHEK2, PALB2, PTEN, STK11 and TP53.   The Common Hereditary Cancers Panel offered by [facility] includes sequencing and/or deletion duplication testing of the following 48 genes: APC, ATM, AXIN2, BARD1, BMPR1A, BRCA1, BRCA2, BRIP1, CDH1, CDK4, CDKN2A (p14ARF), CDKN2A (p16INK4a), CHEK2, CTNNA1, DICER1, EPCAM (Deletion/duplication testing only), GREM1 (promoter region deletion/duplication testing only), KIT, MEN1, MLH1, MSH2, MSH3, MSH6, MUTYH, NBN, NF1, NHTL1, PALB2, PDGFRA, PMS2, POLD1, POLE, PTEN, RAD50, RAD51C, RAD51D, RNF43, SDHB, SDHC, SDHD, SMAD4, SMARCA4. STK11, TP53, TSC1, TSC2, and VHL.  The following genes were evaluated for sequence changes only: SDHA and HOXB13 c.251G>A variant only.    09/24/2019 Surgery   RIGHT BREAST LUMPECTOMY WITH SENTINEL LYMPH NODE BIOPSY by Dr Carolynne Edouard  09/24/19    09/24/2019 Pathology Results   FINAL MICROSCOPIC DIAGNOSIS: 09/24/19   A. LYMPH NODE, RIGHT #1, SENTINEL, BIOPSY:  - There is no evidence of carcinoma in 1 of 1 lymph node (0/1).   B. LYMPH NODE, RIGHT, SENTINEL, BIOPSY:  - There is no evidence of carcinoma in 1 of 1 lymph node (0/1).   C. LYMPH NODE, RIGHT, SENTINEL, BIOPSY:  - Metastatic  carcinoma in 1 of 1 lymph node (1/1).   D. LYMPH NODE, RIGHT #2, SENTINEL, BIOPSY:  - There is no evidence of carcinoma in 1 of 1 lymph node (0/1).   E. LYMPH NODE, RIGHT #3 , SENTINEL, BIOPSY:  - There is no evidence of carcinoma in 1 of 1 lymph node (0/1).   F. LYMPH NODE, RIGHT, SENTINEL, BIOPSY:  - There is no evidence of carcinoma in 1 of 1 lymph node (0/1).   G. LYMPH NODE, RIGHT, SENTINEL, BIOPSY:  -There is no evidence of carcinoma in 1 of 1 lymph node (0/1).   H. 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  - Ductal carcinoma in situ, intermediate grade.  - Perineural invasion is identified.  - The surgical resection margins are negative for carcinoma.  - See oncology table below  I. BREAST, RIGHT ADDITIONAL INFERIOR MARGIN, EXCISION:  - Fibrocystic changes.  - There is no evidence of malignancy.     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 Postoperative findings in the right breast and axilla. Upper normal sized right axillary lymph nodes, nonspecific. 2. 2 mm superior apical segment right upper lobe nodule is technically nonspecific although statistically likely to be benign. 3. 3.6 cm hypodense right adnexal structure, probably a cyst but technically nonspecific. If patient is premenopausal, no follow up is  warranted; if early postmenopausal, follow up ultrasound in 6-12 months would be recommended. 4. Other imaging findings of potential clinical significance: Small type 1 hiatal hernia. Descending and sigmoid colon diverticulosis. Small indirect left inguinal hernia contains adipose tissue.   Aortic Atherosclerosis (ICD10-I70.0).   10/26/2019 Imaging   Whole Body Bone scan 10/26/19  IMPRESSION: Nonspecific sites of uptake at the RIGHT ischium and anterior LEFT sixth rib, cannot exclude metastatic foci.   Otherwise negative exam.     10/29/2019 - 04/04/2020 Chemotherapy   Adjuvant AC q2weeks for 4 cycles starting 10/29/19-12/21/19 followed by weekly Taxol for 12 weeks starting 01/11/20. Taxol reduced 30% starting with C6. Completed on 04/04/20   03/24/2020 Breast MRI   IMPRESSION: 1. Postsurgical changes in the right breast and right axilla. 2. No MRI evidence of malignancy in either breast.   RECOMMENDATION: Diagnostic bilateral mammogram in September 2021.   BI-RADS CATEGORY  2: Benign.   04/21/2020 - 06/18/2020 Radiation Therapy   adjuvant Radiation in [facility] 04/21/20-06/18/20 with Alvis Lemmings   12/2020 -  Anti-estrogen oral therapy   Letrozole 2.5mg  once daily starting 12/2020       REVIEW OF SYSTEMS:   Constitutional: Denies fevers, chills or abnormal weight loss Eyes: Denies blurriness of vision Ears, nose, mouth, throat, and face: Denies mucositis or sore throat Respiratory: Denies cough, dyspnea or wheezes Cardiovascular: Denies palpitation, chest discomfort or lower extremity swelling Gastrointestinal:  Denies nausea, heartburn or change in bowel habits Skin: Denies abnormal skin rashes Lymphatics: Denies new lymphadenopathy or easy bruising Neurological:Denies numbness, tingling or new weaknesses Behavioral/Psych: Mood is stable, no new changes   All other systems were reviewed with the patient and are negative.   VITALS:   Today's Vitals   01/24/24 0908  BP:  130/76  Pulse: 77  Resp: 17  Temp: (!) 97.2 F (36.2 C)  TempSrc: Temporal  SpO2: 99%  Weight: 251 lb 8 oz (114.1 kg)  PainSc: 0-No pain   Body mass index is 40.59 kg/m.   Wt Readings from Last 3 Encounters:  01/24/24 251 lb 8 oz (114.1 kg)  06/30/23 287 lb 1.6 oz (130.2 kg)  12/30/22 (!) 300 lb 3.2 oz (136.2 kg)    Body mass index is 40.59 kg/m.  Performance status (ECOG): 1 - Symptomatic but completely ambulatory  PHYSICAL EXAM:  GENERAL:alert, no distress and comfortable SKIN: skin color, texture, turgor are normal, no rashes or significant lesions EYES: normal, Conjunctiva are pink and non-injected, sclera clear OROPHARYNX:no exudate, no erythema and lips, buccal mucosa, and tongue normal  NECK: supple, thyroid normal size, non-tender, without nodularity LYMPH:  no palpable lymphadenopathy in the cervical, axillary or inguinal LUNGS: clear to auscultation and percussion with normal breathing effort HEART: regular rate & rhythm and no murmurs and no lower extremity edema ABDOMEN:abdomen soft, non-tender and normal bowel sounds Musculoskeletal:no cyanosis of digits and no clubbing  NEURO: alert & oriented x 3 with fluent speech, no focal motor/sensory deficits BREAST: Well-healed lumpectomy scars on the right breast.  No palpable lumps or masses appreciated today.  No nipple inversion or discharge from the nipple.  No axillary lymphadenopathy on the right.  There are no palpable masses or lumps in the left breast.  There is no nipple inversion or drainage.  There is no axillary lymphadenopathy on the left.  LABORATORY DATA:  I have reviewed the data as listed    Component Value Date/Time   NA 138 01/24/2024 0839   K 4.1 01/24/2024 0839   CL 105 01/24/2024 0839   CO2 28 01/24/2024 0839   GLUCOSE 90 01/24/2024 0839   BUN 8 01/24/2024 0839   CREATININE 0.77 01/24/2024 0839   CALCIUM 9.6 01/24/2024 0839   PROT 8.2 (H) 01/24/2024 0839   ALBUMIN 4.2 01/24/2024 0839    AST 15 01/24/2024 0839   ALT 16 01/24/2024 0839   ALKPHOS 78 01/24/2024 0839   BILITOT 0.4 01/24/2024 0839   GFRNONAA >60 01/24/2024 0839   GFRAA >60 06/27/2020 0947    Lab Results  Component Value Date   WBC 7.2 01/24/2024   NEUTROABS 4.5 01/24/2024   HGB 12.2 01/24/2024   HCT 37.5 01/24/2024   MCV 80.3 01/24/2024   PLT 411 (H) 01/24/2024     RADIOGRAPHIC STUDIES:MM 3D DIAGNOSTIC MAMMOGRAM BILATERAL BREAST Result Date: 01/02/2024 CLINICAL DATA:  History of right breast cancer status post lumpectomy in 2020. EXAM: DIGITAL DIAGNOSTIC BILATERAL MAMMOGRAM WITH TOMOSYNTHESIS AND CAD TECHNIQUE: Bilateral digital diagnostic mammography and breast tomosynthesis was performed. The images were evaluated with computer-aided detection. COMPARISON:  Previous exam(s). ACR Breast Density Category c: The breasts are heterogeneously dense, which may obscure small masses. FINDINGS: Stable lumpectomy changes are seen in the right breast. No suspicious mass or malignant type microcalcifications identified in either breast. IMPRESSION: No evidence of malignancy in either breast. RECOMMENDATION: Per protocol, as the patient is now 2 or more years status post lumpectomy, she may return to annual screening mammography in 1 year. However, given the history of breast cancer, the patient remains eligible for annual diagnostic mammography if preferred. I have discussed the findings and recommendations with the patient. If applicable, a reminder letter will be sent to the patient regarding the next appointment. BI-RADS CATEGORY  2: Benign. Electronically Signed   By: Baird Lyons M.D.   On: 01/02/2024 09:06

## 2024-01-24 ENCOUNTER — Inpatient Hospital Stay (HOSPITAL_BASED_OUTPATIENT_CLINIC_OR_DEPARTMENT_OTHER): Payer: BC Managed Care – PPO | Admitting: Nurse Practitioner

## 2024-01-24 ENCOUNTER — Inpatient Hospital Stay: Payer: BC Managed Care – PPO | Attending: Nurse Practitioner

## 2024-01-24 VITALS — BP 130/76 | HR 77 | Temp 97.2°F | Resp 17 | Wt 251.5 lb

## 2024-01-24 DIAGNOSIS — Z79899 Other long term (current) drug therapy: Secondary | ICD-10-CM | POA: Diagnosis not present

## 2024-01-24 DIAGNOSIS — I700 Atherosclerosis of aorta: Secondary | ICD-10-CM | POA: Diagnosis not present

## 2024-01-24 DIAGNOSIS — Z170 Estrogen receptor positive status [ER+]: Secondary | ICD-10-CM

## 2024-01-24 DIAGNOSIS — R634 Abnormal weight loss: Secondary | ICD-10-CM | POA: Diagnosis not present

## 2024-01-24 DIAGNOSIS — Z79811 Long term (current) use of aromatase inhibitors: Secondary | ICD-10-CM | POA: Insufficient documentation

## 2024-01-24 DIAGNOSIS — R232 Flushing: Secondary | ICD-10-CM | POA: Insufficient documentation

## 2024-01-24 DIAGNOSIS — C9110 Chronic lymphocytic leukemia of B-cell type not having achieved remission: Secondary | ICD-10-CM | POA: Diagnosis not present

## 2024-01-24 DIAGNOSIS — K449 Diaphragmatic hernia without obstruction or gangrene: Secondary | ICD-10-CM | POA: Diagnosis not present

## 2024-01-24 DIAGNOSIS — C50411 Malignant neoplasm of upper-outer quadrant of right female breast: Secondary | ICD-10-CM | POA: Diagnosis present

## 2024-01-24 LAB — CMP (CANCER CENTER ONLY)
ALT: 16 U/L (ref 0–44)
AST: 15 U/L (ref 15–41)
Albumin: 4.2 g/dL (ref 3.5–5.0)
Alkaline Phosphatase: 78 U/L (ref 38–126)
Anion gap: 5 (ref 5–15)
BUN: 8 mg/dL (ref 6–20)
CO2: 28 mmol/L (ref 22–32)
Calcium: 9.6 mg/dL (ref 8.9–10.3)
Chloride: 105 mmol/L (ref 98–111)
Creatinine: 0.77 mg/dL (ref 0.44–1.00)
GFR, Estimated: >60 mL/min (ref >60)
Glucose, Bld: 90 mg/dL (ref 70–99)
Potassium: 4.1 mmol/L (ref 3.5–5.1)
Sodium: 138 mmol/L (ref 135–145)
Total Bilirubin: 0.4 mg/dL (ref 0.0–1.2)
Total Protein: 8.2 g/dL — ABNORMAL HIGH (ref 6.5–8.1)

## 2024-01-24 LAB — CBC WITH DIFFERENTIAL (CANCER CENTER ONLY)
Abs Immature Granulocytes: 0.01 10*3/uL (ref 0.00–0.07)
Basophils Absolute: 0.1 10*3/uL (ref 0.0–0.1)
Basophils Relative: 1 %
Eosinophils Absolute: 0.5 10*3/uL (ref 0.0–0.5)
Eosinophils Relative: 7 %
HCT: 37.5 % (ref 36.0–46.0)
Hemoglobin: 12.2 g/dL (ref 12.0–15.0)
Immature Granulocytes: 0 %
Lymphocytes Relative: 22 %
Lymphs Abs: 1.6 10*3/uL (ref 0.7–4.0)
MCH: 26.1 pg (ref 26.0–34.0)
MCHC: 32.5 g/dL (ref 30.0–36.0)
MCV: 80.3 fL (ref 80.0–100.0)
Monocytes Absolute: 0.5 10*3/uL (ref 0.1–1.0)
Monocytes Relative: 8 %
Neutro Abs: 4.5 10*3/uL (ref 1.7–7.7)
Neutrophils Relative %: 62 %
Platelet Count: 411 10*3/uL — ABNORMAL HIGH (ref 150–400)
RBC: 4.67 MIL/uL (ref 3.87–5.11)
RDW: 14.6 % (ref 11.5–15.5)
WBC Count: 7.2 10*3/uL (ref 4.0–10.5)
nRBC: 0 % (ref 0.0–0.2)

## 2024-01-24 MED ORDER — GABAPENTIN 100 MG PO CAPS: ORAL_CAPSULE | ORAL | 1 refills | Status: DC

## 2024-01-24 MED ORDER — EXEMESTANE 25 MG PO TABS: 25.0000 mg | ORAL_TABLET | Freq: Every day | ORAL | 0 refills | Status: DC

## 2024-01-26 ENCOUNTER — Encounter: Payer: Self-pay | Admitting: Nurse Practitioner

## 2024-01-27 ENCOUNTER — Encounter: Payer: Self-pay | Admitting: Nurse Practitioner

## 2024-02-07 ENCOUNTER — Other Ambulatory Visit: Payer: BC Managed Care – PPO

## 2024-04-18 NOTE — Assessment & Plan Note (Signed)
 pT2N1aM0, stage IB, ER+/PR+, HER2-, Grade II -Diagnosed in 08/2019. S/p right lumpectomy and SLNB with Dr. Carolynne Edouard on 09/24/19. 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  -To reduce her high risk she completed adjuvant chemo AC-T chemo on 04/04/20 and adjuvant Radiation in [facility] 04/21/20-06/18/20.  Port removed 09/2021. -She started antiestrogen therapy with Letrozole from 12/2020. She has tolerated moderately well with weight gain and hot flashes.  -currently on gabapentin to help hot flashes.   Genetic testing -negative except VUS of BRCA1 c.2048A>G

## 2024-04-18 NOTE — Progress Notes (Unsigned)
 Patient Care Team: Leisa Purchase, MD as PCP - General (Family Medicine) Auther Bo, RN as Oncology Nurse Navigator Alane Hsu, RN as Oncology Nurse Navigator Caralyn Chandler, MD as Consulting Physician (General Surgery) Sonja Trenton, MD as Consulting Physician (Hematology) Colie Dawes, MD as Attending Physician (Radiation Oncology) Glynis Lass, MD as Referring Physician Burton, Lacie K, NP as Nurse Practitioner (Nurse Practitioner)  Clinic Day:  04/19/2024  Referring physician: Leisa Purchase, MD  ASSESSMENT & PLAN:   Assessment & Plan: Malignant neoplasm of upper-outer quadrant of right breast in female, estrogen receptor positive (HCC) pT2N1aM0, stage IB, ER+/PR+, HER2-, Grade II -Diagnosed in 08/2019. S/p right lumpectomy and SLNB with Dr. Alethea Andes on 09/24/19. 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  -To reduce her high risk she completed adjuvant chemo AC-T chemo on 04/04/20 and adjuvant Radiation in [facility] 04/21/20-06/18/20.  Port removed 09/2021. -She started antiestrogen therapy with Letrozole  from 12/2020. She has tolerated moderately well with weight gain and hot flashes.  -Letrozole  in January 2025 due to cramping in hands and legs. -Exemestane  started in March 2025.  Tolerating much better than letrozole . -currently on gabapentin  to help hot flashes.  - 02/2024 -nine 3D diagnostic mammogram.  Repeat in April 2026.   - Labs with follow-up in 6 months, sooner if needed. Genetic testing -negative except VUS of BRCA1 c.2048A>G     Hot flashes Continue gabapentin  as prescribed.  Consider use of topical peppermint oil to help with cooling sensation.  Consider addition of Effexor in the future if needed.  Plan Labs reviewed. -Mild and stable anemia with Hgb 11.9 and HCT 36.0.  Unremarkable CMP. Patient reports doing much better on exemestane .  Denies muscle cramps and joint pain. Continue exemestane  25 mg  daily. Continue gabapentin  as prescribed to improve hot flashes. Reviewed benign diagnostic mammogram from April 2025.  Recall in April 2026. Labs with follow-up in 6 months, sooner if needed. The patient understands the plans discussed today and is in agreement with them.  She knows to contact our office if she develops concerns prior to her next appointment.  I provided 25 minutes of face-to-face time during this encounter and > 50% was spent counseling as documented under my assessment and plan.    Sharyon Deis, NP  [facility] - A DEPT OF [facility]. [facility] [address] [place] [address] Dept: [phone] Dept Fax: [phone]   No orders of the defined types were placed in this encounter.     CHIEF COMPLAINT:  CC: right breast cancer, estrogen receptor positive   Current Treatment:  letrozole  2.5 mg daily starting 12/2020. Changed to exemestane  2.5 mg on 01/24/2024.   INTERVAL HISTORY:  Paula Davidson is here today for repeat clinical assessment. She was last seen by me 01/24/2024. Letrozole  changed to exemestane  at last visit. Had stopped letrozole  due to joint pain and weight gain.  States she has tolerated exemestane  much better than letrozole .  No longer having joint pain.  Has continued her weight loss journey with additional 2 pounds of weight loss since last visit.  She denies fevers or chills. She denies pain. She had 3D diagnostic mammogram done 01/02/2024. Resuts were benign. She should have repeat diangostic mammogram in 1 year.  She does report moderate hot flashes which are intermittent.  Does take gabapentin  at night which helps her sleep without waking up  due to night sweats.  She denies chest pain, chest pressure, or shortness of breath. She denies headaches or visual disturbances. She denies abdominal pain, nausea, vomiting, or changes in bowel or bladder habits.  Her appetite is good. Her weight has decreased 2  pounds over last 4 months.  I have reviewed the past medical history, past surgical history, social history and family history with the patient and they are unchanged from previous note.  ALLERGIES:  is allergic to hydrochlorothiazide, codeine, and lisinopril.  MEDICATIONS:  Current Outpatient Medications  Medication Sig Dispense Refill   amLODipine  (NORVASC ) 10 MG tablet Take 1 tablet by mouth daily.     Bioflavonoid Products (ESTER C PO) Take 1 tablet by mouth daily.     cetirizine (ZYRTEC) 10 MG tablet Take 10 mg by mouth daily as needed for allergies.     citalopram  (CELEXA ) 20 MG tablet Take 20 mg by mouth daily.     exemestane  (AROMASIN ) 25 MG tablet Take 1 tablet (25 mg total) by mouth daily after breakfast. 90 tablet 0   gabapentin  (NEURONTIN ) 100 MG capsule TAKE 3 CAPSULES(300 MG) BY MOUTH AT BEDTIME 90 capsule 1   losartan  (COZAAR ) 100 MG tablet Take 100 mg by mouth daily.     Multiple Vitamin (MULTIVITAMIN WITH MINERALS) TABS tablet Take 1 tablet by mouth daily.     traZODone  (DESYREL ) 100 MG tablet TAKE 1 TABLET(100 MG) BY MOUTH AT BEDTIME 30 tablet 0   Calcium Carbonate-Vitamin D (CALCIUM + D PO) Take 1 tablet by mouth daily.  (Patient not taking: Reported on 04/19/2024)     cholecalciferol (VITAMIN D) 25 MCG (1000 UT) tablet Take 1,000 Units by mouth daily. (Patient not taking: Reported on 04/19/2024)     No current facility-administered medications for this visit.    HISTORY OF PRESENT ILLNESS:   Oncology History Overview Note  Cancer Staging Malignant neoplasm of upper-outer quadrant of right breast in female, estrogen receptor positive (HCC) Staging form: Breast, AJCC 8th Edition - Clinical stage from 08/28/2019: Stage IB (cT2, cN0, cM0, G2, ER+, PR+, HER2-) - Signed by Sonja Fillmore, MD on 09/04/2019 - Pathologic stage from 09/24/2019: Stage IB (pT2, pN1a, cM0, G2, ER+, PR+, HER2-) - Signed by Sonja [place], MD on 10/10/2019    Malignant neoplasm of upper-outer quadrant of  right breast in female, estrogen receptor positive (HCC)  08/24/2019 Mammogram   Diagnostic mammogram and US  08/24/19 IMPRESSION: Suspicious right breast mass 10 o'clock 1 cm from the nipple measuring 3.3 x 1.9 x 1.9 cm and axillary adenopathy.   08/28/2019 Initial Biopsy   Diagnosis 08/28/19 1. Breast, right, needle core biopsy, 10 o'clock - INVASIVE MAMMARY CARCINOMA, GRADE II. - SEE MICROSCOPIC DESCRIPTION. 2. Lymph node, needle/core biopsy, right axilla - BENIGN LYMPH NODE. - NO METASTATIC CARCINOMA IDENTIFIED.    08/28/2019 Receptors her2   Results: IMMUNOHISTOCHEMICAL AND MORPHOMETRIC ANALYSIS PERFORMED MANUALLY The tumor cells are NEGATIVE for Her2 (1+). Estrogen Receptor: 100%, POSITIVE, STRONG STAINING INTENSITY Progesterone Receptor: 100%, POSITIVE, STRONG STAINING INTENSITY Proliferation Marker Ki67: 10%   08/28/2019 Cancer Staging   Staging form: Breast, AJCC 8th Edition - Clinical stage from 08/28/2019: Stage IB (cT2, cN0, cM0, G2, ER+, PR+, HER2-) - Signed by Sonja Duryea, MD on 09/04/2019   08/31/2019 Initial Diagnosis   Malignant neoplasm of upper-outer quadrant of right breast in female, estrogen receptor positive (HCC)   09/07/2019 Breast MRI   Breast MRI 09/07/19  IMPRESSION: 1. 2.5 cm known malignancy in the anterior right breast, superficial  depth near the nipple. 2. 7-8 mm indeterminate mass in the central upper outer quadrant of the right breast. 3. One right axillary lymph node with apparent mild cortical thickening, which lies slightly anterior and inferior to the biopsied right axillary lymph node ( benign reactive on pathology). This is most likely also benign given the pathology from the adjacent biopsied lymph node. 4. No evidence of left breast malignancy.   09/17/2019 Imaging   US  right breast 09/17/19  IMPRESSION: Several benign appearing masses in the upper outer quadrant of the right breast without a definite correlate to the finding seen  on prior MRI. The right axillary lymph nodes appear similar to the recently biopsied benign lymph node.   09/20/2019 Pathology Results   Diagnosis 09/20/19  Breast, right, needle core biopsy, upper outer quadrant - FIBROADENOMA - NO MALIGNANCY IDENTIFIED    09/21/2019 Genetic Testing   Negative genetic testing:  No pathogenic variants detected on the [facility] Breast Cancer STAT panel and Common Hereditary Cancers panel.  A variant of uncertain significance was identified in the BRCA1 gene, called c.2048A>G ([id number]).  The report date is 09/21/2019.  The STAT Breast cancer panel offered by [facility] includes sequencing and rearrangement analysis for the following 9 genes:  ATM, BRCA1, BRCA2, CDH1, CHEK2, PALB2, PTEN, STK11 and TP53.   The Common Hereditary Cancers Panel offered by [facility] includes sequencing and/or deletion duplication testing of the following 48 genes: APC, ATM, AXIN2, BARD1, BMPR1A, BRCA1, BRCA2, BRIP1, CDH1, CDK4, CDKN2A (p14ARF), CDKN2A (p16INK4a), CHEK2, CTNNA1, DICER1, EPCAM (Deletion/duplication testing only), GREM1 (promoter region deletion/duplication testing only), KIT, MEN1, MLH1, MSH2, MSH3, MSH6, MUTYH, NBN, NF1, NHTL1, PALB2, PDGFRA, PMS2, POLD1, POLE, PTEN, RAD50, RAD51C, RAD51D, RNF43, SDHB, SDHC, SDHD, SMAD4, SMARCA4. STK11, TP53, TSC1, TSC2, and VHL.  The following genes were evaluated for sequence changes only: SDHA and HOXB13 c.251G>A variant only.    09/24/2019 Surgery   RIGHT BREAST LUMPECTOMY WITH SENTINEL LYMPH NODE BIOPSY by Dr Alethea Andes  09/24/19    09/24/2019 Pathology Results   FINAL MICROSCOPIC DIAGNOSIS: 09/24/19   A. LYMPH NODE, RIGHT #1, SENTINEL, BIOPSY:  - There is no evidence of carcinoma in 1 of 1 lymph node (0/1).   B. LYMPH NODE, RIGHT, SENTINEL, BIOPSY:  - There is no evidence of carcinoma in 1 of 1 lymph node (0/1).   C. 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   D. LYMPH NODE, RIGHT #2,  SENTINEL, BIOPSY:  - There is no evidence of carcinoma in 1 of 1 lymph node (0/1).   E. LYMPH NODE, RIGHT #3 , SENTINEL, BIOPSY:  - There is no evidence of carcinoma in 1 of 1 lymph node (0/1).   F. LYMPH NODE, RIGHT, SENTINEL, BIOPSY:  - There is no evidence of carcinoma in 1 of 1 lymph node (0/1).   G. LYMPH NODE, RIGHT, SENTINEL, BIOPSY:  -There is no evidence of carcinoma in 1 of 1 lymph node (0/1).   H. 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  - Ductal carcinoma in situ, intermediate grade.  - Perineural invasion is identified.  - The surgical resection margins are negative for carcinoma.  - See oncology table below   I. BREAST, RIGHT ADDITIONAL INFERIOR MARGIN, EXCISION:  - Fibrocystic changes.  - There is no evidence of malignancy.     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 Postoperative findings in the right breast and axilla. Upper normal sized right axillary lymph nodes, nonspecific. 2. 2 mm superior apical segment right upper lobe nodule is technically nonspecific although statistically likely to be benign. 3. 3.6 cm hypodense right adnexal structure, probably a cyst but technically nonspecific. If patient is premenopausal, no follow up is warranted; if early postmenopausal, follow up ultrasound in  6-12 months would be recommended. 4. Other imaging findings of potential clinical significance: Small type 1 hiatal hernia. Descending and sigmoid colon diverticulosis. Small indirect left inguinal hernia contains adipose tissue.   Aortic Atherosclerosis (ICD10-I70.0).   10/26/2019 Imaging   Whole Body Bone scan 10/26/19  IMPRESSION: Nonspecific sites of uptake at the RIGHT ischium and anterior LEFT sixth rib, cannot exclude metastatic foci.   Otherwise negative exam.     10/29/2019 - 04/04/2020 Chemotherapy   Adjuvant AC q2weeks for 4 cycles starting 10/29/19-12/21/19 followed by weekly Taxol  for 12 weeks starting 01/11/20. Taxol  reduced 30% starting with C6. Completed on 04/04/20   03/24/2020 Breast MRI   IMPRESSION: 1. Postsurgical changes in the right breast and right axilla. 2. No MRI evidence of malignancy in either breast.   RECOMMENDATION: Diagnostic bilateral mammogram in September 2021.   BI-RADS CATEGORY  2: Benign.   04/21/2020 - 06/18/2020 Radiation Therapy   adjuvant Radiation in [facility] 04/21/20-06/18/20 with Georgi Kinsman   12/2020 -  Anti-estrogen oral therapy   Letrozole  2.5mg  once daily starting 12/2020   01/02/2024 Mammogram   3D diagnostic mammogram IMPRESSION: No evidence of malignancy in either breast.  RECOMMENDATION: Per protocol, as the patient is now 2 or more years status post lumpectomy, she may return to annual screening mammography in 1 year. However, given the history of breast cancer, the patient remains eligible for annual diagnostic mammography if preferred.   BI-RADS CATEGORY  2: Benign.         REVIEW OF SYSTEMS:   Constitutional: Denies fevers, chills or abnormal weight loss Eyes: Denies blurriness of vision Ears, nose, mouth, throat, and face: Denies mucositis or sore throat Respiratory: Denies cough, dyspnea or wheezes Cardiovascular: Denies palpitation, chest discomfort or lower extremity swelling Gastrointestinal:  Denies nausea,  heartburn or change in bowel habits Skin: Denies abnormal skin rashes Lymphatics: Denies new lymphadenopathy or easy bruising Neurological:Denies numbness, tingling or new weaknesses Behavioral/Psych: Mood is stable, no new changes  All other systems were reviewed with the patient and are negative.   VITALS:   Today's Vitals   04/19/24 0846 04/19/24 0901  BP: 138/78   Pulse: 70   Resp: 17   Temp: 97.8 F (36.6 C)   SpO2: 99%   Weight: 249 lb 12.8 oz (113.3 kg)   PainSc:  0-No pain   Body mass index is 40.32 kg/m.    Wt Readings from Last 3 Encounters:  04/19/24 249 lb 12.8 oz (113.3 kg)  01/24/24 251 lb 8 oz (  114.1 kg)  06/30/23 287 lb 1.6 oz (130.2 kg)    Body mass index is 40.32 kg/m.  Performance status (ECOG): 1 - Symptomatic but completely ambulatory  PHYSICAL EXAM:   GENERAL:alert, no distress and comfortable SKIN: skin color, texture, turgor are normal, no rashes or significant lesions EYES: normal, Conjunctiva are pink and non-injected, sclera clear OROPHARYNX:no exudate, no erythema and lips, buccal mucosa, and tongue normal  NECK: supple, thyroid normal size, non-tender, without nodularity LYMPH:  no palpable lymphadenopathy in the cervical, axillary or inguinal LUNGS: clear to auscultation and percussion with normal breathing effort HEART: regular rate & rhythm and no murmurs and no lower extremity edema ABDOMEN:abdomen soft, non-tender and normal bowel sounds Musculoskeletal:no cyanosis of digits and no clubbing  NEURO: alert & oriented x 3 with fluent speech, no focal motor/sensory deficits BREAST: Well-healed horizontal lumpectomy scar lumpectomy scar along outer aspect of left breast.  No new palpable lumps or masses noted today.  No nipple inversion or nipple discharge.  There is well-healed surgical scar in left axillary region without axillary lymphadenopathy.  The right breast is without lumps or masses.  There is no nipple inversion or nipple  discharge.  There is no axillary lymphadenopathy on the right side.  LABORATORY DATA:  I have reviewed the data as listed    Component Value Date/Time   NA 139 04/19/2024 0824   K 3.8 04/19/2024 0824   CL 105 04/19/2024 0824   CO2 29 04/19/2024 0824   GLUCOSE 91 04/19/2024 0824   BUN 9 04/19/2024 0824   CREATININE 0.82 04/19/2024 0824   CALCIUM 9.6 04/19/2024 0824   PROT 7.9 04/19/2024 0824   ALBUMIN 4.1 04/19/2024 0824   AST 15 04/19/2024 0824   ALT 16 04/19/2024 0824   ALKPHOS 73 04/19/2024 0824   BILITOT 0.6 04/19/2024 0824   GFRNONAA >60 04/19/2024 0824   GFRAA >60 06/27/2020 0947     Lab Results  Component Value Date   WBC 6.0 04/19/2024   NEUTROABS 3.2 04/19/2024   HGB 11.9 (L) 04/19/2024   HCT 36.0 04/19/2024   MCV 77.3 (L) 04/19/2024   PLT 403 (H) 04/19/2024

## 2024-04-19 ENCOUNTER — Encounter: Payer: Self-pay | Admitting: Nurse Practitioner

## 2024-04-19 ENCOUNTER — Inpatient Hospital Stay (HOSPITAL_BASED_OUTPATIENT_CLINIC_OR_DEPARTMENT_OTHER): Admitting: Nurse Practitioner

## 2024-04-19 ENCOUNTER — Inpatient Hospital Stay: Attending: Nurse Practitioner

## 2024-04-19 VITALS — BP 138/78 | HR 70 | Temp 97.8°F | Resp 17 | Wt 249.8 lb

## 2024-04-19 DIAGNOSIS — D649 Anemia, unspecified: Secondary | ICD-10-CM | POA: Insufficient documentation

## 2024-04-19 DIAGNOSIS — Z1732 Human epidermal growth factor receptor 2 negative status: Secondary | ICD-10-CM | POA: Diagnosis not present

## 2024-04-19 DIAGNOSIS — C50411 Malignant neoplasm of upper-outer quadrant of right female breast: Secondary | ICD-10-CM

## 2024-04-19 DIAGNOSIS — Z923 Personal history of irradiation: Secondary | ICD-10-CM | POA: Insufficient documentation

## 2024-04-19 DIAGNOSIS — R232 Flushing: Secondary | ICD-10-CM | POA: Insufficient documentation

## 2024-04-19 DIAGNOSIS — Z1721 Progesterone receptor positive status: Secondary | ICD-10-CM | POA: Diagnosis not present

## 2024-04-19 DIAGNOSIS — Z79811 Long term (current) use of aromatase inhibitors: Secondary | ICD-10-CM | POA: Diagnosis not present

## 2024-04-19 DIAGNOSIS — Z9221 Personal history of antineoplastic chemotherapy: Secondary | ICD-10-CM | POA: Diagnosis not present

## 2024-04-19 DIAGNOSIS — Z170 Estrogen receptor positive status [ER+]: Secondary | ICD-10-CM

## 2024-04-19 LAB — CMP (CANCER CENTER ONLY)
ALT: 16 U/L (ref 0–44)
AST: 15 U/L (ref 15–41)
Albumin: 4.1 g/dL (ref 3.5–5.0)
Alkaline Phosphatase: 73 U/L (ref 38–126)
Anion gap: 5 (ref 5–15)
BUN: 9 mg/dL (ref 6–20)
CO2: 29 mmol/L (ref 22–32)
Calcium: 9.6 mg/dL (ref 8.9–10.3)
Chloride: 105 mmol/L (ref 98–111)
Creatinine: 0.82 mg/dL (ref 0.44–1.00)
GFR, Estimated: >60 mL/min (ref >60)
Glucose, Bld: 91 mg/dL (ref 70–99)
Potassium: 3.8 mmol/L (ref 3.5–5.1)
Sodium: 139 mmol/L (ref 135–145)
Total Bilirubin: 0.6 mg/dL (ref 0.0–1.2)
Total Protein: 7.9 g/dL (ref 6.5–8.1)

## 2024-04-19 LAB — CBC WITH DIFFERENTIAL (CANCER CENTER ONLY)
Abs Immature Granulocytes: 0.01 10*3/uL (ref 0.00–0.07)
Basophils Absolute: 0.1 10*3/uL (ref 0.0–0.1)
Basophils Relative: 1 %
Eosinophils Absolute: 0.5 10*3/uL (ref 0.0–0.5)
Eosinophils Relative: 8 %
HCT: 36 % (ref 36.0–46.0)
Hemoglobin: 11.9 g/dL — ABNORMAL LOW (ref 12.0–15.0)
Immature Granulocytes: 0 %
Lymphocytes Relative: 28 %
Lymphs Abs: 1.7 10*3/uL (ref 0.7–4.0)
MCH: 25.5 pg — ABNORMAL LOW (ref 26.0–34.0)
MCHC: 33.1 g/dL (ref 30.0–36.0)
MCV: 77.3 fL — ABNORMAL LOW (ref 80.0–100.0)
Monocytes Absolute: 0.6 10*3/uL (ref 0.1–1.0)
Monocytes Relative: 9 %
Neutro Abs: 3.2 10*3/uL (ref 1.7–7.7)
Neutrophils Relative %: 54 %
Platelet Count: 403 10*3/uL — ABNORMAL HIGH (ref 150–400)
RBC: 4.66 MIL/uL (ref 3.87–5.11)
RDW: 15.6 % — ABNORMAL HIGH (ref 11.5–15.5)
WBC Count: 6 10*3/uL (ref 4.0–10.5)
nRBC: 0 % (ref 0.0–0.2)

## 2024-05-04 ENCOUNTER — Other Ambulatory Visit: Payer: Self-pay | Admitting: Nurse Practitioner

## 2024-05-04 DIAGNOSIS — Z170 Estrogen receptor positive status [ER+]: Secondary | ICD-10-CM

## 2024-08-02 ENCOUNTER — Other Ambulatory Visit: Payer: Self-pay | Admitting: Nurse Practitioner

## 2024-08-02 DIAGNOSIS — C50411 Malignant neoplasm of upper-outer quadrant of right female breast: Secondary | ICD-10-CM

## 2024-10-16 ENCOUNTER — Other Ambulatory Visit: Payer: Self-pay

## 2024-10-16 DIAGNOSIS — Z170 Estrogen receptor positive status [ER+]: Secondary | ICD-10-CM

## 2024-10-17 NOTE — Assessment & Plan Note (Addendum)
 pT2N1aM0, stage IB, ER+/PR+, HER2-, Grade II -Diagnosed in 08/2019. S/p right lumpectomy and SLNB with Dr. Curvin on 09/24/19. 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  -To reduce her high risk she completed adjuvant chemo AC-T chemo on 04/04/20 and adjuvant Radiation in [facility] 04/21/20-06/18/20.  Port removed 09/2021. -She started antiestrogen therapy with Letrozole  from 12/2020. She has tolerated moderately well with weight gain and hot flashes.  -Letrozole  in January 2025 due to cramping in hands and legs. -Exemestane  started in March 2025.  Tolerating much better than letrozole . -currently on gabapentin  to help hot flashes.  - 12/2023 - 3D diagnostic mammogram.  Repeat in February 2026.  This was ordered as part of today's visit. -10/18/2024 -DEXA scan ordered as part of today's visit.  Previous DEXA scan done in 2022 was normal. -Continue exemestane  daily. - Labs with follow-up in 6 months, sooner if needed.  Genetic testing -negative except VUS of BRCA1 c.2048A>G

## 2024-10-17 NOTE — Progress Notes (Unsigned)
 Patient Care Team: Darell Barefoot, MD as PCP - General (Family Medicine) [name], RN as Oncology Nurse Navigator Curvin Deward MOULD, MD as Consulting Physician (General Surgery) Lanny Callander, MD as Consulting Physician (Hematology) Izell Domino, MD as Attending Physician (Radiation Oncology) Shana Rudell Paula Benjamen, MD as Referring Physician Burton, Lacie K, NP as Nurse Practitioner (Nurse Practitioner)  Clinic Day:  10/18/2024  Referring physician: Darell Barefoot, MD  ASSESSMENT & PLAN:   Assessment & Plan: Malignant neoplasm of upper-outer quadrant of right breast in female, estrogen receptor positive (HCC) pT2N1aM0, stage IB, ER+/PR+, HER2-, Grade II -Diagnosed in 08/2019. S/p right lumpectomy and SLNB with Dr. Curvin on 09/24/19. 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  -To reduce her high risk she completed adjuvant chemo AC-T chemo on 04/04/20 and adjuvant Radiation in [facility] 04/21/20-06/18/20.  Port removed 09/2021. -She started antiestrogen therapy with Letrozole  from 12/2020. She has tolerated moderately well with weight gain and hot flashes.  -Letrozole  in January 2025 due to cramping in hands and legs. -Exemestane  started in March 2025.  Tolerating much better than letrozole . -currently on gabapentin  to help hot flashes.  - 12/2023 - 3D diagnostic mammogram.  Repeat in February 2026.  This was ordered as part of today's visit. -10/18/2024 -DEXA scan ordered as part of today's visit.  Previous DEXA scan done in 2022 was normal. -Continue exemestane  daily. - Labs with follow-up in 6 months, sooner if needed.  Genetic testing -negative except VUS of BRCA1 [id number]      Bone health Most recent DEXA scan from 10/01/2021.  Her bone density was normal.  She currently takes calcium and vitamin D.  She is active with weightbearing activities.  No new DEXA scan ordered today for surveillance.  Right breast cancer, ER + Patient  is currently on exemestane  daily.  Tolerating well.  She does report having hot flashes and night sweats.  These are manageable.  She takes gabapentin  to help manage hot flashes.  This is slightly effective at night.  Most recent 3D diagnostic mammogram done 01/02/2024.  There is no evidence of malignancy.  She does have breast density category C.  Recommendation for repeat mammogram in February 2026.  This was ordered as part of today's visit.  Breast exam benign.  Difficulty sleeping This is likely multifactorial.  Suspect stress and hormonal changes having the biggest effects.  Will have her try increasing trazodone  to 150 mg every evening as needed.  She is currently on 100 mg which does not seem to be helping though was initially beneficial.  Plan Labs reviewed. - Mild and stable anemia with Hgb 11.5 and HCT 35.1. - CMP is unremarkable. DEXA scan ordered.  Prior bone density from 2022 was normal. 3D diagnostic mammogram ordered for February 2026 as part of today's visit. Continue exemestane  daily. Plan for labs and follow-up in 6 months, sooner if needed.  The patient understands the plans discussed today and is in agreement with them.  She knows to contact our office if she develops concerns prior to her next appointment.  I provided 25 minutes of face-to-face time during this encounter and > 50% was spent counseling as documented under my assessment and plan.    Paula FORBES Lessen, NP  [facility] - A DEPT OF [facility] [address] [place] [address] Dept: [phone] Dept Fax: [phone]   Orders Placed  This Encounter  Procedures   MM DIAG BREAST TOMO BILATERAL    Standing Status:   Future    Expected Date:   01/02/2025    Expiration Date:   10/18/2025    Reason for Exam (SYMPTOM  OR DIAGNOSIS REQUIRED):   breast cancer follow up, screening for breast cancer    Is the patient pregnant?:   No    Preferred  imaging location?:   [facility]   DG Bone Density    Standing Status:   Future    Expected Date:   11/18/2024    Expiration Date:   10/18/2025    Reason for Exam (SYMPTOM  OR DIAGNOSIS REQUIRED):   estrogen deficiency, on exemestane     Is patient pregnant?:   No    Preferred imaging location?:   [facility]      CHIEF COMPLAINT:  CC: Right breast cancer, ER +  Current Treatment: Exemestane  25 mg started 01/24/2024 (antiestrogen therapy started 12/2020)  INTERVAL HISTORY:  Sacheen is here today for repeat clinical assessment.  She last saw me on 04/19/2024.  Diagnostic mammogram to be done in 12/2024.  Her last DEXA scan was done in 2022.  This was normal.  She is overdue for repeat imaging.  She continues exemestane  daily.  She does have flashes and night sweats which are manageable.  She takes gabapentin  which is helpful.  She does have some tenderness along the surgical scars under the right arm and along the outer aspect of the right breast.  She has not noticed any new lumps or masses.  Can feel fibrous scar tissue during breast exams.  This is unchanged.  No changes, lumps, or masses noted in the left breast.  She does have difficulty sleeping.  She states hot flashes and night sweats will wake her up during the night.  Difficult to go back to sleep.  Taking trazodone  100 mg to help with sleep.  This used to be helpful.  She can fall asleep, but staying asleep is an issue.  She denies chest pain, chest pressure, or shortness of breath. She denies headaches or visual disturbances. She denies abdominal pain, nausea, vomiting, or changes in bowel or bladder habits.   She denies fevers or chills. She denies pain. Her appetite is good. Her weight has increased 16 pounds over last 6 months.  I have reviewed the past medical history, past surgical history, social history and family history with the patient and they are unchanged from previous note.  ALLERGIES:  is allergic to  hydrochlorothiazide, codeine, and lisinopril.  MEDICATIONS:  Current Outpatient Medications  Medication Sig Dispense Refill   amLODipine  (NORVASC ) 10 MG tablet Take 1 tablet by mouth daily.     Bioflavonoid Products (ESTER C PO) Take 1 tablet by mouth daily.     Calcium Carbonate-Vitamin D (CALCIUM + D PO) Take 1 tablet by mouth daily.      cetirizine (ZYRTEC) 10 MG tablet Take 10 mg by mouth daily as needed for allergies.     cholecalciferol (VITAMIN D) 25 MCG (1000 UT) tablet Take 1,000 Units by mouth daily.     citalopram  (CELEXA ) 20 MG tablet Take 20 mg by mouth daily.     exemestane  (AROMASIN ) 25 MG tablet TAKE 1 TABLET BY MOUTH ONCE DAILY AFTER BREAKFAST 90 tablet 1   losartan  (COZAAR ) 100 MG tablet Take 100 mg by mouth daily.     Multiple Vitamin (MULTIVITAMIN WITH MINERALS) TABS tablet Take 1 tablet by mouth daily.  gabapentin  (NEURONTIN ) 100 MG capsule TAKE 3 CAPSULES(300 MG) BY MOUTH AT BEDTIME 270 capsule 1   traZODone  (DESYREL ) 100 MG tablet Take 1.5 tablets (150 mg total) by mouth at bedtime. 135 tablet 1   No current facility-administered medications for this visit.    HISTORY OF PRESENT ILLNESS:   Oncology History Overview Note  Cancer Staging Malignant neoplasm of upper-outer quadrant of right breast in female, estrogen receptor positive (HCC) Staging form: Breast, AJCC 8th Edition - Clinical stage from 08/28/2019: Stage IB (cT2, cN0, cM0, G2, ER+, PR+, HER2-) - Signed by Lanny Callander, MD on 09/04/2019 - Pathologic stage from 09/24/2019: Stage IB (pT2, pN1a, cM0, G2, ER+, PR+, HER2-) - Signed by Lanny Callander, MD on 10/10/2019    Malignant neoplasm of upper-outer quadrant of right breast in female, estrogen receptor positive (HCC)  08/24/2019 Mammogram   Diagnostic mammogram and US  08/24/19 IMPRESSION: Suspicious right breast mass 10 o'clock 1 cm from the nipple measuring 3.3 x 1.9 x 1.9 cm and axillary adenopathy.   08/28/2019 Initial Biopsy   Diagnosis 08/28/19 1.  Breast, right, needle core biopsy, 10 o'clock - INVASIVE MAMMARY CARCINOMA, GRADE II. - SEE MICROSCOPIC DESCRIPTION. 2. Lymph node, needle/core biopsy, right axilla - BENIGN LYMPH NODE. - NO METASTATIC CARCINOMA IDENTIFIED.    08/28/2019 Receptors her2   Results: IMMUNOHISTOCHEMICAL AND MORPHOMETRIC ANALYSIS PERFORMED MANUALLY The tumor cells are NEGATIVE for Her2 (1+). Estrogen Receptor: 100%, POSITIVE, STRONG STAINING INTENSITY Progesterone Receptor: 100%, POSITIVE, STRONG STAINING INTENSITY Proliferation Marker Ki67: 10%   08/28/2019 Cancer Staging   Staging form: Breast, AJCC 8th Edition - Clinical stage from 08/28/2019: Stage IB (cT2, cN0, cM0, G2, ER+, PR+, HER2-) - Signed by Lanny Callander, MD on 09/04/2019   08/31/2019 Initial Diagnosis   Malignant neoplasm of upper-outer quadrant of right breast in female, estrogen receptor positive (HCC)   09/07/2019 Breast MRI   Breast MRI 09/07/19  IMPRESSION: 1. 2.5 cm known malignancy in the anterior right breast, superficial depth near the nipple. 2. 7-8 mm indeterminate mass in the central upper outer quadrant of the right breast. 3. One right axillary lymph node with apparent mild cortical thickening, which lies slightly anterior and inferior to the biopsied right axillary lymph node ( benign reactive on pathology). This is most likely also benign given the pathology from the adjacent biopsied lymph node. 4. No evidence of left breast malignancy.   09/17/2019 Imaging   US  right breast 09/17/19  IMPRESSION: Several benign appearing masses in the upper outer quadrant of the right breast without a definite correlate to the finding seen on prior MRI. The right axillary lymph nodes appear similar to the recently biopsied benign lymph node.   09/20/2019 Pathology Results   Diagnosis 09/20/19  Breast, right, needle core biopsy, upper outer quadrant - FIBROADENOMA - NO MALIGNANCY IDENTIFIED    09/21/2019 Genetic Testing   Negative  genetic testing:  No pathogenic variants detected on the [facility] Breast Cancer STAT panel and Common Hereditary Cancers panel.  A variant of uncertain significance was identified in the BRCA1 gene, called c.2048A>G ([id number]).  The report date is 09/21/2019.  The STAT Breast cancer panel offered by [facility] includes sequencing and rearrangement analysis for the following 9 genes:  ATM, BRCA1, BRCA2, CDH1, CHEK2, PALB2, PTEN, STK11 and TP53.   The Common Hereditary Cancers Panel offered by [facility] includes sequencing and/or deletion duplication testing of the following 48 genes: APC, ATM, AXIN2, BARD1, BMPR1A, BRCA1, BRCA2, BRIP1, CDH1, CDK4, CDKN2A (p14ARF), CDKN2A (p16INK4a), CHEK2, CTNNA1, DICER1,  EPCAM (Deletion/duplication testing only), GREM1 (promoter region deletion/duplication testing only), KIT, MEN1, MLH1, MSH2, MSH3, MSH6, MUTYH, NBN, NF1, NHTL1, PALB2, PDGFRA, PMS2, POLD1, POLE, PTEN, RAD50, RAD51C, RAD51D, RNF43, SDHB, SDHC, SDHD, SMAD4, SMARCA4. STK11, TP53, TSC1, TSC2, and VHL.  The following genes were evaluated for sequence changes only: SDHA and HOXB13 c.251G>A variant only.    09/24/2019 Surgery   RIGHT BREAST LUMPECTOMY WITH SENTINEL LYMPH NODE BIOPSY by Dr Curvin  09/24/19    09/24/2019 Pathology Results   FINAL MICROSCOPIC DIAGNOSIS: 09/24/19   A. LYMPH NODE, RIGHT #1, SENTINEL, BIOPSY:  - There is no evidence of carcinoma in 1 of 1 lymph node (0/1).   B. LYMPH NODE, RIGHT, SENTINEL, BIOPSY:  - There is no evidence of carcinoma in 1 of 1 lymph node (0/1).   C. 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   D. LYMPH NODE, RIGHT #2, SENTINEL, BIOPSY:  - There is no evidence of carcinoma in 1 of 1 lymph node (0/1).   E. LYMPH NODE, RIGHT #3 , SENTINEL, BIOPSY:  - There is no evidence of carcinoma in 1 of 1 lymph node (0/1).   F. LYMPH NODE, RIGHT, SENTINEL, BIOPSY:  - There is no evidence of carcinoma in 1 of 1 lymph node (0/1).    G. LYMPH NODE, RIGHT, SENTINEL, BIOPSY:  -There is no evidence of carcinoma in 1 of 1 lymph node (0/1).   H. 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  - Ductal carcinoma in situ, intermediate grade.  - Perineural invasion is identified.  - The surgical resection margins are negative for carcinoma.  - See oncology table below   I. BREAST, RIGHT ADDITIONAL INFERIOR MARGIN, EXCISION:  - Fibrocystic changes.  - There is no evidence of malignancy.     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 Postoperative findings in the right breast and axilla. Upper normal sized right axillary lymph nodes, nonspecific. 2. 2 mm superior apical segment right upper lobe nodule is technically nonspecific although statistically likely to be benign. 3. 3.6 cm hypodense right adnexal structure, probably a cyst but technically nonspecific. If patient is premenopausal, no follow up is warranted; if early postmenopausal, follow up ultrasound in 6-12 months would be recommended. 4. Other imaging findings of potential clinical significance: Small type 1 hiatal hernia. Descending and sigmoid colon diverticulosis. Small indirect left inguinal hernia contains adipose tissue.   Aortic Atherosclerosis (ICD10-I70.0).   10/26/2019 Imaging   Whole Body  Bone scan 10/26/19  IMPRESSION: Nonspecific sites of uptake at the RIGHT ischium and anterior LEFT sixth rib, cannot exclude metastatic foci.   Otherwise negative exam.     10/29/2019 - 04/04/2020 Chemotherapy   Adjuvant AC q2weeks for 4 cycles starting 10/29/19-12/21/19 followed by weekly Taxol   for 12 weeks starting 01/11/20. Taxol  reduced 30% starting with C6. Completed on 04/04/20   03/24/2020 Breast MRI   IMPRESSION: 1. Postsurgical changes in the right breast and right axilla. 2. No MRI evidence of malignancy in either breast.   RECOMMENDATION: Diagnostic bilateral mammogram in September 2021.   BI-RADS CATEGORY  2: Benign.   04/21/2020 - 06/18/2020 Radiation Therapy   adjuvant Radiation in [facility] 04/21/20-06/18/20 with Rudell Avers   12/2020 -  Anti-estrogen oral therapy   Letrozole  2.5mg  once daily starting 12/2020   01/02/2024 Mammogram   3D diagnostic mammogram IMPRESSION: No evidence of malignancy in either breast.  RECOMMENDATION: Per protocol, as the patient is now 2 or more years status post lumpectomy, she may return to annual screening mammography in 1 year. However, given the history of breast cancer, the patient remains eligible for annual diagnostic mammography if preferred.   BI-RADS CATEGORY  2: Benign.         REVIEW OF SYSTEMS:   Constitutional: Denies fevers, chills or abnormal weight loss Eyes: Denies blurriness of vision Ears, nose, mouth, throat, and face: Denies mucositis or sore throat Respiratory: Denies cough, dyspnea or wheezes Cardiovascular: Denies palpitation, chest discomfort or lower extremity swelling Gastrointestinal:  Denies nausea, heartburn or change in bowel habits Skin: Denies abnormal skin rashes Lymphatics: Denies new lymphadenopathy or easy bruising Neurological:Denies numbness, tingling or new weaknesses Behavioral/Psych: Mood is stable, no new changes  All other systems were reviewed with the patient and are  negative.   VITALS:   Today's Vitals   10/18/24 1003 10/18/24 1017  BP: 138/78   Pulse: 78   Resp: 17   Temp: 97.6 F (36.4 C)   SpO2: 99%   Weight: 265 lb 9.6 oz (120.5 kg)   PainSc:  0-No pain   Body mass index is 42.87 kg/m.   Wt Readings from Last 3 Encounters:  10/18/24 265 lb 9.6 oz (120.5 kg)  04/19/24 249 lb 12.8 oz (113.3 kg)  01/24/24 251 lb 8 oz (114.1 kg)    Body mass index is 42.87 kg/m.  Performance status (ECOG): 1 - Symptomatic but completely ambulatory  PHYSICAL EXAM:   GENERAL:alert, no distress and comfortable SKIN: skin color, texture, turgor are normal, no rashes or significant lesions EYES: normal, Conjunctiva are pink and non-injected, sclera clear OROPHARYNX:no exudate, no erythema and lips, buccal mucosa, and tongue normal  NECK: supple, thyroid normal size, non-tender, without nodularity LYMPH:  no palpable lymphadenopathy in the cervical, axillary or inguinal LUNGS: clear to auscultation and percussion with normal breathing effort HEART: regular rate & rhythm and no murmurs and no lower extremity edema ABDOMEN:abdomen soft, non-tender and normal bowel sounds Musculoskeletal:no cyanosis of digits and no clubbing  NEURO: alert & oriented x 3 with fluent speech, no focal motor/sensory deficits BREAST: There is well-healed left ectomy scar along the outer upper quadrant of the right breast.  Well-healed right axillary scar surgical scar also.  Fibrous tissue can be palpated underneath surgical scars.  No other palpable lumps or masses are noted during today's exam.  There is no nipple inversion or nipple discharge.  There is no axillary lymphadenopathy on the right.  There are no palpable lumps or masses noted in the left breast.  There is no nipple inversion or nipple discharge.  There is no axillary lymphadenopathy on the left.  LABORATORY DATA:  I have reviewed the data as listed    Component Value Date/Time   NA 138 10/18/2024 0932   K 4.0  10/18/2024 0932   CL 103 10/18/2024 0932   CO2 27 10/18/2024 0932   GLUCOSE 109 (H) 10/18/2024 0932   BUN 11 10/18/2024 0932   CREATININE 0.68 10/18/2024 0932   CALCIUM 9.6 10/18/2024 0932   PROT 7.9 10/18/2024 0932   ALBUMIN 4.2 10/18/2024 0932   AST 19 10/18/2024 0932   ALT 15 10/18/2024 0932   ALKPHOS 86 10/18/2024 0932   BILITOT 0.5 10/18/2024 0932   GFRNONAA >60 10/18/2024 0932   GFRAA >60 06/27/2020 0947     Lab Results  Component Value Date   WBC 5.6 10/18/2024   NEUTROABS 3.1 10/18/2024   HGB 11.5 (L) 10/18/2024   HCT 35.1 (L) 10/18/2024   MCV 78.2 (L) 10/18/2024   PLT 403 (H) 10/18/2024

## 2024-10-18 ENCOUNTER — Other Ambulatory Visit

## 2024-10-18 ENCOUNTER — Encounter: Payer: Self-pay | Admitting: Nurse Practitioner

## 2024-10-18 ENCOUNTER — Inpatient Hospital Stay: Attending: Nurse Practitioner

## 2024-10-18 ENCOUNTER — Inpatient Hospital Stay: Admitting: Nurse Practitioner

## 2024-10-18 ENCOUNTER — Ambulatory Visit: Admitting: Hematology

## 2024-10-18 VITALS — BP 138/78 | HR 78 | Temp 97.6°F | Resp 17 | Wt 265.6 lb

## 2024-10-18 DIAGNOSIS — R232 Flushing: Secondary | ICD-10-CM | POA: Diagnosis not present

## 2024-10-18 DIAGNOSIS — Z1732 Human epidermal growth factor receptor 2 negative status: Secondary | ICD-10-CM | POA: Diagnosis not present

## 2024-10-18 DIAGNOSIS — Z79811 Long term (current) use of aromatase inhibitors: Secondary | ICD-10-CM | POA: Insufficient documentation

## 2024-10-18 DIAGNOSIS — Z170 Estrogen receptor positive status [ER+]: Secondary | ICD-10-CM | POA: Insufficient documentation

## 2024-10-18 DIAGNOSIS — K4090 Unilateral inguinal hernia, without obstruction or gangrene, not specified as recurrent: Secondary | ICD-10-CM | POA: Diagnosis not present

## 2024-10-18 DIAGNOSIS — C50411 Malignant neoplasm of upper-outer quadrant of right female breast: Secondary | ICD-10-CM | POA: Insufficient documentation

## 2024-10-18 DIAGNOSIS — I700 Atherosclerosis of aorta: Secondary | ICD-10-CM | POA: Insufficient documentation

## 2024-10-18 DIAGNOSIS — C50412 Malignant neoplasm of upper-outer quadrant of left female breast: Secondary | ICD-10-CM | POA: Diagnosis not present

## 2024-10-18 DIAGNOSIS — E2839 Other primary ovarian failure: Secondary | ICD-10-CM

## 2024-10-18 DIAGNOSIS — R61 Generalized hyperhidrosis: Secondary | ICD-10-CM | POA: Insufficient documentation

## 2024-10-18 DIAGNOSIS — K449 Diaphragmatic hernia without obstruction or gangrene: Secondary | ICD-10-CM | POA: Diagnosis not present

## 2024-10-18 DIAGNOSIS — Z1721 Progesterone receptor positive status: Secondary | ICD-10-CM | POA: Insufficient documentation

## 2024-10-18 DIAGNOSIS — Z923 Personal history of irradiation: Secondary | ICD-10-CM | POA: Diagnosis not present

## 2024-10-18 DIAGNOSIS — K5730 Diverticulosis of large intestine without perforation or abscess without bleeding: Secondary | ICD-10-CM | POA: Insufficient documentation

## 2024-10-18 DIAGNOSIS — Z79899 Other long term (current) drug therapy: Secondary | ICD-10-CM | POA: Insufficient documentation

## 2024-10-18 DIAGNOSIS — Z9221 Personal history of antineoplastic chemotherapy: Secondary | ICD-10-CM | POA: Insufficient documentation

## 2024-10-18 LAB — CBC WITH DIFFERENTIAL (CANCER CENTER ONLY)
Abs Immature Granulocytes: 0.01 K/uL (ref 0.00–0.07)
Basophils Absolute: 0.1 K/uL (ref 0.0–0.1)
Basophils Relative: 1 %
Eosinophils Absolute: 0.5 K/uL (ref 0.0–0.5)
Eosinophils Relative: 9 %
HCT: 35.1 % — ABNORMAL LOW (ref 36.0–46.0)
Hemoglobin: 11.5 g/dL — ABNORMAL LOW (ref 12.0–15.0)
Immature Granulocytes: 0 %
Lymphocytes Relative: 28 %
Lymphs Abs: 1.6 K/uL (ref 0.7–4.0)
MCH: 25.6 pg — ABNORMAL LOW (ref 26.0–34.0)
MCHC: 32.8 g/dL (ref 30.0–36.0)
MCV: 78.2 fL — ABNORMAL LOW (ref 80.0–100.0)
Monocytes Absolute: 0.4 K/uL (ref 0.1–1.0)
Monocytes Relative: 8 %
Neutro Abs: 3.1 K/uL (ref 1.7–7.7)
Neutrophils Relative %: 54 %
Platelet Count: 403 K/uL — ABNORMAL HIGH (ref 150–400)
RBC: 4.49 MIL/uL (ref 3.87–5.11)
RDW: 15.6 % — ABNORMAL HIGH (ref 11.5–15.5)
WBC Count: 5.6 K/uL (ref 4.0–10.5)
nRBC: 0 % (ref 0.0–0.2)

## 2024-10-18 LAB — CMP (CANCER CENTER ONLY)
ALT: 15 U/L (ref 0–44)
AST: 19 U/L (ref 15–41)
Albumin: 4.2 g/dL (ref 3.5–5.0)
Alkaline Phosphatase: 86 U/L (ref 38–126)
Anion gap: 8 (ref 5–15)
BUN: 11 mg/dL (ref 6–20)
CO2: 27 mmol/L (ref 22–32)
Calcium: 9.6 mg/dL (ref 8.9–10.3)
Chloride: 103 mmol/L (ref 98–111)
Creatinine: 0.68 mg/dL (ref 0.44–1.00)
GFR, Estimated: >60 mL/min (ref >60)
Glucose, Bld: 109 mg/dL — ABNORMAL HIGH (ref 70–99)
Potassium: 4 mmol/L (ref 3.5–5.1)
Sodium: 138 mmol/L (ref 135–145)
Total Bilirubin: 0.5 mg/dL (ref 0.0–1.2)
Total Protein: 7.9 g/dL (ref 6.5–8.1)

## 2024-10-18 MED ORDER — TRAZODONE HCL 100 MG PO TABS: 150.0000 mg | ORAL_TABLET | Freq: Every day | ORAL | 1 refills | Status: AC

## 2024-10-18 MED ORDER — GABAPENTIN 100 MG PO CAPS: ORAL_CAPSULE | ORAL | 1 refills | Status: AC

## 2025-01-02 ENCOUNTER — Encounter

## 2025-04-18 ENCOUNTER — Inpatient Hospital Stay

## 2025-04-18 ENCOUNTER — Inpatient Hospital Stay: Admitting: Nurse Practitioner
